# Patient Record
Sex: Female | Born: 1945 | Race: White | Hispanic: No | Smoking: Never smoker
Health system: Southern US, Community
[De-identification: ages and names within clinical notes are randomized; demographics above are authoritative.]

## PROBLEM LIST (undated history)

## (undated) DIAGNOSIS — K219 Gastro-esophageal reflux disease without esophagitis: Secondary | ICD-10-CM

## (undated) DIAGNOSIS — E7800 Pure hypercholesterolemia, unspecified: Secondary | ICD-10-CM

## (undated) DIAGNOSIS — E119 Type 2 diabetes mellitus without complications: Secondary | ICD-10-CM

## (undated) DIAGNOSIS — I10 Essential (primary) hypertension: Secondary | ICD-10-CM

## (undated) DIAGNOSIS — J309 Allergic rhinitis, unspecified: Secondary | ICD-10-CM

## (undated) HISTORY — DX: Gastro-esophageal reflux disease without esophagitis: K21.9

## (undated) HISTORY — DX: Pure hypercholesterolemia, unspecified: E78.00

## (undated) HISTORY — DX: Allergic rhinitis, unspecified: J30.9

## (undated) HISTORY — DX: Type 2 diabetes mellitus without complications: E11.9

## (undated) HISTORY — DX: Essential (primary) hypertension: I10

---

## 1973-02-13 HISTORY — PX: TUBAL LIGATION: SHX77

## 1990-02-13 HISTORY — PX: GALLBLADDER SURGERY: SHX652

## 1998-01-18 ENCOUNTER — Other Ambulatory Visit: Admission: RE | Admit: 1998-01-18 | Discharge: 1998-01-18 | Payer: Self-pay | Admitting: Gynecology

## 1999-01-13 ENCOUNTER — Other Ambulatory Visit: Admission: RE | Admit: 1999-01-13 | Discharge: 1999-01-13 | Payer: Self-pay | Admitting: Gynecology

## 2000-01-16 ENCOUNTER — Other Ambulatory Visit: Admission: RE | Admit: 2000-01-16 | Discharge: 2000-01-16 | Payer: Self-pay | Admitting: Gynecology

## 2001-02-19 ENCOUNTER — Other Ambulatory Visit: Admission: RE | Admit: 2001-02-19 | Discharge: 2001-02-19 | Payer: Self-pay | Admitting: Gynecology

## 2002-03-04 ENCOUNTER — Other Ambulatory Visit: Admission: RE | Admit: 2002-03-04 | Discharge: 2002-03-04 | Payer: Self-pay | Admitting: Gynecology

## 2003-03-03 ENCOUNTER — Other Ambulatory Visit: Admission: RE | Admit: 2003-03-03 | Discharge: 2003-03-03 | Payer: Self-pay | Admitting: Gynecology

## 2004-03-03 ENCOUNTER — Other Ambulatory Visit: Admission: RE | Admit: 2004-03-03 | Discharge: 2004-03-03 | Payer: Self-pay | Admitting: Gynecology

## 2005-03-13 ENCOUNTER — Other Ambulatory Visit: Admission: RE | Admit: 2005-03-13 | Discharge: 2005-03-13 | Payer: Self-pay | Admitting: Gynecology

## 2009-02-13 HISTORY — PX: KIDNEY STONE SURGERY: SHX686

## 2015-02-24 DIAGNOSIS — I10 Essential (primary) hypertension: Secondary | ICD-10-CM | POA: Diagnosis not present

## 2015-02-24 DIAGNOSIS — E1165 Type 2 diabetes mellitus with hyperglycemia: Secondary | ICD-10-CM | POA: Diagnosis not present

## 2015-02-24 DIAGNOSIS — J3089 Other allergic rhinitis: Secondary | ICD-10-CM | POA: Diagnosis not present

## 2015-02-24 DIAGNOSIS — E785 Hyperlipidemia, unspecified: Secondary | ICD-10-CM | POA: Diagnosis not present

## 2015-03-23 DIAGNOSIS — R6889 Other general symptoms and signs: Secondary | ICD-10-CM | POA: Diagnosis not present

## 2015-04-02 DIAGNOSIS — G4733 Obstructive sleep apnea (adult) (pediatric): Secondary | ICD-10-CM | POA: Diagnosis not present

## 2015-04-07 DIAGNOSIS — R1110 Vomiting, unspecified: Secondary | ICD-10-CM | POA: Diagnosis not present

## 2015-04-07 DIAGNOSIS — E860 Dehydration: Secondary | ICD-10-CM | POA: Diagnosis not present

## 2015-05-21 DIAGNOSIS — R109 Unspecified abdominal pain: Secondary | ICD-10-CM | POA: Diagnosis not present

## 2015-05-21 DIAGNOSIS — R3989 Other symptoms and signs involving the genitourinary system: Secondary | ICD-10-CM | POA: Diagnosis not present

## 2015-05-21 DIAGNOSIS — N390 Urinary tract infection, site not specified: Secondary | ICD-10-CM | POA: Diagnosis not present

## 2015-05-25 DIAGNOSIS — K5730 Diverticulosis of large intestine without perforation or abscess without bleeding: Secondary | ICD-10-CM | POA: Diagnosis not present

## 2015-05-25 DIAGNOSIS — R1030 Lower abdominal pain, unspecified: Secondary | ICD-10-CM | POA: Diagnosis not present

## 2015-05-25 DIAGNOSIS — I10 Essential (primary) hypertension: Secondary | ICD-10-CM | POA: Diagnosis not present

## 2015-05-25 DIAGNOSIS — E1165 Type 2 diabetes mellitus with hyperglycemia: Secondary | ICD-10-CM | POA: Diagnosis not present

## 2015-05-25 DIAGNOSIS — K439 Ventral hernia without obstruction or gangrene: Secondary | ICD-10-CM | POA: Diagnosis not present

## 2015-05-25 DIAGNOSIS — E785 Hyperlipidemia, unspecified: Secondary | ICD-10-CM | POA: Diagnosis not present

## 2015-05-25 DIAGNOSIS — E790 Hyperuricemia without signs of inflammatory arthritis and tophaceous disease: Secondary | ICD-10-CM | POA: Diagnosis not present

## 2015-05-25 DIAGNOSIS — K5732 Diverticulitis of large intestine without perforation or abscess without bleeding: Secondary | ICD-10-CM | POA: Diagnosis not present

## 2015-05-25 DIAGNOSIS — N200 Calculus of kidney: Secondary | ICD-10-CM | POA: Diagnosis not present

## 2015-05-25 DIAGNOSIS — K760 Fatty (change of) liver, not elsewhere classified: Secondary | ICD-10-CM | POA: Diagnosis not present

## 2015-06-08 DIAGNOSIS — K5730 Diverticulosis of large intestine without perforation or abscess without bleeding: Secondary | ICD-10-CM | POA: Diagnosis not present

## 2015-06-23 DIAGNOSIS — G4733 Obstructive sleep apnea (adult) (pediatric): Secondary | ICD-10-CM | POA: Diagnosis not present

## 2015-06-29 DIAGNOSIS — Z8719 Personal history of other diseases of the digestive system: Secondary | ICD-10-CM | POA: Diagnosis not present

## 2015-07-15 ENCOUNTER — Encounter: Payer: Self-pay | Admitting: Gastroenterology

## 2015-07-15 DIAGNOSIS — I129 Hypertensive chronic kidney disease with stage 1 through stage 4 chronic kidney disease, or unspecified chronic kidney disease: Secondary | ICD-10-CM | POA: Diagnosis not present

## 2015-07-15 DIAGNOSIS — G4700 Insomnia, unspecified: Secondary | ICD-10-CM | POA: Diagnosis not present

## 2015-07-15 DIAGNOSIS — D124 Benign neoplasm of descending colon: Secondary | ICD-10-CM | POA: Diagnosis not present

## 2015-07-15 DIAGNOSIS — Z8719 Personal history of other diseases of the digestive system: Secondary | ICD-10-CM | POA: Diagnosis not present

## 2015-07-15 DIAGNOSIS — R933 Abnormal findings on diagnostic imaging of other parts of digestive tract: Secondary | ICD-10-CM | POA: Diagnosis not present

## 2015-07-15 DIAGNOSIS — Z9049 Acquired absence of other specified parts of digestive tract: Secondary | ICD-10-CM | POA: Diagnosis not present

## 2015-07-15 DIAGNOSIS — G4733 Obstructive sleep apnea (adult) (pediatric): Secondary | ICD-10-CM | POA: Diagnosis not present

## 2015-07-15 DIAGNOSIS — E785 Hyperlipidemia, unspecified: Secondary | ICD-10-CM | POA: Diagnosis not present

## 2015-07-15 DIAGNOSIS — Z79899 Other long term (current) drug therapy: Secondary | ICD-10-CM | POA: Diagnosis not present

## 2015-07-15 DIAGNOSIS — N189 Chronic kidney disease, unspecified: Secondary | ICD-10-CM | POA: Diagnosis not present

## 2015-07-15 DIAGNOSIS — K219 Gastro-esophageal reflux disease without esophagitis: Secondary | ICD-10-CM | POA: Diagnosis not present

## 2015-07-15 DIAGNOSIS — K5730 Diverticulosis of large intestine without perforation or abscess without bleeding: Secondary | ICD-10-CM | POA: Diagnosis not present

## 2015-07-15 DIAGNOSIS — Z87891 Personal history of nicotine dependence: Secondary | ICD-10-CM | POA: Diagnosis not present

## 2015-07-15 DIAGNOSIS — Z1211 Encounter for screening for malignant neoplasm of colon: Secondary | ICD-10-CM | POA: Diagnosis not present

## 2015-07-15 DIAGNOSIS — K635 Polyp of colon: Secondary | ICD-10-CM | POA: Diagnosis not present

## 2015-07-15 DIAGNOSIS — K648 Other hemorrhoids: Secondary | ICD-10-CM | POA: Diagnosis not present

## 2015-07-15 DIAGNOSIS — D127 Benign neoplasm of rectosigmoid junction: Secondary | ICD-10-CM | POA: Diagnosis not present

## 2015-07-15 DIAGNOSIS — J45909 Unspecified asthma, uncomplicated: Secondary | ICD-10-CM | POA: Diagnosis not present

## 2015-07-15 DIAGNOSIS — E119 Type 2 diabetes mellitus without complications: Secondary | ICD-10-CM | POA: Diagnosis not present

## 2015-07-15 DIAGNOSIS — E538 Deficiency of other specified B group vitamins: Secondary | ICD-10-CM | POA: Diagnosis not present

## 2015-07-15 DIAGNOSIS — Z7984 Long term (current) use of oral hypoglycemic drugs: Secondary | ICD-10-CM | POA: Diagnosis not present

## 2015-07-15 DIAGNOSIS — D122 Benign neoplasm of ascending colon: Secondary | ICD-10-CM | POA: Diagnosis not present

## 2015-08-04 DIAGNOSIS — E119 Type 2 diabetes mellitus without complications: Secondary | ICD-10-CM | POA: Diagnosis not present

## 2015-08-04 DIAGNOSIS — Z7984 Long term (current) use of oral hypoglycemic drugs: Secondary | ICD-10-CM | POA: Diagnosis not present

## 2015-08-04 DIAGNOSIS — H2513 Age-related nuclear cataract, bilateral: Secondary | ICD-10-CM | POA: Diagnosis not present

## 2015-09-28 DIAGNOSIS — Z79899 Other long term (current) drug therapy: Secondary | ICD-10-CM | POA: Diagnosis not present

## 2015-09-28 DIAGNOSIS — E785 Hyperlipidemia, unspecified: Secondary | ICD-10-CM | POA: Diagnosis not present

## 2015-09-28 DIAGNOSIS — N200 Calculus of kidney: Secondary | ICD-10-CM | POA: Diagnosis not present

## 2015-09-28 DIAGNOSIS — K219 Gastro-esophageal reflux disease without esophagitis: Secondary | ICD-10-CM | POA: Diagnosis not present

## 2015-09-28 DIAGNOSIS — M549 Dorsalgia, unspecified: Secondary | ICD-10-CM | POA: Diagnosis not present

## 2015-09-28 DIAGNOSIS — I10 Essential (primary) hypertension: Secondary | ICD-10-CM | POA: Diagnosis not present

## 2015-09-28 DIAGNOSIS — E1165 Type 2 diabetes mellitus with hyperglycemia: Secondary | ICD-10-CM | POA: Diagnosis not present

## 2015-11-04 DIAGNOSIS — F5101 Primary insomnia: Secondary | ICD-10-CM | POA: Diagnosis not present

## 2015-11-04 DIAGNOSIS — M549 Dorsalgia, unspecified: Secondary | ICD-10-CM | POA: Diagnosis not present

## 2015-11-04 DIAGNOSIS — Z79899 Other long term (current) drug therapy: Secondary | ICD-10-CM | POA: Diagnosis not present

## 2015-11-25 DIAGNOSIS — Z23 Encounter for immunization: Secondary | ICD-10-CM | POA: Diagnosis not present

## 2015-11-25 DIAGNOSIS — Z79899 Other long term (current) drug therapy: Secondary | ICD-10-CM | POA: Diagnosis not present

## 2015-12-16 DIAGNOSIS — M549 Dorsalgia, unspecified: Secondary | ICD-10-CM | POA: Diagnosis not present

## 2015-12-16 DIAGNOSIS — N189 Chronic kidney disease, unspecified: Secondary | ICD-10-CM | POA: Diagnosis not present

## 2015-12-31 DIAGNOSIS — G4733 Obstructive sleep apnea (adult) (pediatric): Secondary | ICD-10-CM | POA: Diagnosis not present

## 2016-02-24 DIAGNOSIS — J019 Acute sinusitis, unspecified: Secondary | ICD-10-CM | POA: Diagnosis not present

## 2016-03-15 DIAGNOSIS — R69 Illness, unspecified: Secondary | ICD-10-CM | POA: Diagnosis not present

## 2016-03-22 DIAGNOSIS — G4733 Obstructive sleep apnea (adult) (pediatric): Secondary | ICD-10-CM | POA: Diagnosis not present

## 2016-03-28 DIAGNOSIS — J309 Allergic rhinitis, unspecified: Secondary | ICD-10-CM | POA: Diagnosis not present

## 2016-03-28 DIAGNOSIS — R05 Cough: Secondary | ICD-10-CM | POA: Diagnosis not present

## 2016-03-28 DIAGNOSIS — K219 Gastro-esophageal reflux disease without esophagitis: Secondary | ICD-10-CM | POA: Diagnosis not present

## 2016-03-28 DIAGNOSIS — I10 Essential (primary) hypertension: Secondary | ICD-10-CM | POA: Diagnosis not present

## 2016-03-28 DIAGNOSIS — Z79899 Other long term (current) drug therapy: Secondary | ICD-10-CM | POA: Diagnosis not present

## 2016-03-28 DIAGNOSIS — E785 Hyperlipidemia, unspecified: Secondary | ICD-10-CM | POA: Diagnosis not present

## 2016-03-28 DIAGNOSIS — E790 Hyperuricemia without signs of inflammatory arthritis and tophaceous disease: Secondary | ICD-10-CM | POA: Diagnosis not present

## 2016-03-28 DIAGNOSIS — E538 Deficiency of other specified B group vitamins: Secondary | ICD-10-CM | POA: Diagnosis not present

## 2016-03-28 DIAGNOSIS — E1165 Type 2 diabetes mellitus with hyperglycemia: Secondary | ICD-10-CM | POA: Diagnosis not present

## 2016-04-04 DIAGNOSIS — R0981 Nasal congestion: Secondary | ICD-10-CM | POA: Diagnosis not present

## 2016-04-04 DIAGNOSIS — M549 Dorsalgia, unspecified: Secondary | ICD-10-CM | POA: Diagnosis not present

## 2016-04-04 DIAGNOSIS — R490 Dysphonia: Secondary | ICD-10-CM | POA: Diagnosis not present

## 2016-04-27 DIAGNOSIS — R69 Illness, unspecified: Secondary | ICD-10-CM | POA: Diagnosis not present

## 2016-05-02 DIAGNOSIS — N289 Disorder of kidney and ureter, unspecified: Secondary | ICD-10-CM | POA: Diagnosis not present

## 2016-05-02 DIAGNOSIS — I10 Essential (primary) hypertension: Secondary | ICD-10-CM | POA: Diagnosis not present

## 2016-05-02 DIAGNOSIS — J309 Allergic rhinitis, unspecified: Secondary | ICD-10-CM | POA: Diagnosis not present

## 2016-05-02 DIAGNOSIS — E1165 Type 2 diabetes mellitus with hyperglycemia: Secondary | ICD-10-CM | POA: Diagnosis not present

## 2016-05-02 DIAGNOSIS — E785 Hyperlipidemia, unspecified: Secondary | ICD-10-CM | POA: Diagnosis not present

## 2016-05-02 DIAGNOSIS — R69 Illness, unspecified: Secondary | ICD-10-CM | POA: Diagnosis not present

## 2016-05-25 DIAGNOSIS — R69 Illness, unspecified: Secondary | ICD-10-CM | POA: Diagnosis not present

## 2016-05-25 DIAGNOSIS — K5792 Diverticulitis of intestine, part unspecified, without perforation or abscess without bleeding: Secondary | ICD-10-CM | POA: Diagnosis not present

## 2016-06-05 DIAGNOSIS — R69 Illness, unspecified: Secondary | ICD-10-CM | POA: Diagnosis not present

## 2016-06-06 DIAGNOSIS — M549 Dorsalgia, unspecified: Secondary | ICD-10-CM | POA: Insufficient documentation

## 2016-06-10 DIAGNOSIS — M4306 Spondylolysis, lumbar region: Secondary | ICD-10-CM | POA: Diagnosis not present

## 2016-06-10 DIAGNOSIS — M5442 Lumbago with sciatica, left side: Secondary | ICD-10-CM | POA: Diagnosis not present

## 2016-06-10 DIAGNOSIS — M25552 Pain in left hip: Secondary | ICD-10-CM | POA: Diagnosis not present

## 2016-06-10 DIAGNOSIS — M5430 Sciatica, unspecified side: Secondary | ICD-10-CM | POA: Diagnosis not present

## 2016-06-10 DIAGNOSIS — M545 Low back pain: Secondary | ICD-10-CM | POA: Diagnosis not present

## 2016-06-12 DIAGNOSIS — R69 Illness, unspecified: Secondary | ICD-10-CM | POA: Diagnosis not present

## 2016-06-20 DIAGNOSIS — M5136 Other intervertebral disc degeneration, lumbar region: Secondary | ICD-10-CM | POA: Insufficient documentation

## 2016-06-20 DIAGNOSIS — M4317 Spondylolisthesis, lumbosacral region: Secondary | ICD-10-CM | POA: Insufficient documentation

## 2016-06-20 DIAGNOSIS — M47816 Spondylosis without myelopathy or radiculopathy, lumbar region: Secondary | ICD-10-CM | POA: Diagnosis not present

## 2016-06-20 DIAGNOSIS — M5416 Radiculopathy, lumbar region: Secondary | ICD-10-CM | POA: Diagnosis not present

## 2016-06-20 DIAGNOSIS — Z6841 Body Mass Index (BMI) 40.0 and over, adult: Secondary | ICD-10-CM | POA: Diagnosis not present

## 2016-06-20 DIAGNOSIS — M549 Dorsalgia, unspecified: Secondary | ICD-10-CM | POA: Diagnosis not present

## 2016-06-26 ENCOUNTER — Other Ambulatory Visit: Payer: Self-pay | Admitting: Neurosurgery

## 2016-06-26 DIAGNOSIS — M5416 Radiculopathy, lumbar region: Secondary | ICD-10-CM

## 2016-07-01 ENCOUNTER — Ambulatory Visit
Admission: RE | Admit: 2016-07-01 | Discharge: 2016-07-01 | Disposition: A | Discharge status: Home / Self Care | Payer: Medicare HMO | Source: Ambulatory Visit | Attending: Neurosurgery | Admitting: Neurosurgery

## 2016-07-01 DIAGNOSIS — M5416 Radiculopathy, lumbar region: Secondary | ICD-10-CM

## 2016-07-01 DIAGNOSIS — M47817 Spondylosis without myelopathy or radiculopathy, lumbosacral region: Secondary | ICD-10-CM | POA: Diagnosis not present

## 2016-07-03 DIAGNOSIS — H9202 Otalgia, left ear: Secondary | ICD-10-CM | POA: Diagnosis not present

## 2016-07-03 DIAGNOSIS — J029 Acute pharyngitis, unspecified: Secondary | ICD-10-CM | POA: Diagnosis not present

## 2016-07-12 DIAGNOSIS — M5416 Radiculopathy, lumbar region: Secondary | ICD-10-CM | POA: Diagnosis not present

## 2016-07-12 DIAGNOSIS — M47816 Spondylosis without myelopathy or radiculopathy, lumbar region: Secondary | ICD-10-CM | POA: Diagnosis not present

## 2016-07-12 DIAGNOSIS — M5136 Other intervertebral disc degeneration, lumbar region: Secondary | ICD-10-CM | POA: Diagnosis not present

## 2016-07-12 DIAGNOSIS — Z6841 Body Mass Index (BMI) 40.0 and over, adult: Secondary | ICD-10-CM | POA: Diagnosis not present

## 2016-07-12 DIAGNOSIS — M4317 Spondylolisthesis, lumbosacral region: Secondary | ICD-10-CM | POA: Diagnosis not present

## 2016-07-21 DIAGNOSIS — R69 Illness, unspecified: Secondary | ICD-10-CM | POA: Diagnosis not present

## 2016-07-24 DIAGNOSIS — G4733 Obstructive sleep apnea (adult) (pediatric): Secondary | ICD-10-CM | POA: Diagnosis not present

## 2016-08-29 DIAGNOSIS — R69 Illness, unspecified: Secondary | ICD-10-CM | POA: Diagnosis not present

## 2016-09-05 DIAGNOSIS — E785 Hyperlipidemia, unspecified: Secondary | ICD-10-CM | POA: Diagnosis not present

## 2016-09-05 DIAGNOSIS — K5730 Diverticulosis of large intestine without perforation or abscess without bleeding: Secondary | ICD-10-CM | POA: Diagnosis not present

## 2016-09-05 DIAGNOSIS — E1165 Type 2 diabetes mellitus with hyperglycemia: Secondary | ICD-10-CM | POA: Diagnosis not present

## 2016-09-05 DIAGNOSIS — N200 Calculus of kidney: Secondary | ICD-10-CM | POA: Diagnosis not present

## 2016-09-05 DIAGNOSIS — I10 Essential (primary) hypertension: Secondary | ICD-10-CM | POA: Diagnosis not present

## 2016-09-05 DIAGNOSIS — K219 Gastro-esophageal reflux disease without esophagitis: Secondary | ICD-10-CM | POA: Diagnosis not present

## 2016-09-05 DIAGNOSIS — J3089 Other allergic rhinitis: Secondary | ICD-10-CM | POA: Diagnosis not present

## 2016-09-05 DIAGNOSIS — Z79899 Other long term (current) drug therapy: Secondary | ICD-10-CM | POA: Diagnosis not present

## 2016-09-05 DIAGNOSIS — R69 Illness, unspecified: Secondary | ICD-10-CM | POA: Diagnosis not present

## 2016-09-13 DIAGNOSIS — H52223 Regular astigmatism, bilateral: Secondary | ICD-10-CM | POA: Diagnosis not present

## 2016-09-19 DIAGNOSIS — E781 Pure hyperglyceridemia: Secondary | ICD-10-CM | POA: Diagnosis not present

## 2016-09-19 DIAGNOSIS — R600 Localized edema: Secondary | ICD-10-CM | POA: Diagnosis not present

## 2016-09-19 DIAGNOSIS — D649 Anemia, unspecified: Secondary | ICD-10-CM | POA: Diagnosis not present

## 2016-09-19 DIAGNOSIS — M79604 Pain in right leg: Secondary | ICD-10-CM | POA: Diagnosis not present

## 2016-09-19 DIAGNOSIS — M25561 Pain in right knee: Secondary | ICD-10-CM | POA: Diagnosis not present

## 2016-09-19 DIAGNOSIS — M179 Osteoarthritis of knee, unspecified: Secondary | ICD-10-CM | POA: Diagnosis not present

## 2016-09-19 DIAGNOSIS — N189 Chronic kidney disease, unspecified: Secondary | ICD-10-CM | POA: Diagnosis not present

## 2016-09-20 DIAGNOSIS — E1165 Type 2 diabetes mellitus with hyperglycemia: Secondary | ICD-10-CM | POA: Diagnosis not present

## 2016-09-20 DIAGNOSIS — D509 Iron deficiency anemia, unspecified: Secondary | ICD-10-CM | POA: Diagnosis not present

## 2016-09-26 DIAGNOSIS — R69 Illness, unspecified: Secondary | ICD-10-CM | POA: Diagnosis not present

## 2016-09-28 DIAGNOSIS — M1711 Unilateral primary osteoarthritis, right knee: Secondary | ICD-10-CM | POA: Diagnosis not present

## 2016-09-28 DIAGNOSIS — E538 Deficiency of other specified B group vitamins: Secondary | ICD-10-CM | POA: Diagnosis not present

## 2016-09-28 DIAGNOSIS — L97522 Non-pressure chronic ulcer of other part of left foot with fat layer exposed: Secondary | ICD-10-CM | POA: Diagnosis not present

## 2016-09-28 DIAGNOSIS — Z87442 Personal history of urinary calculi: Secondary | ICD-10-CM | POA: Diagnosis not present

## 2016-09-28 DIAGNOSIS — E11621 Type 2 diabetes mellitus with foot ulcer: Secondary | ICD-10-CM | POA: Diagnosis not present

## 2016-09-28 DIAGNOSIS — I10 Essential (primary) hypertension: Secondary | ICD-10-CM | POA: Diagnosis not present

## 2016-09-28 DIAGNOSIS — M1990 Unspecified osteoarthritis, unspecified site: Secondary | ICD-10-CM | POA: Diagnosis not present

## 2016-10-05 DIAGNOSIS — E11621 Type 2 diabetes mellitus with foot ulcer: Secondary | ICD-10-CM | POA: Diagnosis not present

## 2016-10-05 DIAGNOSIS — L97529 Non-pressure chronic ulcer of other part of left foot with unspecified severity: Secondary | ICD-10-CM | POA: Diagnosis not present

## 2016-10-17 DIAGNOSIS — J3089 Other allergic rhinitis: Secondary | ICD-10-CM | POA: Diagnosis not present

## 2016-10-17 DIAGNOSIS — J329 Chronic sinusitis, unspecified: Secondary | ICD-10-CM | POA: Diagnosis not present

## 2016-10-26 DIAGNOSIS — G4733 Obstructive sleep apnea (adult) (pediatric): Secondary | ICD-10-CM | POA: Diagnosis not present

## 2016-11-02 DIAGNOSIS — R69 Illness, unspecified: Secondary | ICD-10-CM | POA: Diagnosis not present

## 2016-11-08 DIAGNOSIS — R69 Illness, unspecified: Secondary | ICD-10-CM | POA: Diagnosis not present

## 2016-12-06 DIAGNOSIS — R69 Illness, unspecified: Secondary | ICD-10-CM | POA: Diagnosis not present

## 2016-12-06 DIAGNOSIS — I10 Essential (primary) hypertension: Secondary | ICD-10-CM | POA: Diagnosis not present

## 2016-12-06 DIAGNOSIS — E1165 Type 2 diabetes mellitus with hyperglycemia: Secondary | ICD-10-CM | POA: Diagnosis not present

## 2016-12-06 DIAGNOSIS — R05 Cough: Secondary | ICD-10-CM | POA: Diagnosis not present

## 2016-12-06 DIAGNOSIS — N183 Chronic kidney disease, stage 3 (moderate): Secondary | ICD-10-CM | POA: Diagnosis not present

## 2017-01-02 DIAGNOSIS — E1165 Type 2 diabetes mellitus with hyperglycemia: Secondary | ICD-10-CM | POA: Diagnosis not present

## 2017-01-02 DIAGNOSIS — I10 Essential (primary) hypertension: Secondary | ICD-10-CM | POA: Diagnosis not present

## 2017-01-02 DIAGNOSIS — M549 Dorsalgia, unspecified: Secondary | ICD-10-CM | POA: Diagnosis not present

## 2017-01-10 DIAGNOSIS — R69 Illness, unspecified: Secondary | ICD-10-CM | POA: Diagnosis not present

## 2017-01-16 DIAGNOSIS — I10 Essential (primary) hypertension: Secondary | ICD-10-CM | POA: Diagnosis not present

## 2017-01-16 DIAGNOSIS — E1165 Type 2 diabetes mellitus with hyperglycemia: Secondary | ICD-10-CM | POA: Diagnosis not present

## 2017-01-16 DIAGNOSIS — Z79899 Other long term (current) drug therapy: Secondary | ICD-10-CM | POA: Diagnosis not present

## 2017-01-16 DIAGNOSIS — N200 Calculus of kidney: Secondary | ICD-10-CM | POA: Diagnosis not present

## 2017-01-16 DIAGNOSIS — E785 Hyperlipidemia, unspecified: Secondary | ICD-10-CM | POA: Diagnosis not present

## 2017-01-16 DIAGNOSIS — J329 Chronic sinusitis, unspecified: Secondary | ICD-10-CM | POA: Diagnosis not present

## 2017-01-16 DIAGNOSIS — D509 Iron deficiency anemia, unspecified: Secondary | ICD-10-CM | POA: Diagnosis not present

## 2017-01-16 DIAGNOSIS — N183 Chronic kidney disease, stage 3 (moderate): Secondary | ICD-10-CM | POA: Diagnosis not present

## 2017-01-16 DIAGNOSIS — K219 Gastro-esophageal reflux disease without esophagitis: Secondary | ICD-10-CM | POA: Diagnosis not present

## 2017-02-19 ENCOUNTER — Ambulatory Visit (INDEPENDENT_AMBULATORY_CARE_PROVIDER_SITE_OTHER): Payer: Medicare HMO | Admitting: Allergy

## 2017-02-19 ENCOUNTER — Encounter: Payer: Self-pay | Admitting: Allergy

## 2017-02-19 VITALS — BP 140/62 | HR 68 | Temp 98.3°F | Resp 20 | Ht 64.0 in | Wt 276.0 lb

## 2017-02-19 DIAGNOSIS — K219 Gastro-esophageal reflux disease without esophagitis: Secondary | ICD-10-CM

## 2017-02-19 DIAGNOSIS — J3089 Other allergic rhinitis: Secondary | ICD-10-CM | POA: Diagnosis not present

## 2017-02-19 DIAGNOSIS — R062 Wheezing: Secondary | ICD-10-CM

## 2017-02-19 MED ORDER — OLOPATADINE HCL 0.7 % OP SOLN: 1.0000 [drp] | OPHTHALMIC | 5 refills | Status: DC

## 2017-02-19 MED ORDER — ALBUTEROL SULFATE HFA 108 (90 BASE) MCG/ACT IN AERS: 2.0000 | INHALATION_SPRAY | RESPIRATORY_TRACT | 3 refills | Status: DC | PRN

## 2017-02-19 NOTE — Patient Instructions (Addendum)
-  environmental skin testing today is negative.  Will obtain serum IgE levels to complete allergy testing.  We will call you once the lab returns.    - trial dymista 1 spray each nostril twice a day.  This is a combination nasal spray with Flonase + Astelin (nasal antihistamine).  This helps with both nasal congestion and drainage.   - trial Xyzal 5mg  daily.  This is an antihistamine.   - Pazeo 1 drop each eye as needed daily for itchy/watery/red eyes  - have access to albuterol inhaler 2 puffs every 4-6 hours as needed for cough/wheeze/shortness of breath/chest tightness.  May use 15-20 minutes prior to activity.   Monitor frequency of use.    - continue Montelukast 10mg  daily  - may use Mucinex 600-1200mg  daily as needed to help thin/loosen mucus  - continue prilosec daily  Follow-up 2-3 months or sooner if needed

## 2017-02-19 NOTE — Progress Notes (Signed)
New Patient Note  RE: Lauren Webb MRN: [id number] DOB: 10/23/1945 Date of Office Visit: 02/19/2017  Referring provider: No ref. provider found Primary care provider: Ernestene Kiel, MD  Chief Complaint: Cough, nasal congestion, itchy watery eyes and sneezing  History of present illness: Lauren Webb is a 72 y.o. female presenting today for evaluation of allergy symptoms.  She is a former patient of Dr. Carmelina Peal with her last visit being in 2012.  She returns today due to concern for her allergy symptoms.  She recalls being allergic to mold and some other allergens that she can not recall when she had previous testing with [place].  She reports she was recommended to undergo allregy shots at that time but she did not do this.  She states she was doing relatively well up until the past several months as her symptoms have been worsening.  She has been having coughing, nasal congestion and drainage, ithcy watery eyes and sneezing, PND.  She reports these symptoms are year-round.   She denies a history of asthma but does report wheezing when she lays down.  She does states she has been prescribed an albuterol inhaler many years ago during an illness.   She has been on claritin, allegra, zyrtec, singulair in the past.  Currently on singulair now for the past year; she does not feel it is working.  She does state when she first started taking it she noticed some improvements in symptom control. She uses nasal sprays as needed and has used flonase before.  She is unsure which nasal spray she has at home.  She has been taking Mucinex for the past week and does feel like that is helping to move the mucus.  She denies any fevers and no sick contacts.   She did see her PCP about 2 months ago and was prescribed a 21-day antibiotic course which she does not feel made for differences.  She takes prilosec daily.    She denies any concern for food allergy and has no history of eczema.  Review of  systems: Review of Systems  Constitutional: Negative for chills, fever and malaise/fatigue.  HENT: Positive for congestion and sore throat. Negative for ear discharge, ear pain, nosebleeds and sinus pain.   Eyes: Negative for pain, discharge and redness.  Respiratory: Positive for cough, shortness of breath and wheezing. Negative for hemoptysis and sputum production.   Cardiovascular: Negative for chest pain.  Gastrointestinal: Negative for abdominal pain, constipation, diarrhea, heartburn, nausea and vomiting.  Musculoskeletal: Negative for joint pain and myalgias.  Skin: Negative for itching and rash.  Neurological: Negative for headaches.    All other systems negative unless noted above in HPI  Past medical history: Past Medical History:  Diagnosis Date  . Allergic rhinitis   . Diabetes ([place])   . GERD (gastroesophageal reflux disease)   . High blood pressure   . High cholesterol     Past surgical history: Past Surgical History:  Procedure Laterality Date  . [place]  . KIDNEY STONE SURGERY  2011  . TUBAL LIGATION  1975    Family history:  Family History  Problem Relation Age of Onset  . Bone cancer Mother   . Cancer Brother   . Cancer Paternal Uncle   . Heart disease Maternal Grandmother   . Cancer Maternal Grandfather     Social history: She lives in a home without carpeting with gas heating.  There are no pets in the home.  There is no concern for water damage, mildew or roaches in the home.  She is retired.  She has no smoking history.   Medication List: Allergies as of 02/19/2017      Reactions   Avelox [moxifloxacin Hcl In Nacl] Other (See Comments)   weakness      Medication List        Accurate as of 02/19/17  1:28 PM. Always use your most recent med list.          albuterol 108 (90 Base) MCG/ACT inhaler Commonly known as:  PROAIR HFA Inhale 2 puffs into the lungs every 4 (four) hours as needed for wheezing or shortness of  breath.   allopurinol 100 MG tablet Commonly known as:  ZYLOPRIM Take 200 mg by mouth daily.   aspirin EC 81 MG tablet Take 81 mg by mouth daily.   furosemide 40 MG tablet Commonly known as:  LASIX Take 40 mg by mouth daily.   gemfibrozil 600 MG tablet Commonly known as:  LOPID Take 1,200 mg by mouth 2 (two) times daily before a meal.   glipiZIDE 10 MG tablet Commonly known as:  GLUCOTROL Take 20 mg by mouth 2 (two) times daily before a meal.   liraglutide 18 MG/3ML Sopn Commonly known as:  VICTOZA Inject into the skin as needed.   lisinopril 20 MG tablet Commonly known as:  PRINIVIL,ZESTRIL Take 20 mg by mouth daily.   montelukast 10 MG tablet Commonly known as:  SINGULAIR Take 10 mg by mouth at bedtime.   Olopatadine HCl 0.7 % Soln Commonly known as:  PAZEO Place 1 drop into both eyes 1 day or 1 dose.   OMEGA 3 PO Take 2 capsules by mouth 2 (two) times daily.   omeprazole 40 MG capsule Commonly known as:  PRILOSEC Take 40 mg by mouth daily.   ONE TOUCH ULTRA TEST test strip Generic drug:  glucose blood USE AS DIRECTED 4 TIMES A DAY   pravastatin 40 MG tablet Commonly known as:  PRAVACHOL Take 40 mg by mouth daily.   traZODone 50 MG tablet Commonly known as:  DESYREL Take 50 mg by mouth at bedtime.   VITAMIN B-12 PO Take 1 tablet by mouth daily.       Known medication allergies: Allergies  Allergen Reactions  . Avelox [Moxifloxacin Hcl In Nacl] Other (See Comments)    weakness     Physical examination: Blood pressure 140/62, pulse 68, temperature 98.3 F (36.8 C), temperature source Oral, resp. rate 20, height 5\' 4"  (1.626 m), weight 276 lb (125.2 kg).  General: Alert, interactive, in no acute distress. HEENT: PERRLA, TMs pearly gray, turbinates moderately edematous with clear discharge, post-pharynx non erythematous. Neck: Supple without lymphadenopathy. Lungs: Clear to auscultation without wheezing, rhonchi or rales. {no increased work  of breathing.  Coughing and throat clearing throughout the visit. CV: Normal S1, S2 without murmurs. Abdomen: Nondistended, nontender. Skin: Warm and dry, without lesions or rashes. Extremities:  No clubbing, cyanosis or edema. Neuro:   Grossly intact.  Diagnositics/Labs:  Spirometry: FEV1: 1.61L  76%, FVC: 1.73L  63% post DuoNeb she had a 50% increase in FEV1 to 1.85 L or 88% thus normalizing  Allergy testing: Environmental skin prick testing today is negative.  Histamine control was positive. Allergy testing results were read and interpreted by provider, documented by clinical staff.   Assessment and plan:   Allergic rhinoconjunctivitis -environmental skin testing today is negative.  Will obtain serum IgE levels to complete allergy  testing.  We will call you once the lab returns.   - trial dymista 1 spray each nostril twice a day.  This is a combination nasal spray with Flonase + Astelin (nasal antihistamine).  This helps with both nasal congestion and drainage.  - trial Xyzal 5mg  daily.  This is an antihistamine.  - Pazeo 1 drop each eye as needed daily for itchy/watery/red eyes - continue Montelukast 10mg  daily - may use Mucinex 600-1200mg  daily as needed to help thin/loosen mucus  Wheezing -Symptoms of cough, wheezing that is worse at night may represent asthma that may be triggered by allergies.   - have access to albuterol inhaler 2 puffs every 4-6 hours as needed for cough/wheeze/shortness of breath/chest tightness.  May use 15-20 minutes prior to activity.   Monitor frequency of use.   -Singulair as above  GERD - continue prilosec daily -Lifestyle change including reducing/eliminating foods that trigger symptoms as well as increase head of bed  Follow-up 2-3 months or sooner if needed  I appreciate the opportunity to take part in [facility] care. Please do not hesitate to contact me with questions.  Sincerely,   Prudy Feeler, MD Allergy/Immunology [facility] [place] [facility] [place]

## 2017-02-22 DIAGNOSIS — R69 Illness, unspecified: Secondary | ICD-10-CM | POA: Diagnosis not present

## 2017-02-24 LAB — ALLERGENS W/TOTAL IGE AREA 2
Aspergillus Fumigatus IgE: <0.1 kU/L
Cladosporium Herbarum IgE: <0.1 kU/L
Cockroach, German IgE: <0.1 kU/L
Common Silver Birch IgE: <0.1 kU/L
D Farinae IgE: <0.1 kU/L
IGE (IMMUNOGLOBULIN E), SERUM: 32 [IU]/mL (ref 0–100)
Johnson Grass IgE: <0.1 kU/L
Maple/Box Elder IgE: <0.1 kU/L
Mouse Urine IgE: <0.1 kU/L
Oak, White IgE: <0.1 kU/L
Pecan, Hickory IgE: <0.1 kU/L
Pigweed, Rough IgE: <0.1 kU/L
Sheep Sorrel IgE Qn: <0.1 kU/L
Timothy Grass IgE: <0.1 kU/L

## 2017-02-26 DIAGNOSIS — K219 Gastro-esophageal reflux disease without esophagitis: Secondary | ICD-10-CM | POA: Diagnosis not present

## 2017-02-26 DIAGNOSIS — Z6841 Body Mass Index (BMI) 40.0 and over, adult: Secondary | ICD-10-CM | POA: Diagnosis not present

## 2017-02-26 DIAGNOSIS — E1165 Type 2 diabetes mellitus with hyperglycemia: Secondary | ICD-10-CM | POA: Diagnosis not present

## 2017-02-26 DIAGNOSIS — E785 Hyperlipidemia, unspecified: Secondary | ICD-10-CM | POA: Diagnosis not present

## 2017-02-26 DIAGNOSIS — I10 Essential (primary) hypertension: Secondary | ICD-10-CM | POA: Diagnosis not present

## 2017-02-26 DIAGNOSIS — R69 Illness, unspecified: Secondary | ICD-10-CM | POA: Diagnosis not present

## 2017-02-26 DIAGNOSIS — J4520 Mild intermittent asthma, uncomplicated: Secondary | ICD-10-CM | POA: Diagnosis not present

## 2017-03-02 DIAGNOSIS — J329 Chronic sinusitis, unspecified: Secondary | ICD-10-CM | POA: Diagnosis not present

## 2017-03-06 ENCOUNTER — Ambulatory Visit: Payer: Self-pay | Admitting: Allergy

## 2017-03-15 ENCOUNTER — Other Ambulatory Visit: Payer: Self-pay

## 2017-03-15 DIAGNOSIS — J3089 Other allergic rhinitis: Secondary | ICD-10-CM

## 2017-03-24 DIAGNOSIS — R69 Illness, unspecified: Secondary | ICD-10-CM | POA: Diagnosis not present

## 2017-03-26 DIAGNOSIS — J343 Hypertrophy of nasal turbinates: Secondary | ICD-10-CM | POA: Diagnosis not present

## 2017-03-26 DIAGNOSIS — R0981 Nasal congestion: Secondary | ICD-10-CM | POA: Diagnosis not present

## 2017-03-26 DIAGNOSIS — J342 Deviated nasal septum: Secondary | ICD-10-CM | POA: Diagnosis not present

## 2017-03-26 DIAGNOSIS — J3489 Other specified disorders of nose and nasal sinuses: Secondary | ICD-10-CM | POA: Diagnosis not present

## 2017-03-26 DIAGNOSIS — D492 Neoplasm of unspecified behavior of bone, soft tissue, and skin: Secondary | ICD-10-CM | POA: Diagnosis not present

## 2017-03-26 DIAGNOSIS — J320 Chronic maxillary sinusitis: Secondary | ICD-10-CM | POA: Diagnosis not present

## 2017-03-26 DIAGNOSIS — I10 Essential (primary) hypertension: Secondary | ICD-10-CM | POA: Diagnosis not present

## 2017-03-26 DIAGNOSIS — J322 Chronic ethmoidal sinusitis: Secondary | ICD-10-CM | POA: Diagnosis not present

## 2017-03-26 DIAGNOSIS — J329 Chronic sinusitis, unspecified: Secondary | ICD-10-CM | POA: Diagnosis not present

## 2017-03-29 DIAGNOSIS — G4733 Obstructive sleep apnea (adult) (pediatric): Secondary | ICD-10-CM | POA: Diagnosis not present

## 2017-03-29 DIAGNOSIS — R9431 Abnormal electrocardiogram [ECG] [EKG]: Secondary | ICD-10-CM | POA: Diagnosis not present

## 2017-03-29 DIAGNOSIS — Z01818 Encounter for other preprocedural examination: Secondary | ICD-10-CM | POA: Diagnosis not present

## 2017-03-29 DIAGNOSIS — M545 Low back pain: Secondary | ICD-10-CM | POA: Diagnosis not present

## 2017-03-29 DIAGNOSIS — I10 Essential (primary) hypertension: Secondary | ICD-10-CM | POA: Diagnosis not present

## 2017-04-05 ENCOUNTER — Telehealth: Payer: Self-pay | Admitting: Allergy

## 2017-04-05 NOTE — Telephone Encounter (Signed)
Trinidy called in and stated the PAZEO eye drops were $149 and she couldn't afford it and would like to know if there is an alternate eye drop she can take?

## 2017-04-05 NOTE — Telephone Encounter (Signed)
Let's see if Patanol (1 drop twice day) or Pataday (1 drop once a day) is better covered.  If not can always try OTC Alaway or Zaditor.

## 2017-04-06 ENCOUNTER — Other Ambulatory Visit: Payer: Self-pay | Admitting: *Deleted

## 2017-04-06 MED ORDER — OLOPATADINE HCL 0.1 % OP SOLN: OPHTHALMIC | 5 refills | Status: DC

## 2017-04-06 NOTE — Telephone Encounter (Signed)
Called but unable to leave a message. I will send a script for generic patanol to see if it is covered/cheaper.

## 2017-04-16 DIAGNOSIS — I10 Essential (primary) hypertension: Secondary | ICD-10-CM | POA: Diagnosis not present

## 2017-04-16 DIAGNOSIS — Z01818 Encounter for other preprocedural examination: Secondary | ICD-10-CM | POA: Diagnosis not present

## 2017-04-16 DIAGNOSIS — R9431 Abnormal electrocardiogram [ECG] [EKG]: Secondary | ICD-10-CM | POA: Diagnosis not present

## 2017-05-08 ENCOUNTER — Ambulatory Visit: Payer: Medicare HMO | Admitting: Allergy

## 2017-05-28 DIAGNOSIS — N183 Chronic kidney disease, stage 3 (moderate): Secondary | ICD-10-CM | POA: Diagnosis not present

## 2017-05-28 DIAGNOSIS — E785 Hyperlipidemia, unspecified: Secondary | ICD-10-CM | POA: Diagnosis not present

## 2017-05-28 DIAGNOSIS — I10 Essential (primary) hypertension: Secondary | ICD-10-CM | POA: Diagnosis not present

## 2017-05-28 DIAGNOSIS — J4520 Mild intermittent asthma, uncomplicated: Secondary | ICD-10-CM | POA: Diagnosis not present

## 2017-05-28 DIAGNOSIS — E1165 Type 2 diabetes mellitus with hyperglycemia: Secondary | ICD-10-CM | POA: Diagnosis not present

## 2017-05-28 DIAGNOSIS — G4733 Obstructive sleep apnea (adult) (pediatric): Secondary | ICD-10-CM | POA: Diagnosis not present

## 2017-05-28 DIAGNOSIS — K219 Gastro-esophageal reflux disease without esophagitis: Secondary | ICD-10-CM | POA: Diagnosis not present

## 2017-05-30 DIAGNOSIS — R69 Illness, unspecified: Secondary | ICD-10-CM | POA: Diagnosis not present

## 2017-06-27 DIAGNOSIS — E785 Hyperlipidemia, unspecified: Secondary | ICD-10-CM | POA: Diagnosis not present

## 2017-06-27 DIAGNOSIS — N183 Chronic kidney disease, stage 3 (moderate): Secondary | ICD-10-CM | POA: Diagnosis not present

## 2017-06-27 DIAGNOSIS — Z79899 Other long term (current) drug therapy: Secondary | ICD-10-CM | POA: Diagnosis not present

## 2017-06-27 DIAGNOSIS — I10 Essential (primary) hypertension: Secondary | ICD-10-CM | POA: Diagnosis not present

## 2017-06-27 DIAGNOSIS — L989 Disorder of the skin and subcutaneous tissue, unspecified: Secondary | ICD-10-CM | POA: Diagnosis not present

## 2017-06-27 DIAGNOSIS — R69 Illness, unspecified: Secondary | ICD-10-CM | POA: Diagnosis not present

## 2017-06-27 DIAGNOSIS — E1165 Type 2 diabetes mellitus with hyperglycemia: Secondary | ICD-10-CM | POA: Diagnosis not present

## 2017-07-05 DIAGNOSIS — Z794 Long term (current) use of insulin: Secondary | ICD-10-CM | POA: Diagnosis not present

## 2017-07-05 DIAGNOSIS — L84 Corns and callosities: Secondary | ICD-10-CM | POA: Diagnosis not present

## 2017-07-05 DIAGNOSIS — L570 Actinic keratosis: Secondary | ICD-10-CM | POA: Diagnosis not present

## 2017-07-05 DIAGNOSIS — M1711 Unilateral primary osteoarthritis, right knee: Secondary | ICD-10-CM | POA: Diagnosis not present

## 2017-07-05 DIAGNOSIS — Z9989 Dependence on other enabling machines and devices: Secondary | ICD-10-CM | POA: Diagnosis not present

## 2017-07-05 DIAGNOSIS — G4730 Sleep apnea, unspecified: Secondary | ICD-10-CM | POA: Diagnosis not present

## 2017-07-05 DIAGNOSIS — I10 Essential (primary) hypertension: Secondary | ICD-10-CM | POA: Diagnosis not present

## 2017-07-11 DIAGNOSIS — G4733 Obstructive sleep apnea (adult) (pediatric): Secondary | ICD-10-CM | POA: Diagnosis not present

## 2017-07-13 ENCOUNTER — Telehealth: Payer: Self-pay | Admitting: Allergy

## 2017-07-13 MED ORDER — MONTELUKAST SODIUM 10 MG PO TABS: 10.0000 mg | ORAL_TABLET | Freq: Every day | ORAL | 5 refills | Status: DC

## 2017-07-13 MED ORDER — PREDNISONE 10 MG PO TABS: ORAL_TABLET | ORAL | 0 refills | Status: DC

## 2017-07-13 NOTE — Telephone Encounter (Signed)
Lauren Webb called in and stated she would like something called in for her breathing.  She states she is wheezing still even after taking all of her medications and following the action plan and didn't know if there was something that could be called in so she didn't have to come in.  Please advise.

## 2017-07-13 NOTE — Telephone Encounter (Signed)
Great sounds good.  Thanks Energy manager.

## 2017-07-13 NOTE — Telephone Encounter (Signed)
No answer at the number provided. I will try again later today.

## 2017-07-13 NOTE — Telephone Encounter (Signed)
Called patient at home. She reports dry hacking cough with some wheezing that improved with albuterol. She denies fever. She is using Dymista and Xyzal but has not tried montelukast yet. I will call in a low dose prednisone and montelukast. She agrees to continue using her Dymista, xyzal, and albuterol. She verbalized understanding of this plan and will call for fever or worsening of symptoms. She will call on Monday with a progress report and if we need to see her in the office.

## 2017-07-16 ENCOUNTER — Telehealth: Payer: Self-pay | Admitting: Allergy

## 2017-07-16 ENCOUNTER — Encounter: Payer: Self-pay | Admitting: Family Medicine

## 2017-07-16 MED ORDER — BENZONATATE 100 MG PO CAPS: 100.0000 mg | ORAL_CAPSULE | Freq: Three times a day (TID) | ORAL | 0 refills | Status: DC

## 2017-07-16 NOTE — Telephone Encounter (Signed)
Called in [place] and appointment made for tomorrow

## 2017-07-16 NOTE — Telephone Encounter (Signed)
Sherrin called in and stated she had to stop taking Prednisone because it was causing her blood sugar to become high.  Payten states when she called the pharmacy to ask about it, they told her to stop taking it.  Haillee would like to know if there is anything else that can be called in for her.  Temprance also states she is still taking all her other medication as advised.

## 2017-07-17 ENCOUNTER — Encounter: Payer: Self-pay | Admitting: Allergy

## 2017-07-17 ENCOUNTER — Ambulatory Visit (INDEPENDENT_AMBULATORY_CARE_PROVIDER_SITE_OTHER): Payer: Medicare HMO | Admitting: Allergy

## 2017-07-17 VITALS — BP 142/72 | HR 72 | Temp 98.2°F | Resp 20

## 2017-07-17 DIAGNOSIS — J3089 Other allergic rhinitis: Secondary | ICD-10-CM | POA: Diagnosis not present

## 2017-07-17 DIAGNOSIS — J4520 Mild intermittent asthma, uncomplicated: Secondary | ICD-10-CM | POA: Diagnosis not present

## 2017-07-17 DIAGNOSIS — K219 Gastro-esophageal reflux disease without esophagitis: Secondary | ICD-10-CM

## 2017-07-17 MED ORDER — FLUTICASONE PROPIONATE 50 MCG/ACT NA SUSP: NASAL | 5 refills | Status: DC

## 2017-07-17 MED ORDER — ALBUTEROL SULFATE HFA 108 (90 BASE) MCG/ACT IN AERS: INHALATION_SPRAY | RESPIRATORY_TRACT | 1 refills | Status: DC

## 2017-07-17 MED ORDER — BECLOMETHASONE DIPROP HFA 80 MCG/ACT IN AERB: INHALATION_SPRAY | RESPIRATORY_TRACT | 0 refills | Status: DC

## 2017-07-17 MED ORDER — AZELASTINE HCL 0.1 % NA SOLN: NASAL | 5 refills | Status: DC

## 2017-07-17 NOTE — Patient Instructions (Addendum)
-  environmental allergy testing has been negative.    - you have had improvement in nasal symptoms (post-nasal drainage) with use of dymista.  Use dymista 1 spray each nostril twice a day.  This is a combination nasal spray with Flonase + Astelin (nasal antihistamine).  This helps with both nasal congestion and drainage.   We have provided samples today.    We will prescribe Astelin separately as a prescription to use once you run out of Dymista.    - continue Xyzal 5mg  daily.  This is a long-acting antihistamine.   - Pazeo 1 drop each eye as needed daily for itchy/watery/red eyes  - have access to albuterol inhaler 2 puffs every 4-6 hours as needed for cough/wheeze/shortness of breath/chest tightness.  May use 15-20 minutes prior to activity.   Monitor frequency of use.    - start Qvar 82mcg 2 puffs twice a day at this time for cough/wheeze.  This is a inhaled steroid and should not raise your blood sugar levels.  - continue Montelukast 10mg  daily  - may use Mucinex 600-1200mg  daily as needed to help thin/loosen mucus  - continue prilosec daily  Follow-up 3 months or sooner if needed

## 2017-07-17 NOTE — Progress Notes (Signed)
Follow-up Note  RE: Lauren Webb MRN: [id number] DOB: 1945-06-02 Date of Office Visit: 07/17/2017   History of present illness: Lauren Webb is a 72 y.o. female presenting today for sick visit.  She was last seen in the office on 02/19/17 by myself.  She has history of allergic rhinoconjunctivitis, wheezing and GERD.  She states after last visit she was doing well up until end of last week.  She states she mowed the grass and then inside the home she saw ants and used HOTSHOT insect spray and believes the combination of grass exposure and the insect spray set off her current symptoms.  She reports having cough that tends to get worse with talking, increase nasal drainage and throat clearing, some wheezing.  No fevers and denies chest tightness but does feel SOB.  She states the albuterol provides some relief however she is almost out of her inhaler and states it cost her about $60 which is costly for her.  She does states that the Dymista was very helpful but it was very expensive and was not able to afford any refills.  She is taking xyzal daily as well as singulair and prilosec.   She did call our office at the end of last week with this symptoms was prednisone was called in however due to her history of diabetes states the prednisone raised her BG too much (~340s) and thus she did not take any further doses.  She was also prescribed tessalon perles which help temporarily.    Review of systems: Review of Systems  Constitutional: Negative for chills, fever and malaise/fatigue.  HENT: Positive for congestion and sore throat. Negative for ear discharge, ear pain, nosebleeds and sinus pain.   Eyes: Negative for pain, discharge and redness.  Respiratory: Positive for cough, shortness of breath and wheezing. Negative for sputum production.   Cardiovascular: Negative for chest pain.  Gastrointestinal: Negative for abdominal pain, constipation, diarrhea, nausea and vomiting.  Musculoskeletal:  Negative for joint pain.  Skin: Negative for itching and rash.  Neurological: Negative for headaches.    All other systems negative unless noted above in HPI  Past medical/social/surgical/family history have been reviewed and are unchanged unless specifically indicated below.  No changes  Medication List: Allergies as of 07/17/2017      Reactions   Avelox [moxifloxacin Hcl In Nacl] Other (See Comments)   weakness      Medication List        Accurate as of 07/17/17  4:53 PM. Always use your most recent med list.          albuterol 108 (90 Base) MCG/ACT inhaler Commonly known as:  PROAIR HFA Inhale 2 puffs into the lungs every 4 (four) hours as needed for wheezing or shortness of breath.   allopurinol 100 MG tablet Commonly known as:  ZYLOPRIM Take 200 mg by mouth daily.   aspirin EC 81 MG tablet Take 81 mg by mouth daily.   beclomethasone 80 MCG/ACT inhaler Commonly known as:  QVAR REDIHALER Inhale two puffs twice daily to prevent cough or wheeze.  Rinse, gargle, and spit after use. SAMPLE   benzonatate 100 MG capsule Commonly known as:  TESSALON PERLES Take 1 capsule (100 mg total) by mouth 3 (three) times daily. Take up to three times a day as needed for cough   furosemide 40 MG tablet Commonly known as:  LASIX Take 40 mg by mouth daily.   gemfibrozil 600 MG tablet Commonly known as:  LOPID Take 1,200 mg by mouth 2 (two) times daily before a meal.   glipiZIDE 10 MG tablet Commonly known as:  GLUCOTROL Take 20 mg by mouth 2 (two) times daily before a meal.   liraglutide 18 MG/3ML Sopn Commonly known as:  VICTOZA Inject into the skin as needed.   lisinopril 20 MG tablet Commonly known as:  PRINIVIL,ZESTRIL Take 20 mg by mouth daily.   montelukast 10 MG tablet Commonly known as:  SINGULAIR Take 1 tablet (10 mg total) by mouth at bedtime.   OMEGA 3 PO Take 2 capsules by mouth 2 (two) times daily.   omeprazole 40 MG capsule Commonly known as:   PRILOSEC Take 40 mg by mouth daily.   ONE TOUCH ULTRA TEST test strip Generic drug:  glucose blood USE AS DIRECTED 4 TIMES A DAY   pravastatin 40 MG tablet Commonly known as:  PRAVACHOL Take 40 mg by mouth daily.   PRESCRIPTION MEDICATION Insulin pen- 15 units daily   VITAMIN B-12 PO Take 1 tablet by mouth daily.   VITAMIN D PO Take by mouth daily.       Known medication allergies: Allergies  Allergen Reactions  . Avelox [Moxifloxacin Hcl In Nacl] Other (See Comments)    weakness     Physical examination: Blood pressure (!) 142/72, pulse 72, temperature 98.2 F (36.8 C), temperature source Oral, resp. rate 20.  General: Alert, interactive, in no acute distress. HEENT: PERRLA, TMs pearly gray, turbinates moderately edematous with clear discharge, post-pharynx non erythematous, +cobblestoning of posterior oropharynx. Neck: Supple without lymphadenopathy. Lungs: Clear to auscultation without wheezing, rhonchi or rales. {no increased work of breathing. CV: Normal S1, S2 without murmurs. Abdomen: Nondistended, nontender. Skin: Warm and dry, without lesions or rashes. Extremities:  No clubbing, cyanosis or edema. Neuro:   Grossly intact.  Diagnositics/Labs: Labs:  Component     Latest Ref Rng & Units 02/19/2017  IgE (Immunoglobulin E), Serum     0 - 100 IU/mL 32  D Pteronyssinus IgE     Class 0 kU/L <0.10  D Farinae IgE     Class 0 kU/L <0.10  Cat Dander IgE     Class 0 kU/L <0.10  Dog Dander IgE     Class 0 kU/L <0.10  [place] Grass IgE     Class 0 kU/L <0.10  Timothy Grass IgE     Class 0 kU/L <0.10  [place] Grass IgE     Class 0 kU/L <0.10  Cockroach, German IgE     Class 0 kU/L <0.10  Penicillium Chrysogen IgE     Class 0 kU/L <0.10  Cladosporium Herbarum IgE     Class 0 kU/L <0.10  Aspergillus Fumigatus IgE     Class 0 kU/L <0.10  Alternaria Alternata IgE     Class 0 kU/L <0.10  Maple/Box Elder IgE     Class 0 kU/L <0.10  Common Silver Wendee Copp  IgE     Class 0 kU/L <0.10  Cedar, [place] IgE     Class 0 kU/L <0.10  Oak, White IgE     Class 0 kU/L <0.10  Elm, American IgE     Class 0 kU/L <0.10  Cottonwood IgE     Class 0 kU/L <0.10  Pecan, Hickory IgE     Class 0 kU/L <0.10  White Mulberry IgE     Class 0 kU/L <0.10  Ragweed, Short IgE     Class 0 kU/L <0.10  Pigweed, Rough IgE  Class 0 kU/L <0.10  Sheep Sorrel IgE Qn     Class 0 kU/L <0.10  Mouse Urine IgE     Class 0 kU/L <0.10    Spirometry: FEV1: 1.87L  87%, FVC: 2.1L  73%.  FVC is slightly reduced for age  Assessment and plan:   Non-allergic rhinoconjunctivitis - it appears current symptoms with increased PND with cough is likely triggered by recurrent allergen/irritant exposure with grass particles and strong fume from insect spray -environmental allergy testing has been negative  - you have had improvement in nasal symptoms (post-nasal drainage) with use of dymista.  Use dymista 1 spray each nostril twice a day.  This is a combination nasal spray with Flonase + Astelin (nasal antihistamine).  This helps with both nasal congestion and drainage.   We have provided samples today.    We will prescribe Astelin separately as a prescription to use once you run out of Dymista.    - continue Xyzal 5mg  daily.  This is a long-acting antihistamine.   - Pazeo 1 drop each eye as needed daily for itchy/watery/red eyes - continue Montelukast 10mg  daily - may use Mucinex 600-1200mg  daily as needed to help thin/loosen mucus  Wheezing/Mild intermittent asthma - current symptoms likely triggered by above irritants - have access to albuterol inhaler 2 puffs every 4-6 hours as needed for cough/wheeze/shortness of breath/chest tightness.  May use 15-20 minutes prior to activity.   Monitor frequency of use.   -Singulair as above - start Qvar 36mcg 2 puffs twice a day at this time for cough/wheeze.  This is a inhaled steroid and should not raise your blood sugar  levels.  GERD - continue prilosec daily -Lifestyle change including reducing/eliminating foods that trigger symptoms as well as increase head of bed  Follow-up 3 months or sooner if needed  I appreciate the opportunity to take part in [facility] care. Please do not hesitate to contact me with questions.  Sincerely,   Prudy Feeler, MD Allergy/Immunology [facility] [place] [facility] [place]

## 2017-07-19 DIAGNOSIS — D2239 Melanocytic nevi of other parts of face: Secondary | ICD-10-CM | POA: Diagnosis not present

## 2017-07-19 DIAGNOSIS — C44311 Basal cell carcinoma of skin of nose: Secondary | ICD-10-CM | POA: Diagnosis not present

## 2017-07-24 DIAGNOSIS — R69 Illness, unspecified: Secondary | ICD-10-CM | POA: Diagnosis not present

## 2017-07-31 DIAGNOSIS — N184 Chronic kidney disease, stage 4 (severe): Secondary | ICD-10-CM | POA: Diagnosis not present

## 2017-08-06 DIAGNOSIS — C44321 Squamous cell carcinoma of skin of nose: Secondary | ICD-10-CM | POA: Diagnosis not present

## 2017-08-23 DIAGNOSIS — R7989 Other specified abnormal findings of blood chemistry: Secondary | ICD-10-CM | POA: Diagnosis not present

## 2017-08-26 DIAGNOSIS — R69 Illness, unspecified: Secondary | ICD-10-CM | POA: Diagnosis not present

## 2017-09-03 DIAGNOSIS — M5416 Radiculopathy, lumbar region: Secondary | ICD-10-CM | POA: Diagnosis not present

## 2017-09-03 DIAGNOSIS — M47816 Spondylosis without myelopathy or radiculopathy, lumbar region: Secondary | ICD-10-CM | POA: Diagnosis not present

## 2017-09-03 DIAGNOSIS — M4317 Spondylolisthesis, lumbosacral region: Secondary | ICD-10-CM | POA: Diagnosis not present

## 2017-09-03 DIAGNOSIS — I10 Essential (primary) hypertension: Secondary | ICD-10-CM | POA: Diagnosis not present

## 2017-09-03 DIAGNOSIS — Z6841 Body Mass Index (BMI) 40.0 and over, adult: Secondary | ICD-10-CM | POA: Diagnosis not present

## 2017-09-03 DIAGNOSIS — M5136 Other intervertebral disc degeneration, lumbar region: Secondary | ICD-10-CM | POA: Diagnosis not present

## 2017-09-10 DIAGNOSIS — M1711 Unilateral primary osteoarthritis, right knee: Secondary | ICD-10-CM | POA: Diagnosis not present

## 2017-09-25 DIAGNOSIS — R69 Illness, unspecified: Secondary | ICD-10-CM | POA: Diagnosis not present

## 2017-09-27 DIAGNOSIS — H52223 Regular astigmatism, bilateral: Secondary | ICD-10-CM | POA: Diagnosis not present

## 2017-10-05 DIAGNOSIS — E1165 Type 2 diabetes mellitus with hyperglycemia: Secondary | ICD-10-CM | POA: Diagnosis not present

## 2017-10-05 DIAGNOSIS — N390 Urinary tract infection, site not specified: Secondary | ICD-10-CM | POA: Diagnosis not present

## 2017-10-05 DIAGNOSIS — Z79899 Other long term (current) drug therapy: Secondary | ICD-10-CM | POA: Diagnosis not present

## 2017-10-05 DIAGNOSIS — R5383 Other fatigue: Secondary | ICD-10-CM | POA: Diagnosis not present

## 2017-10-05 DIAGNOSIS — Z6841 Body Mass Index (BMI) 40.0 and over, adult: Secondary | ICD-10-CM | POA: Diagnosis not present

## 2017-10-05 DIAGNOSIS — R1084 Generalized abdominal pain: Secondary | ICD-10-CM | POA: Diagnosis not present

## 2017-10-05 DIAGNOSIS — I10 Essential (primary) hypertension: Secondary | ICD-10-CM | POA: Diagnosis not present

## 2017-10-05 DIAGNOSIS — M549 Dorsalgia, unspecified: Secondary | ICD-10-CM | POA: Diagnosis not present

## 2017-10-05 DIAGNOSIS — E785 Hyperlipidemia, unspecified: Secondary | ICD-10-CM | POA: Diagnosis not present

## 2017-10-09 DIAGNOSIS — Z9049 Acquired absence of other specified parts of digestive tract: Secondary | ICD-10-CM | POA: Diagnosis not present

## 2017-10-09 DIAGNOSIS — R109 Unspecified abdominal pain: Secondary | ICD-10-CM | POA: Diagnosis not present

## 2017-10-09 DIAGNOSIS — K5730 Diverticulosis of large intestine without perforation or abscess without bleeding: Secondary | ICD-10-CM | POA: Diagnosis not present

## 2017-10-09 DIAGNOSIS — N200 Calculus of kidney: Secondary | ICD-10-CM | POA: Diagnosis not present

## 2017-10-09 DIAGNOSIS — K429 Umbilical hernia without obstruction or gangrene: Secondary | ICD-10-CM | POA: Diagnosis not present

## 2017-10-11 DIAGNOSIS — R1084 Generalized abdominal pain: Secondary | ICD-10-CM | POA: Diagnosis not present

## 2017-10-11 DIAGNOSIS — T2122XA Burn of second degree of abdominal wall, initial encounter: Secondary | ICD-10-CM | POA: Diagnosis not present

## 2017-10-13 DIAGNOSIS — E1165 Type 2 diabetes mellitus with hyperglycemia: Secondary | ICD-10-CM | POA: Diagnosis not present

## 2017-10-13 DIAGNOSIS — T310 Burns involving less than 10% of body surface: Secondary | ICD-10-CM | POA: Diagnosis not present

## 2017-10-13 DIAGNOSIS — E119 Type 2 diabetes mellitus without complications: Secondary | ICD-10-CM | POA: Diagnosis not present

## 2017-10-13 DIAGNOSIS — T2122XA Burn of second degree of abdominal wall, initial encounter: Secondary | ICD-10-CM | POA: Diagnosis not present

## 2017-10-13 DIAGNOSIS — T798XXA Other early complications of trauma, initial encounter: Secondary | ICD-10-CM | POA: Diagnosis not present

## 2017-10-13 DIAGNOSIS — I10 Essential (primary) hypertension: Secondary | ICD-10-CM | POA: Insufficient documentation

## 2017-10-13 DIAGNOSIS — I517 Cardiomegaly: Secondary | ICD-10-CM | POA: Diagnosis not present

## 2017-10-13 DIAGNOSIS — E785 Hyperlipidemia, unspecified: Secondary | ICD-10-CM | POA: Insufficient documentation

## 2017-10-13 DIAGNOSIS — T2132XA Burn of third degree of abdominal wall, initial encounter: Secondary | ICD-10-CM | POA: Diagnosis not present

## 2017-10-13 DIAGNOSIS — L03311 Cellulitis of abdominal wall: Secondary | ICD-10-CM | POA: Diagnosis not present

## 2017-10-13 DIAGNOSIS — E1122 Type 2 diabetes mellitus with diabetic chronic kidney disease: Secondary | ICD-10-CM | POA: Diagnosis not present

## 2017-10-13 DIAGNOSIS — N179 Acute kidney failure, unspecified: Secondary | ICD-10-CM | POA: Diagnosis not present

## 2017-10-13 DIAGNOSIS — Z6841 Body Mass Index (BMI) 40.0 and over, adult: Secondary | ICD-10-CM | POA: Diagnosis not present

## 2017-10-13 DIAGNOSIS — Z794 Long term (current) use of insulin: Secondary | ICD-10-CM | POA: Diagnosis not present

## 2017-10-13 DIAGNOSIS — E118 Type 2 diabetes mellitus with unspecified complications: Secondary | ICD-10-CM | POA: Diagnosis not present

## 2017-10-13 DIAGNOSIS — R69 Illness, unspecified: Secondary | ICD-10-CM | POA: Diagnosis not present

## 2017-10-13 DIAGNOSIS — N184 Chronic kidney disease, stage 4 (severe): Secondary | ICD-10-CM | POA: Diagnosis not present

## 2017-10-13 DIAGNOSIS — X12XXXA Contact with other hot fluids, initial encounter: Secondary | ICD-10-CM | POA: Diagnosis not present

## 2017-10-13 DIAGNOSIS — I129 Hypertensive chronic kidney disease with stage 1 through stage 4 chronic kidney disease, or unspecified chronic kidney disease: Secondary | ICD-10-CM | POA: Diagnosis not present

## 2017-10-13 DIAGNOSIS — N189 Chronic kidney disease, unspecified: Secondary | ICD-10-CM | POA: Diagnosis not present

## 2017-10-13 DIAGNOSIS — E1129 Type 2 diabetes mellitus with other diabetic kidney complication: Secondary | ICD-10-CM | POA: Diagnosis not present

## 2017-10-14 DIAGNOSIS — N189 Chronic kidney disease, unspecified: Secondary | ICD-10-CM | POA: Diagnosis not present

## 2017-10-14 DIAGNOSIS — T310 Burns involving less than 10% of body surface: Secondary | ICD-10-CM | POA: Diagnosis not present

## 2017-10-14 DIAGNOSIS — E1129 Type 2 diabetes mellitus with other diabetic kidney complication: Secondary | ICD-10-CM | POA: Diagnosis not present

## 2017-10-14 DIAGNOSIS — Z794 Long term (current) use of insulin: Secondary | ICD-10-CM | POA: Diagnosis not present

## 2017-10-14 DIAGNOSIS — I129 Hypertensive chronic kidney disease with stage 1 through stage 4 chronic kidney disease, or unspecified chronic kidney disease: Secondary | ICD-10-CM | POA: Diagnosis not present

## 2017-10-14 DIAGNOSIS — E118 Type 2 diabetes mellitus with unspecified complications: Secondary | ICD-10-CM | POA: Diagnosis not present

## 2017-10-14 DIAGNOSIS — E1122 Type 2 diabetes mellitus with diabetic chronic kidney disease: Secondary | ICD-10-CM | POA: Diagnosis not present

## 2017-10-14 DIAGNOSIS — T2132XA Burn of third degree of abdominal wall, initial encounter: Secondary | ICD-10-CM | POA: Diagnosis not present

## 2017-10-15 DIAGNOSIS — T2132XA Burn of third degree of abdominal wall, initial encounter: Secondary | ICD-10-CM | POA: Diagnosis not present

## 2017-10-15 DIAGNOSIS — E1122 Type 2 diabetes mellitus with diabetic chronic kidney disease: Secondary | ICD-10-CM | POA: Diagnosis not present

## 2017-10-15 DIAGNOSIS — I129 Hypertensive chronic kidney disease with stage 1 through stage 4 chronic kidney disease, or unspecified chronic kidney disease: Secondary | ICD-10-CM | POA: Diagnosis not present

## 2017-10-15 DIAGNOSIS — T310 Burns involving less than 10% of body surface: Secondary | ICD-10-CM | POA: Diagnosis not present

## 2017-10-15 DIAGNOSIS — N189 Chronic kidney disease, unspecified: Secondary | ICD-10-CM | POA: Diagnosis not present

## 2017-10-15 DIAGNOSIS — Z794 Long term (current) use of insulin: Secondary | ICD-10-CM | POA: Diagnosis not present

## 2017-10-15 DIAGNOSIS — T798XXA Other early complications of trauma, initial encounter: Secondary | ICD-10-CM | POA: Diagnosis not present

## 2017-10-15 DIAGNOSIS — E119 Type 2 diabetes mellitus without complications: Secondary | ICD-10-CM | POA: Diagnosis not present

## 2017-10-15 DIAGNOSIS — E1129 Type 2 diabetes mellitus with other diabetic kidney complication: Secondary | ICD-10-CM | POA: Diagnosis not present

## 2017-10-16 DIAGNOSIS — E1122 Type 2 diabetes mellitus with diabetic chronic kidney disease: Secondary | ICD-10-CM | POA: Diagnosis not present

## 2017-10-16 DIAGNOSIS — E1129 Type 2 diabetes mellitus with other diabetic kidney complication: Secondary | ICD-10-CM | POA: Diagnosis not present

## 2017-10-16 DIAGNOSIS — T310 Burns involving less than 10% of body surface: Secondary | ICD-10-CM | POA: Diagnosis not present

## 2017-10-16 DIAGNOSIS — Z794 Long term (current) use of insulin: Secondary | ICD-10-CM | POA: Diagnosis not present

## 2017-10-16 DIAGNOSIS — I129 Hypertensive chronic kidney disease with stage 1 through stage 4 chronic kidney disease, or unspecified chronic kidney disease: Secondary | ICD-10-CM | POA: Diagnosis not present

## 2017-10-16 DIAGNOSIS — N189 Chronic kidney disease, unspecified: Secondary | ICD-10-CM | POA: Diagnosis not present

## 2017-10-16 DIAGNOSIS — T2132XA Burn of third degree of abdominal wall, initial encounter: Secondary | ICD-10-CM | POA: Diagnosis not present

## 2017-10-16 DIAGNOSIS — E118 Type 2 diabetes mellitus with unspecified complications: Secondary | ICD-10-CM | POA: Diagnosis not present

## 2017-10-16 DIAGNOSIS — T798XXA Other early complications of trauma, initial encounter: Secondary | ICD-10-CM | POA: Diagnosis not present

## 2017-10-17 DIAGNOSIS — E1122 Type 2 diabetes mellitus with diabetic chronic kidney disease: Secondary | ICD-10-CM | POA: Diagnosis not present

## 2017-10-17 DIAGNOSIS — E118 Type 2 diabetes mellitus with unspecified complications: Secondary | ICD-10-CM | POA: Diagnosis not present

## 2017-10-17 DIAGNOSIS — Z794 Long term (current) use of insulin: Secondary | ICD-10-CM | POA: Diagnosis not present

## 2017-10-17 DIAGNOSIS — T2132XA Burn of third degree of abdominal wall, initial encounter: Secondary | ICD-10-CM | POA: Diagnosis not present

## 2017-10-17 DIAGNOSIS — T310 Burns involving less than 10% of body surface: Secondary | ICD-10-CM | POA: Diagnosis not present

## 2017-10-17 DIAGNOSIS — N189 Chronic kidney disease, unspecified: Secondary | ICD-10-CM | POA: Diagnosis not present

## 2017-10-18 DIAGNOSIS — T310 Burns involving less than 10% of body surface: Secondary | ICD-10-CM | POA: Diagnosis not present

## 2017-10-18 DIAGNOSIS — E1122 Type 2 diabetes mellitus with diabetic chronic kidney disease: Secondary | ICD-10-CM | POA: Diagnosis not present

## 2017-10-18 DIAGNOSIS — E118 Type 2 diabetes mellitus with unspecified complications: Secondary | ICD-10-CM | POA: Diagnosis not present

## 2017-10-18 DIAGNOSIS — T798XXA Other early complications of trauma, initial encounter: Secondary | ICD-10-CM | POA: Diagnosis not present

## 2017-10-18 DIAGNOSIS — N189 Chronic kidney disease, unspecified: Secondary | ICD-10-CM | POA: Diagnosis not present

## 2017-10-18 DIAGNOSIS — T2132XA Burn of third degree of abdominal wall, initial encounter: Secondary | ICD-10-CM | POA: Diagnosis not present

## 2017-10-19 DIAGNOSIS — T310 Burns involving less than 10% of body surface: Secondary | ICD-10-CM | POA: Diagnosis not present

## 2017-10-19 DIAGNOSIS — E118 Type 2 diabetes mellitus with unspecified complications: Secondary | ICD-10-CM | POA: Diagnosis not present

## 2017-10-19 DIAGNOSIS — T2132XA Burn of third degree of abdominal wall, initial encounter: Secondary | ICD-10-CM | POA: Diagnosis not present

## 2017-10-19 DIAGNOSIS — N189 Chronic kidney disease, unspecified: Secondary | ICD-10-CM | POA: Diagnosis not present

## 2017-10-19 DIAGNOSIS — E1122 Type 2 diabetes mellitus with diabetic chronic kidney disease: Secondary | ICD-10-CM | POA: Diagnosis not present

## 2017-10-19 DIAGNOSIS — T798XXA Other early complications of trauma, initial encounter: Secondary | ICD-10-CM | POA: Diagnosis not present

## 2017-10-20 DIAGNOSIS — T2132XA Burn of third degree of abdominal wall, initial encounter: Secondary | ICD-10-CM | POA: Diagnosis not present

## 2017-10-20 DIAGNOSIS — T310 Burns involving less than 10% of body surface: Secondary | ICD-10-CM | POA: Diagnosis not present

## 2017-10-20 DIAGNOSIS — T798XXA Other early complications of trauma, initial encounter: Secondary | ICD-10-CM | POA: Diagnosis not present

## 2017-10-21 DIAGNOSIS — E118 Type 2 diabetes mellitus with unspecified complications: Secondary | ICD-10-CM | POA: Diagnosis not present

## 2017-10-21 DIAGNOSIS — I129 Hypertensive chronic kidney disease with stage 1 through stage 4 chronic kidney disease, or unspecified chronic kidney disease: Secondary | ICD-10-CM | POA: Diagnosis not present

## 2017-10-21 DIAGNOSIS — T798XXA Other early complications of trauma, initial encounter: Secondary | ICD-10-CM | POA: Diagnosis not present

## 2017-10-21 DIAGNOSIS — Z794 Long term (current) use of insulin: Secondary | ICD-10-CM | POA: Diagnosis not present

## 2017-10-21 DIAGNOSIS — N189 Chronic kidney disease, unspecified: Secondary | ICD-10-CM | POA: Diagnosis not present

## 2017-10-21 DIAGNOSIS — T310 Burns involving less than 10% of body surface: Secondary | ICD-10-CM | POA: Diagnosis not present

## 2017-10-21 DIAGNOSIS — T2132XA Burn of third degree of abdominal wall, initial encounter: Secondary | ICD-10-CM | POA: Diagnosis not present

## 2017-10-22 DIAGNOSIS — R69 Illness, unspecified: Secondary | ICD-10-CM | POA: Diagnosis not present

## 2017-10-22 DIAGNOSIS — I129 Hypertensive chronic kidney disease with stage 1 through stage 4 chronic kidney disease, or unspecified chronic kidney disease: Secondary | ICD-10-CM | POA: Diagnosis not present

## 2017-10-22 DIAGNOSIS — Z794 Long term (current) use of insulin: Secondary | ICD-10-CM | POA: Diagnosis not present

## 2017-10-22 DIAGNOSIS — E1129 Type 2 diabetes mellitus with other diabetic kidney complication: Secondary | ICD-10-CM | POA: Diagnosis not present

## 2017-10-22 DIAGNOSIS — T2132XA Burn of third degree of abdominal wall, initial encounter: Secondary | ICD-10-CM | POA: Diagnosis not present

## 2017-10-22 DIAGNOSIS — E118 Type 2 diabetes mellitus with unspecified complications: Secondary | ICD-10-CM | POA: Diagnosis not present

## 2017-10-22 DIAGNOSIS — N189 Chronic kidney disease, unspecified: Secondary | ICD-10-CM | POA: Diagnosis not present

## 2017-10-23 DIAGNOSIS — Z794 Long term (current) use of insulin: Secondary | ICD-10-CM | POA: Diagnosis not present

## 2017-10-23 DIAGNOSIS — I129 Hypertensive chronic kidney disease with stage 1 through stage 4 chronic kidney disease, or unspecified chronic kidney disease: Secondary | ICD-10-CM | POA: Diagnosis not present

## 2017-10-23 DIAGNOSIS — T310 Burns involving less than 10% of body surface: Secondary | ICD-10-CM | POA: Diagnosis not present

## 2017-10-23 DIAGNOSIS — E118 Type 2 diabetes mellitus with unspecified complications: Secondary | ICD-10-CM | POA: Diagnosis not present

## 2017-10-23 DIAGNOSIS — T2132XA Burn of third degree of abdominal wall, initial encounter: Secondary | ICD-10-CM | POA: Diagnosis not present

## 2017-10-23 DIAGNOSIS — N189 Chronic kidney disease, unspecified: Secondary | ICD-10-CM | POA: Diagnosis not present

## 2017-10-24 DIAGNOSIS — R69 Illness, unspecified: Secondary | ICD-10-CM | POA: Diagnosis not present

## 2017-11-05 DIAGNOSIS — E119 Type 2 diabetes mellitus without complications: Secondary | ICD-10-CM | POA: Diagnosis not present

## 2017-11-05 DIAGNOSIS — Z79899 Other long term (current) drug therapy: Secondary | ICD-10-CM | POA: Diagnosis not present

## 2017-11-05 DIAGNOSIS — Z87442 Personal history of urinary calculi: Secondary | ICD-10-CM | POA: Diagnosis not present

## 2017-11-05 DIAGNOSIS — T310 Burns involving less than 10% of body surface: Secondary | ICD-10-CM | POA: Diagnosis not present

## 2017-11-05 DIAGNOSIS — Z7984 Long term (current) use of oral hypoglycemic drugs: Secondary | ICD-10-CM | POA: Diagnosis not present

## 2017-11-05 DIAGNOSIS — T2132XA Burn of third degree of abdominal wall, initial encounter: Secondary | ICD-10-CM | POA: Diagnosis not present

## 2017-11-05 DIAGNOSIS — T2132XD Burn of third degree of abdominal wall, subsequent encounter: Secondary | ICD-10-CM | POA: Diagnosis not present

## 2017-11-05 DIAGNOSIS — I10 Essential (primary) hypertension: Secondary | ICD-10-CM | POA: Diagnosis not present

## 2017-11-05 DIAGNOSIS — X12XXXD Contact with other hot fluids, subsequent encounter: Secondary | ICD-10-CM | POA: Diagnosis not present

## 2017-11-05 DIAGNOSIS — R69 Illness, unspecified: Secondary | ICD-10-CM | POA: Diagnosis not present

## 2017-11-05 DIAGNOSIS — E785 Hyperlipidemia, unspecified: Secondary | ICD-10-CM | POA: Diagnosis not present

## 2017-11-09 DIAGNOSIS — G4733 Obstructive sleep apnea (adult) (pediatric): Secondary | ICD-10-CM | POA: Diagnosis not present

## 2017-11-12 DIAGNOSIS — T310 Burns involving less than 10% of body surface: Secondary | ICD-10-CM | POA: Diagnosis not present

## 2017-11-12 DIAGNOSIS — X118XXA Contact with other hot tap-water, initial encounter: Secondary | ICD-10-CM | POA: Diagnosis not present

## 2017-11-12 DIAGNOSIS — E119 Type 2 diabetes mellitus without complications: Secondary | ICD-10-CM | POA: Diagnosis not present

## 2017-11-12 DIAGNOSIS — I10 Essential (primary) hypertension: Secondary | ICD-10-CM | POA: Diagnosis not present

## 2017-11-12 DIAGNOSIS — R69 Illness, unspecified: Secondary | ICD-10-CM | POA: Diagnosis not present

## 2017-11-12 DIAGNOSIS — E538 Deficiency of other specified B group vitamins: Secondary | ICD-10-CM | POA: Diagnosis not present

## 2017-11-12 DIAGNOSIS — Z794 Long term (current) use of insulin: Secondary | ICD-10-CM | POA: Diagnosis not present

## 2017-11-12 DIAGNOSIS — T2122XA Burn of second degree of abdominal wall, initial encounter: Secondary | ICD-10-CM | POA: Diagnosis not present

## 2017-11-12 DIAGNOSIS — G4730 Sleep apnea, unspecified: Secondary | ICD-10-CM | POA: Diagnosis not present

## 2017-11-12 DIAGNOSIS — M1711 Unilateral primary osteoarthritis, right knee: Secondary | ICD-10-CM | POA: Diagnosis not present

## 2017-11-16 DIAGNOSIS — R69 Illness, unspecified: Secondary | ICD-10-CM | POA: Diagnosis not present

## 2017-11-19 DIAGNOSIS — E119 Type 2 diabetes mellitus without complications: Secondary | ICD-10-CM | POA: Diagnosis not present

## 2017-11-19 DIAGNOSIS — T2122XA Burn of second degree of abdominal wall, initial encounter: Secondary | ICD-10-CM | POA: Diagnosis not present

## 2017-11-19 DIAGNOSIS — I10 Essential (primary) hypertension: Secondary | ICD-10-CM | POA: Diagnosis not present

## 2017-11-19 DIAGNOSIS — X118XXA Contact with other hot tap-water, initial encounter: Secondary | ICD-10-CM | POA: Diagnosis not present

## 2017-11-19 DIAGNOSIS — T310 Burns involving less than 10% of body surface: Secondary | ICD-10-CM | POA: Diagnosis not present

## 2017-11-21 DIAGNOSIS — E785 Hyperlipidemia, unspecified: Secondary | ICD-10-CM | POA: Diagnosis not present

## 2017-11-21 DIAGNOSIS — E1165 Type 2 diabetes mellitus with hyperglycemia: Secondary | ICD-10-CM | POA: Diagnosis not present

## 2017-11-21 DIAGNOSIS — K219 Gastro-esophageal reflux disease without esophagitis: Secondary | ICD-10-CM | POA: Diagnosis not present

## 2017-11-21 DIAGNOSIS — I10 Essential (primary) hypertension: Secondary | ICD-10-CM | POA: Diagnosis not present

## 2017-11-21 DIAGNOSIS — N183 Chronic kidney disease, stage 3 (moderate): Secondary | ICD-10-CM | POA: Diagnosis not present

## 2017-11-21 DIAGNOSIS — Z6841 Body Mass Index (BMI) 40.0 and over, adult: Secondary | ICD-10-CM | POA: Diagnosis not present

## 2017-11-21 DIAGNOSIS — N390 Urinary tract infection, site not specified: Secondary | ICD-10-CM | POA: Diagnosis not present

## 2017-11-26 DIAGNOSIS — T2122XA Burn of second degree of abdominal wall, initial encounter: Secondary | ICD-10-CM | POA: Diagnosis not present

## 2017-11-26 DIAGNOSIS — I10 Essential (primary) hypertension: Secondary | ICD-10-CM | POA: Diagnosis not present

## 2017-11-26 DIAGNOSIS — X118XXA Contact with other hot tap-water, initial encounter: Secondary | ICD-10-CM | POA: Diagnosis not present

## 2017-11-26 DIAGNOSIS — E119 Type 2 diabetes mellitus without complications: Secondary | ICD-10-CM | POA: Diagnosis not present

## 2017-11-26 DIAGNOSIS — T310 Burns involving less than 10% of body surface: Secondary | ICD-10-CM | POA: Diagnosis not present

## 2017-12-05 DIAGNOSIS — T2122XA Burn of second degree of abdominal wall, initial encounter: Secondary | ICD-10-CM | POA: Diagnosis not present

## 2017-12-05 DIAGNOSIS — E119 Type 2 diabetes mellitus without complications: Secondary | ICD-10-CM | POA: Diagnosis not present

## 2017-12-05 DIAGNOSIS — X118XXA Contact with other hot tap-water, initial encounter: Secondary | ICD-10-CM | POA: Diagnosis not present

## 2017-12-05 DIAGNOSIS — I10 Essential (primary) hypertension: Secondary | ICD-10-CM | POA: Diagnosis not present

## 2017-12-05 DIAGNOSIS — T310 Burns involving less than 10% of body surface: Secondary | ICD-10-CM | POA: Diagnosis not present

## 2017-12-11 DIAGNOSIS — T2122XA Burn of second degree of abdominal wall, initial encounter: Secondary | ICD-10-CM | POA: Diagnosis not present

## 2017-12-11 DIAGNOSIS — Z87828 Personal history of other (healed) physical injury and trauma: Secondary | ICD-10-CM | POA: Diagnosis not present

## 2017-12-11 DIAGNOSIS — Z09 Encounter for follow-up examination after completed treatment for conditions other than malignant neoplasm: Secondary | ICD-10-CM | POA: Diagnosis not present

## 2017-12-12 DIAGNOSIS — R69 Illness, unspecified: Secondary | ICD-10-CM | POA: Diagnosis not present

## 2017-12-19 DIAGNOSIS — M1711 Unilateral primary osteoarthritis, right knee: Secondary | ICD-10-CM | POA: Diagnosis not present

## 2017-12-26 DIAGNOSIS — M1712 Unilateral primary osteoarthritis, left knee: Secondary | ICD-10-CM | POA: Diagnosis not present

## 2018-01-12 ENCOUNTER — Other Ambulatory Visit: Payer: Self-pay | Admitting: Family Medicine

## 2018-01-25 DIAGNOSIS — R69 Illness, unspecified: Secondary | ICD-10-CM | POA: Diagnosis not present

## 2018-02-19 DIAGNOSIS — M7062 Trochanteric bursitis, left hip: Secondary | ICD-10-CM | POA: Diagnosis not present

## 2018-02-21 DIAGNOSIS — J019 Acute sinusitis, unspecified: Secondary | ICD-10-CM | POA: Diagnosis not present

## 2018-02-21 DIAGNOSIS — E785 Hyperlipidemia, unspecified: Secondary | ICD-10-CM | POA: Diagnosis not present

## 2018-02-21 DIAGNOSIS — E1165 Type 2 diabetes mellitus with hyperglycemia: Secondary | ICD-10-CM | POA: Diagnosis not present

## 2018-02-21 DIAGNOSIS — Z79899 Other long term (current) drug therapy: Secondary | ICD-10-CM | POA: Diagnosis not present

## 2018-02-21 DIAGNOSIS — D509 Iron deficiency anemia, unspecified: Secondary | ICD-10-CM | POA: Diagnosis not present

## 2018-02-21 DIAGNOSIS — I10 Essential (primary) hypertension: Secondary | ICD-10-CM | POA: Diagnosis not present

## 2018-03-05 DIAGNOSIS — N183 Chronic kidney disease, stage 3 (moderate): Secondary | ICD-10-CM | POA: Diagnosis not present

## 2018-03-11 DIAGNOSIS — K429 Umbilical hernia without obstruction or gangrene: Secondary | ICD-10-CM | POA: Diagnosis not present

## 2018-03-11 DIAGNOSIS — R1031 Right lower quadrant pain: Secondary | ICD-10-CM | POA: Diagnosis not present

## 2018-03-15 ENCOUNTER — Encounter: Payer: Self-pay | Admitting: Physician Assistant

## 2018-03-19 DIAGNOSIS — Z79899 Other long term (current) drug therapy: Secondary | ICD-10-CM | POA: Diagnosis not present

## 2018-03-21 ENCOUNTER — Ambulatory Visit: Payer: Self-pay | Admitting: Physician Assistant

## 2018-03-28 DIAGNOSIS — E1165 Type 2 diabetes mellitus with hyperglycemia: Secondary | ICD-10-CM | POA: Diagnosis not present

## 2018-03-28 DIAGNOSIS — I10 Essential (primary) hypertension: Secondary | ICD-10-CM | POA: Diagnosis not present

## 2018-03-28 DIAGNOSIS — Z79899 Other long term (current) drug therapy: Secondary | ICD-10-CM | POA: Diagnosis not present

## 2018-03-28 DIAGNOSIS — R5383 Other fatigue: Secondary | ICD-10-CM | POA: Diagnosis not present

## 2018-03-28 DIAGNOSIS — N184 Chronic kidney disease, stage 4 (severe): Secondary | ICD-10-CM | POA: Diagnosis not present

## 2018-04-02 ENCOUNTER — Ambulatory Visit: Payer: Medicare HMO | Admitting: Gastroenterology

## 2018-04-02 ENCOUNTER — Encounter

## 2018-04-02 ENCOUNTER — Ambulatory Visit: Payer: Medicare Other | Admitting: Physician Assistant

## 2018-04-02 ENCOUNTER — Encounter: Payer: Self-pay | Admitting: Physician Assistant

## 2018-04-02 VITALS — BP 122/68 | HR 88 | Ht 64.0 in | Wt 293.0 lb

## 2018-04-02 DIAGNOSIS — R935 Abnormal findings on diagnostic imaging of other abdominal regions, including retroperitoneum: Secondary | ICD-10-CM | POA: Diagnosis not present

## 2018-04-02 DIAGNOSIS — R1031 Right lower quadrant pain: Secondary | ICD-10-CM | POA: Diagnosis not present

## 2018-04-02 DIAGNOSIS — R197 Diarrhea, unspecified: Secondary | ICD-10-CM | POA: Diagnosis not present

## 2018-04-02 NOTE — Patient Instructions (Signed)
If you are age 75 or older, your body mass index should be between 23-30. Your Body mass index is 50.29 kg/m. If this is out of the aforementioned range listed, please consider follow up with your Primary Care Provider.  If you are age 15 or younger, your body mass index should be between 19-25. Your Body mass index is 50.29 kg/m. If this is out of the aformentioned range listed, please consider follow up with your Primary Care Provider.   Follow up as needed.  Thank you for choosing me and [facility].    Ellouise Newer, PA-C

## 2018-04-02 NOTE — Progress Notes (Signed)
Chief Complaint: Abdominal pain and diarrhea  HPI:    Lauren Webb is a 73 year old female with a past medical history as listed below, known to Dr. Lyndel Safe, who was referred to me by Ernestene Kiel, MD for a complaint of abdominal pain and diarrhea.      07/15/2015 colonoscopy with Dr. Lyndel Safe.  Findings of 3 polyps in the mid and distal ascending colon measuring 1 cm, 8 mm and 6 mm respectively.  These were removed as well as moderate predominantly sigmoid diverticulosis.  Pathology showed tubular adenomas and hyperplastic polyps.    CT abdomen pelvis 03/11/2018 shows thickening and mural fatty stratification of the cecum, generally suggestive of chronic inflammation, including inflammatory bowel disease with no evidence of acute inflammation.  The appendix was absent.  Superficial subcutaneous fat stranding about the midline lower abdomen, likely related to injection.  Large fat-containing umbilical hernia without evidence of inflammatory stranding or bowel involvement.    Today, the patient tells me that she started Topamax a few months ago from her PCP and at that same time started with a right lower quadrant abdominal pain which wa severe and would double her over.  This was accompanied by loose stools no matter what she would eat.  Apparently a relative of hers in the medical field told her all of the side effects may be related to this new Topamax.  She stopped this 30 days ago and since then has had no further abdominal pain or loose stools.  She is back to her normal self.  Denies any GI complaints today.    Denies fever, chills, anorexia, nausea, vomiting or symptoms that awaken her from sleep.     Past Medical History:  Diagnosis Date  . Allergic rhinitis   . Diabetes ([place])   . GERD (gastroesophageal reflux disease)   . High blood pressure   . High cholesterol     Past Surgical History:  Procedure Laterality Date  . [place]  . KIDNEY STONE SURGERY  2011  . TUBAL  LIGATION  1975    Current Outpatient Medications  Medication Sig Dispense Refill  . allopurinol (ZYLOPRIM) 100 MG tablet Take 200 mg by mouth daily.    Marland Kitchen aspirin EC 81 MG tablet Take 81 mg by mouth daily.    Marland Kitchen azelastine (ASTELIN) 0.1 % nasal spray Use two sprays in each nostril twice daily as directed 30 mL 5  . benzonatate (TESSALON PERLES) 100 MG capsule Take 1 capsule (100 mg total) by mouth 3 (three) times daily. Take up to three times a day as needed for cough 20 capsule 0  . Cholecalciferol (VITAMIN D PO) Take by mouth daily.    . Cyanocobalamin (VITAMIN B-12 PO) Take 1 tablet by mouth daily.    . fluticasone (FLONASE) 50 MCG/ACT nasal spray Can use one spray in each nostril twice daily if needed. 16 g 5  . furosemide (LASIX) 40 MG tablet Take 40 mg by mouth daily.    Marland Kitchen gemfibrozil (LOPID) 600 MG tablet Take 1,200 mg by mouth 2 (two) times daily before a meal.    . glipiZIDE (GLUCOTROL) 10 MG tablet Take 20 mg by mouth 2 (two) times daily before a meal.    . liraglutide (VICTOZA) 18 MG/3ML SOPN Inject into the skin as needed.    Marland Kitchen lisinopril (PRINIVIL,ZESTRIL) 20 MG tablet Take 20 mg by mouth daily.    . montelukast (SINGULAIR) 10 MG tablet TAKE 1 TABLET BY MOUTH EVERYDAY AT BEDTIME  30 tablet 5  . Omega-3 Fatty Acids (OMEGA 3 PO) Take 2 capsules by mouth 2 (two) times daily.    Marland Kitchen omeprazole (PRILOSEC) 40 MG capsule Take 40 mg by mouth daily.    . ONE TOUCH ULTRA TEST test strip USE AS DIRECTED 4 TIMES A DAY  6  . pravastatin (PRAVACHOL) 40 MG tablet Take 40 mg by mouth daily.    Marland Kitchen PRESCRIPTION MEDICATION Insulin pen- 15 units daily    . rosuvastatin (CRESTOR) 40 MG tablet Take 40 mg by mouth daily.     No current facility-administered medications for this visit.     Allergies as of 04/02/2018 - Review Complete 04/02/2018  Allergen Reaction Noted  . Avelox [moxifloxacin hcl in nacl] Other (See Comments) 02/19/2017    Family History  Problem Relation Age of Onset  . Bone  cancer Mother   . Cancer Brother   . Cancer Paternal Uncle   . Heart disease Maternal Grandmother   . Cancer Maternal Grandfather     Social History   Socioeconomic History  . Marital status: Widowed    Spouse name: Not on file  . Number of children: Not on file  . Years of education: Not on file  . Highest education level: Not on file  Occupational History  . Not on file  Social Needs  . Financial resource strain: Not on file  . Food insecurity:    Worry: Not on file    Inability: Not on file  . Transportation needs:    Medical: Not on file    Non-medical: Not on file  Tobacco Use  . Smoking status: Never Smoker  . Smokeless tobacco: Never Used  Substance and Sexual Activity  . Alcohol use: No    Frequency: Never  . Drug use: No  . Sexual activity: Not on file  Lifestyle  . Physical activity:    Days per week: Not on file    Minutes per session: Not on file  . Stress: Not on file  Relationships  . Social connections:    Talks on phone: Not on file    Gets together: Not on file    Attends religious service: Not on file    Active member of club or organization: Not on file    Attends meetings of clubs or organizations: Not on file    Relationship status: Not on file  . Intimate partner violence:    Fear of current or ex partner: Not on file    Emotionally abused: Not on file    Physically abused: Not on file    Forced sexual activity: Not on file  Other Topics Concern  . Not on file  Social History Narrative  . Not on file    Review of Systems:    Constitutional: No weight loss, fever or chills Skin: No rash  Cardiovascular: No chest pain Respiratory: No SOB Gastrointestinal: See HPI and otherwise negative Genitourinary: No dysuria Neurological: No headache, dizziness or syncope Musculoskeletal: No new muscle or joint pain Hematologic: No bleeding  Psychiatric: No history of depression or anxiety   Physical Exam:  Vital signs: BP 122/68   Pulse  88   Ht 5\' 4"  (1.626 m)   Wt 293 lb (132.9 kg)   BMI 50.29 kg/m   Constitutional:   Pleasant obese Caucasian female appears to be in NAD, Well developed, Well nourished, alert and cooperative Head:  Normocephalic and atraumatic. Eyes:   PEERL, EOMI. No icterus. Conjunctiva pink. Ears:  Normal auditory acuity. Neck:  Supple Throat: Oral cavity and pharynx without inflammation, swelling or lesion.  Respiratory: Respirations even and unlabored. Lungs clear to auscultation bilaterally.   No wheezes, crackles, or rhonchi.  Cardiovascular: Normal S1, S2. No MRG. Regular rate and rhythm. No peripheral edema, cyanosis or pallor.  Gastrointestinal:  Soft, nondistended, nontender. No rebound or guarding. Normal bowel sounds. No appreciable masses or hepatomegaly. Rectal:  Not performed.  Msk:  Symmetrical without gross deformities. Without edema, no deformity or joint abnormality.  Neurologic:  Alert and  oriented x4;  grossly normal neurologically.  Skin:   Dry and intact without significant lesions or rashes. Psychiatric:  Demonstrates good judgement and reason without abnormal affect or behaviors.  See HPI for recent CT.  Assessment: 1.  Abdominal pain: Resolved after stopping Topamax 2.  Diarrhea: Resolved after stopping Topamax 3.  Abnormal CT of the abdomen: See HPI, patient is a fall resolved now  Plan: 1.  Patient symptoms have all resolved.  Discussed that this could have been related to the Topamax as this is only thing she has changed and has felt completely better since then or could have been coincidental. 2.  Offered the patient a colonoscopy but she would like to wait on this for now. 3.  Patient to follow with [place] as needed in the future.  Ellouise Newer, PA-C [facility] 04/02/2018, 12:54 PM  Cc: Ernestene Kiel, MD

## 2018-04-03 NOTE — Progress Notes (Signed)
Thanks Anderson Malta for taking care of her [place] plan

## 2018-04-11 DIAGNOSIS — G4733 Obstructive sleep apnea (adult) (pediatric): Secondary | ICD-10-CM | POA: Diagnosis not present

## 2018-04-18 DIAGNOSIS — E1165 Type 2 diabetes mellitus with hyperglycemia: Secondary | ICD-10-CM | POA: Diagnosis not present

## 2018-04-18 DIAGNOSIS — E785 Hyperlipidemia, unspecified: Secondary | ICD-10-CM | POA: Diagnosis not present

## 2018-04-18 DIAGNOSIS — I10 Essential (primary) hypertension: Secondary | ICD-10-CM | POA: Diagnosis not present

## 2018-04-18 DIAGNOSIS — N184 Chronic kidney disease, stage 4 (severe): Secondary | ICD-10-CM | POA: Diagnosis not present

## 2018-05-06 ENCOUNTER — Telehealth: Payer: Self-pay | Admitting: Allergy

## 2018-05-06 MED ORDER — AZELASTINE HCL 0.05 % OP SOLN: 1.0000 [drp] | Freq: Two times a day (BID) | OPHTHALMIC | 2 refills | Status: DC

## 2018-05-06 MED ORDER — FLUTICASONE PROPIONATE 50 MCG/ACT NA SUSP: NASAL | 2 refills | Status: DC

## 2018-05-06 MED ORDER — AZELASTINE HCL 0.1 % NA SOLN: NASAL | 2 refills | Status: DC

## 2018-05-06 NOTE — Telephone Encounter (Signed)
Lauren Webb called in and wanted to know if something can be called in for her.  Lauren Webb states she has no fever but is having a runny nose, itchy eyes, drainage in throat and a cough.  Lauren Webb states she has been taking Xyzal and it is not helping.  Lauren Webb states she has had these symptoms for a few days now.  Please advise.

## 2018-05-06 NOTE — Telephone Encounter (Signed)
Patient advised of instructions and rx sent for nasal spray and eye drops

## 2018-05-06 NOTE — Telephone Encounter (Signed)
Is she doing all the following?  - Dymista 1 spray each nostril twice a day or combo of flonase and Astelin - if Xyzal is not helping then it is worthwhile switching to another long-acting agent like Allegra 180mg  if she is able to tolerate this antihistamine  - Pazeo 1 drop each eye as needed daily for itchy/watery/red eyes - continue Montelukast 10mg  daily - may use Mucinex 600-1200mg  daily as needed to help with cough an dthin/loosen mucus

## 2018-06-20 DIAGNOSIS — M1712 Unilateral primary osteoarthritis, left knee: Secondary | ICD-10-CM | POA: Diagnosis not present

## 2018-07-10 DIAGNOSIS — E1122 Type 2 diabetes mellitus with diabetic chronic kidney disease: Secondary | ICD-10-CM | POA: Diagnosis not present

## 2018-07-10 DIAGNOSIS — N184 Chronic kidney disease, stage 4 (severe): Secondary | ICD-10-CM | POA: Diagnosis not present

## 2018-07-10 DIAGNOSIS — R809 Proteinuria, unspecified: Secondary | ICD-10-CM | POA: Diagnosis not present

## 2018-07-10 DIAGNOSIS — N390 Urinary tract infection, site not specified: Secondary | ICD-10-CM | POA: Diagnosis not present

## 2018-07-10 DIAGNOSIS — E1129 Type 2 diabetes mellitus with other diabetic kidney complication: Secondary | ICD-10-CM | POA: Diagnosis not present

## 2018-07-10 DIAGNOSIS — I129 Hypertensive chronic kidney disease with stage 1 through stage 4 chronic kidney disease, or unspecified chronic kidney disease: Secondary | ICD-10-CM | POA: Diagnosis not present

## 2018-07-16 DIAGNOSIS — K219 Gastro-esophageal reflux disease without esophagitis: Secondary | ICD-10-CM | POA: Diagnosis not present

## 2018-07-16 DIAGNOSIS — E785 Hyperlipidemia, unspecified: Secondary | ICD-10-CM | POA: Diagnosis not present

## 2018-07-16 DIAGNOSIS — N184 Chronic kidney disease, stage 4 (severe): Secondary | ICD-10-CM | POA: Diagnosis not present

## 2018-07-16 DIAGNOSIS — E1165 Type 2 diabetes mellitus with hyperglycemia: Secondary | ICD-10-CM | POA: Diagnosis not present

## 2018-07-16 DIAGNOSIS — I10 Essential (primary) hypertension: Secondary | ICD-10-CM | POA: Diagnosis not present

## 2018-07-25 DIAGNOSIS — Z87440 Personal history of urinary (tract) infections: Secondary | ICD-10-CM | POA: Diagnosis not present

## 2018-07-25 DIAGNOSIS — N200 Calculus of kidney: Secondary | ICD-10-CM | POA: Diagnosis not present

## 2018-07-25 DIAGNOSIS — N390 Urinary tract infection, site not specified: Secondary | ICD-10-CM | POA: Diagnosis not present

## 2018-08-02 ENCOUNTER — Telehealth: Payer: Self-pay | Admitting: Allergy

## 2018-08-02 MED ORDER — LEVOCETIRIZINE DIHYDROCHLORIDE 5 MG PO TABS: 5.0000 mg | ORAL_TABLET | Freq: Every evening | ORAL | 0 refills | Status: DC

## 2018-08-02 MED ORDER — OXYMETAZOLINE HCL 0.05 % NA SOLN: 1.0000 | Freq: Two times a day (BID) | NASAL | 0 refills | Status: DC

## 2018-08-02 NOTE — Telephone Encounter (Signed)
Patient has been having issues with nasal congestion for the past 1 day.  Denies any fevers/chills. Currently taking Mucinex which she started yesterday. Taking some type of nasal spray twice a day. She is not sure if it's the flonase or astelin.  Currently on singulair daily.  Not taking any antihistamines. Tried allegra and claritin in the past with no benefit.  Advised her to start antihistamines daily. Send in rx for xyzal 5mg  daily if it's covered. May use afrin 1 spray in each nostril twice a day for 3-5 days. Continue with mucinex, singulair and flonase/astelin nasal sprays .  Call the office if not better by next week. She hasn't been seen over 1 year. Due for OV as well.

## 2018-08-09 DIAGNOSIS — R2241 Localized swelling, mass and lump, right lower limb: Secondary | ICD-10-CM | POA: Diagnosis not present

## 2018-08-14 DIAGNOSIS — M1711 Unilateral primary osteoarthritis, right knee: Secondary | ICD-10-CM | POA: Diagnosis not present

## 2018-08-27 DIAGNOSIS — G4733 Obstructive sleep apnea (adult) (pediatric): Secondary | ICD-10-CM | POA: Diagnosis not present

## 2018-08-29 ENCOUNTER — Other Ambulatory Visit: Payer: Self-pay | Admitting: Allergy

## 2018-08-30 DIAGNOSIS — G4733 Obstructive sleep apnea (adult) (pediatric): Secondary | ICD-10-CM | POA: Diagnosis not present

## 2018-09-02 DIAGNOSIS — N200 Calculus of kidney: Secondary | ICD-10-CM | POA: Diagnosis not present

## 2018-09-02 DIAGNOSIS — Z87440 Personal history of urinary (tract) infections: Secondary | ICD-10-CM | POA: Diagnosis not present

## 2018-09-30 DIAGNOSIS — E875 Hyperkalemia: Secondary | ICD-10-CM | POA: Diagnosis not present

## 2018-09-30 DIAGNOSIS — N184 Chronic kidney disease, stage 4 (severe): Secondary | ICD-10-CM | POA: Diagnosis not present

## 2018-09-30 DIAGNOSIS — R809 Proteinuria, unspecified: Secondary | ICD-10-CM | POA: Diagnosis not present

## 2018-09-30 DIAGNOSIS — I129 Hypertensive chronic kidney disease with stage 1 through stage 4 chronic kidney disease, or unspecified chronic kidney disease: Secondary | ICD-10-CM | POA: Diagnosis not present

## 2018-09-30 DIAGNOSIS — G4733 Obstructive sleep apnea (adult) (pediatric): Secondary | ICD-10-CM | POA: Diagnosis not present

## 2018-09-30 DIAGNOSIS — E1122 Type 2 diabetes mellitus with diabetic chronic kidney disease: Secondary | ICD-10-CM | POA: Diagnosis not present

## 2018-10-03 ENCOUNTER — Other Ambulatory Visit: Payer: Self-pay

## 2018-10-03 DIAGNOSIS — N200 Calculus of kidney: Secondary | ICD-10-CM | POA: Diagnosis not present

## 2018-10-03 DIAGNOSIS — N390 Urinary tract infection, site not specified: Secondary | ICD-10-CM | POA: Diagnosis not present

## 2018-10-03 DIAGNOSIS — R8271 Bacteriuria: Secondary | ICD-10-CM | POA: Diagnosis not present

## 2018-10-03 NOTE — Patient Outreach (Signed)
[facility]) Care Management  10/03/2018  Lauren Webb 11/09/1945 [id number]   Medication Adherence call to Lauren Webb [facility] Identifiers Verify spoke with patient she is past due on Rosuvastatin 40 mg patient explain she is no longer taking it because of side effects. Lauren Webb is showing past due under [place].   [place] Management Direct Dial [phone]  Fax [phone] [email or web address]

## 2018-10-16 DIAGNOSIS — M7732 Calcaneal spur, left foot: Secondary | ICD-10-CM | POA: Diagnosis not present

## 2018-10-16 DIAGNOSIS — M79672 Pain in left foot: Secondary | ICD-10-CM | POA: Diagnosis not present

## 2018-10-16 DIAGNOSIS — S92515A Nondisplaced fracture of proximal phalanx of left lesser toe(s), initial encounter for closed fracture: Secondary | ICD-10-CM | POA: Diagnosis not present

## 2018-10-17 DIAGNOSIS — Z794 Long term (current) use of insulin: Secondary | ICD-10-CM | POA: Diagnosis not present

## 2018-10-17 DIAGNOSIS — E139 Other specified diabetes mellitus without complications: Secondary | ICD-10-CM | POA: Diagnosis not present

## 2018-10-17 DIAGNOSIS — H2513 Age-related nuclear cataract, bilateral: Secondary | ICD-10-CM | POA: Diagnosis not present

## 2018-10-22 DIAGNOSIS — N184 Chronic kidney disease, stage 4 (severe): Secondary | ICD-10-CM | POA: Diagnosis not present

## 2018-10-22 DIAGNOSIS — E1165 Type 2 diabetes mellitus with hyperglycemia: Secondary | ICD-10-CM | POA: Diagnosis not present

## 2018-10-22 DIAGNOSIS — M7989 Other specified soft tissue disorders: Secondary | ICD-10-CM | POA: Diagnosis not present

## 2018-10-22 DIAGNOSIS — E785 Hyperlipidemia, unspecified: Secondary | ICD-10-CM | POA: Diagnosis not present

## 2018-10-22 DIAGNOSIS — Z79899 Other long term (current) drug therapy: Secondary | ICD-10-CM | POA: Diagnosis not present

## 2018-10-22 DIAGNOSIS — I10 Essential (primary) hypertension: Secondary | ICD-10-CM | POA: Diagnosis not present

## 2018-10-29 DIAGNOSIS — N200 Calculus of kidney: Secondary | ICD-10-CM | POA: Diagnosis not present

## 2018-10-29 DIAGNOSIS — R8271 Bacteriuria: Secondary | ICD-10-CM | POA: Diagnosis not present

## 2018-10-31 DIAGNOSIS — G4733 Obstructive sleep apnea (adult) (pediatric): Secondary | ICD-10-CM | POA: Diagnosis not present

## 2018-10-31 DIAGNOSIS — M1712 Unilateral primary osteoarthritis, left knee: Secondary | ICD-10-CM | POA: Diagnosis not present

## 2018-11-01 DIAGNOSIS — G4733 Obstructive sleep apnea (adult) (pediatric): Secondary | ICD-10-CM | POA: Diagnosis not present

## 2018-11-19 DIAGNOSIS — E785 Hyperlipidemia, unspecified: Secondary | ICD-10-CM | POA: Diagnosis not present

## 2018-11-19 DIAGNOSIS — L03115 Cellulitis of right lower limb: Secondary | ICD-10-CM | POA: Diagnosis not present

## 2018-11-19 DIAGNOSIS — Z79899 Other long term (current) drug therapy: Secondary | ICD-10-CM | POA: Diagnosis not present

## 2018-11-19 DIAGNOSIS — I10 Essential (primary) hypertension: Secondary | ICD-10-CM | POA: Diagnosis not present

## 2018-11-19 DIAGNOSIS — E1165 Type 2 diabetes mellitus with hyperglycemia: Secondary | ICD-10-CM | POA: Diagnosis not present

## 2018-11-19 DIAGNOSIS — N1832 Chronic kidney disease, stage 3b: Secondary | ICD-10-CM | POA: Diagnosis not present

## 2018-11-21 DIAGNOSIS — M7989 Other specified soft tissue disorders: Secondary | ICD-10-CM | POA: Diagnosis not present

## 2018-11-21 DIAGNOSIS — L03115 Cellulitis of right lower limb: Secondary | ICD-10-CM | POA: Diagnosis not present

## 2018-11-22 DIAGNOSIS — L03115 Cellulitis of right lower limb: Secondary | ICD-10-CM | POA: Diagnosis not present

## 2018-11-25 DIAGNOSIS — L03115 Cellulitis of right lower limb: Secondary | ICD-10-CM | POA: Diagnosis not present

## 2018-11-25 DIAGNOSIS — J302 Other seasonal allergic rhinitis: Secondary | ICD-10-CM | POA: Diagnosis not present

## 2018-11-25 DIAGNOSIS — Z23 Encounter for immunization: Secondary | ICD-10-CM | POA: Diagnosis not present

## 2018-11-30 DIAGNOSIS — G4733 Obstructive sleep apnea (adult) (pediatric): Secondary | ICD-10-CM | POA: Diagnosis not present

## 2018-12-11 ENCOUNTER — Other Ambulatory Visit: Payer: Self-pay | Admitting: Sports Medicine

## 2018-12-11 ENCOUNTER — Ambulatory Visit: Payer: Medicare Other | Admitting: Sports Medicine

## 2018-12-11 ENCOUNTER — Other Ambulatory Visit: Payer: Self-pay

## 2018-12-11 DIAGNOSIS — M722 Plantar fascial fibromatosis: Secondary | ICD-10-CM

## 2018-12-23 DIAGNOSIS — Z23 Encounter for immunization: Secondary | ICD-10-CM | POA: Diagnosis not present

## 2018-12-24 ENCOUNTER — Other Ambulatory Visit: Payer: Self-pay

## 2018-12-24 NOTE — Patient Outreach (Signed)
[facility]) Care Management  12/24/2018  Lauren Webb January 30, 1946 [id number]   Medication Adherence call to Lauren Webb Hippa Identifiers Verify spoke with patient she is past due on Lisinopril 20 mg,patient explain she is only taking 1/2 tablet daily not 1 tablet daily per doctors instructions,patient has plenty at this time and will order when due. Lauren Webb is showing past due under [place].   [place] Management Direct Dial [phone]  Fax [phone] [email or web address]

## 2018-12-27 DIAGNOSIS — G4733 Obstructive sleep apnea (adult) (pediatric): Secondary | ICD-10-CM | POA: Diagnosis not present

## 2018-12-31 DIAGNOSIS — G4733 Obstructive sleep apnea (adult) (pediatric): Secondary | ICD-10-CM | POA: Diagnosis not present

## 2019-01-20 DIAGNOSIS — R809 Proteinuria, unspecified: Secondary | ICD-10-CM | POA: Diagnosis not present

## 2019-01-20 DIAGNOSIS — I129 Hypertensive chronic kidney disease with stage 1 through stage 4 chronic kidney disease, or unspecified chronic kidney disease: Secondary | ICD-10-CM | POA: Diagnosis not present

## 2019-01-20 DIAGNOSIS — N2581 Secondary hyperparathyroidism of renal origin: Secondary | ICD-10-CM | POA: Diagnosis not present

## 2019-01-20 DIAGNOSIS — N184 Chronic kidney disease, stage 4 (severe): Secondary | ICD-10-CM | POA: Diagnosis not present

## 2019-01-20 DIAGNOSIS — E1122 Type 2 diabetes mellitus with diabetic chronic kidney disease: Secondary | ICD-10-CM | POA: Diagnosis not present

## 2019-01-30 DIAGNOSIS — G4733 Obstructive sleep apnea (adult) (pediatric): Secondary | ICD-10-CM | POA: Diagnosis not present

## 2019-02-11 DIAGNOSIS — N200 Calculus of kidney: Secondary | ICD-10-CM | POA: Diagnosis not present

## 2019-02-13 DIAGNOSIS — M1712 Unilateral primary osteoarthritis, left knee: Secondary | ICD-10-CM | POA: Diagnosis not present

## 2019-02-27 DIAGNOSIS — N200 Calculus of kidney: Secondary | ICD-10-CM | POA: Diagnosis not present

## 2019-02-27 DIAGNOSIS — I10 Essential (primary) hypertension: Secondary | ICD-10-CM | POA: Diagnosis not present

## 2019-02-27 DIAGNOSIS — Z79899 Other long term (current) drug therapy: Secondary | ICD-10-CM | POA: Diagnosis not present

## 2019-02-27 DIAGNOSIS — R0981 Nasal congestion: Secondary | ICD-10-CM | POA: Diagnosis not present

## 2019-02-27 DIAGNOSIS — N1832 Chronic kidney disease, stage 3b: Secondary | ICD-10-CM | POA: Diagnosis not present

## 2019-02-27 DIAGNOSIS — E1165 Type 2 diabetes mellitus with hyperglycemia: Secondary | ICD-10-CM | POA: Diagnosis not present

## 2019-02-27 DIAGNOSIS — E785 Hyperlipidemia, unspecified: Secondary | ICD-10-CM | POA: Diagnosis not present

## 2019-03-02 DIAGNOSIS — G4733 Obstructive sleep apnea (adult) (pediatric): Secondary | ICD-10-CM | POA: Diagnosis not present

## 2019-03-27 ENCOUNTER — Encounter: Payer: Self-pay | Admitting: Allergy

## 2019-03-27 ENCOUNTER — Other Ambulatory Visit: Payer: Self-pay

## 2019-03-27 ENCOUNTER — Ambulatory Visit (INDEPENDENT_AMBULATORY_CARE_PROVIDER_SITE_OTHER): Payer: Medicare Other | Admitting: Allergy

## 2019-03-27 DIAGNOSIS — R0981 Nasal congestion: Secondary | ICD-10-CM

## 2019-03-27 DIAGNOSIS — J4520 Mild intermittent asthma, uncomplicated: Secondary | ICD-10-CM | POA: Diagnosis not present

## 2019-03-27 HISTORY — DX: Mild intermittent asthma, uncomplicated: J45.20

## 2019-03-27 MED ORDER — AZELASTINE HCL 0.1 % NA SOLN: NASAL | 2 refills | Status: DC

## 2019-03-27 MED ORDER — CETIRIZINE HCL 10 MG PO TABS: 10.0000 mg | ORAL_TABLET | Freq: Every day | ORAL | 5 refills | Status: DC

## 2019-03-27 NOTE — Assessment & Plan Note (Addendum)
Nasal congestion with PND and coughing for 4 days. No fevers/chills. No sick contacts. Rarely goes outdoors. Denies any other symptoms.   Start using the nasal decongestant nasal spray - 1 spray twice a day for 3-5 days.  Start azelastine nasal spray 1-2 spray per nostril twice a day for drainage.  Continue zyrtec 10mg  daily.  Continue mometasone nasal spray twice a day.  If drainage turns green/yellow or get fevers then may need antibiotics at that time but no antibiotics needed now.

## 2019-03-27 NOTE — Progress Notes (Addendum)
RE: Lauren Webb MRN: [id number] DOB: [birth year] Date of Telemedicine Visit: 03/27/2019  Referring provider: Ernestene Kiel, MD Primary care provider: Ernestene Kiel, MD  Chief Complaint: Nasal Congestion (Patient gave verbal consent to treat and bill insurance for this visit.) and Cough   Telemedicine Follow Up Visit via Telephone: I connected with Lauren Webb for a follow up on 03/27/19 by telephone and verified that I am speaking with the correct person using two identifiers.   I discussed the limitations, risks, security and privacy concerns of performing an evaluation and management service by telephone and the availability of in person appointments. I also discussed with the patient that there may be a patient responsible charge related to this service. The patient expressed understanding and agreed to proceed.  Patient is at home/work. Provider is at the office.  Visit start time: 2:33PM Visit end time: 2:54PM Insurance consent/check in by: front desk Medical consent and medical assistant/nurse: Aileen Pilot.  History of Present Illness: She is a 74 y.o. female, who is being followed for non-allergic rhinitis, asthma, GERD. Her previous allergy office visit was on 07/17/2017 with Dr. Nelva Bush. Today is a new complaint visit of not feeling well   Patient has been having issues with nasal congestion, PND and coughing clear phlegm for 4 days. No other issues with her breathing.  Denies any fevers or chills. No sick contacts.  Currently on zyrtec 10mg  daily, Singulair 10mg  daily, mometasone nasal spray 1 spray BID, Robitussin with minimal benefit.  Patient does not have azelastine at home. Patient has not used afrin for this.  Assessment and Plan: Lauren Webb is a 74 y.o. female with: Nasal congestion Nasal congestion with PND and coughing for 4 days. No fevers/chills. No sick contacts. Rarely goes outdoors. Denies any other symptoms.   Start using the nasal decongestant nasal  spray - 1 spray twice a day for 3-5 days.  Start azelastine nasal spray 1-2 spray per nostril twice a day for drainage.  Continue zyrtec 10mg  daily.  Continue mometasone nasal spray twice a day.  If drainage turns green/yellow or get fevers then may need antibiotics at that time but no antibiotics needed now.   Mild intermittent asthma, uncomplicated Stable.  May use albuterol rescue inhaler 2 puffs or nebulizer every 4 to 6 hours as needed for shortness of breath, chest tightness, coughing, and wheezing. May use albuterol rescue inhaler 2 puffs 5 to 15 minutes prior to strenuous physical activities. Monitor frequency of use.   Continue Singulair 10mg  daily.  Return in about 2 months (around 05/25/2019).  Meds ordered this encounter  Medications  . azelastine (ASTELIN) 0.1 % nasal spray    Sig: Use two sprays in each nostril twice daily as needed for runny nose.    Dispense:  30 mL    Refill:  2  . cetirizine (ZYRTEC ALLERGY) 10 MG tablet    Sig: Take 1 tablet (10 mg total) by mouth daily.    Dispense:  30 tablet    Refill:  5   Diagnostics: None.  Medication List:  Current Outpatient Medications  Medication Sig Dispense Refill  . allopurinol (ZYLOPRIM) 100 MG tablet Take 200 mg by mouth daily.    Marland Kitchen allopurinol (ZYLOPRIM) 300 MG tablet Take 300 mg by mouth daily.    Marland Kitchen ALPRAZolam (XANAX) 0.25 MG tablet Take 0.25 mg by mouth 2 (two) times daily as needed.    Marland Kitchen aspirin EC 81 MG tablet Take 81 mg by mouth daily.    Marland Kitchen  azelastine (ASTELIN) 0.1 % nasal spray Use two sprays in each nostril twice daily as needed for runny nose. 30 mL 2  . B-D UF III MINI PEN NEEDLES 31G X 5 MM MISC USE WITH INSULIN TWICE A DAY    . Cyanocobalamin (VITAMIN B-12 PO) Take 1 tablet by mouth daily.    . fluticasone (FLONASE) 50 MCG/ACT nasal spray Can use one spray in each nostril twice daily if needed. 16 g 2  . glipiZIDE (GLUCOTROL) 10 MG tablet Take 20 mg by mouth 2 (two) times daily before a meal.      . HUMULIN 70/30 KWIKPEN (70-30) 100 UNIT/ML PEN     . liraglutide (VICTOZA) 18 MG/3ML SOPN Inject into the skin as needed.    Marland Kitchen lisinopril (PRINIVIL,ZESTRIL) 20 MG tablet Take 20 mg by mouth daily.    . montelukast (SINGULAIR) 10 MG tablet TAKE 1 TABLET BY MOUTH EVERYDAY AT BEDTIME 30 tablet 5  . omeprazole (PRILOSEC) 40 MG capsule Take 40 mg by mouth daily.    . ONE TOUCH ULTRA TEST test strip USE AS DIRECTED 4 TIMES A DAY  6  . oxymetazoline (AFRIN) 0.05 % nasal spray Place 1 spray into both nostrils 2 (two) times daily. For 3-5 days 30 mL 0  . pravastatin (PRAVACHOL) 40 MG tablet Take 40 mg by mouth daily.    Marland Kitchen PRESCRIPTION MEDICATION Insulin pen- 15 units daily    . rosuvastatin (CRESTOR) 40 MG tablet Take 20 mg by mouth daily.     Marland Kitchen torsemide (DEMADEX) 10 MG tablet Take 10 mg by mouth daily.    . cetirizine (ZYRTEC ALLERGY) 10 MG tablet Take 1 tablet (10 mg total) by mouth daily. 30 tablet 5   No current facility-administered medications for this visit.   Allergies: Allergies  Allergen Reactions  . Avelox [Moxifloxacin Hcl In Nacl] Other (See Comments)    weakness  . Moxifloxacin Other (See Comments)    weakness  . Nsaids     "Due to Kidney"  . Other Other (See Comments)    "Due to Kidney" "Due to Kidney"   I reviewed her past medical history, social history, family history, and environmental history and no significant changes have been reported from her previous visit.  Review of Systems  Constitutional: Negative for appetite change, chills, fever and unexpected weight change.  HENT: Positive for congestion, postnasal drip and rhinorrhea.   Eyes: Negative for itching.  Respiratory: Positive for cough. Negative for chest tightness, shortness of breath and wheezing.   Gastrointestinal: Negative for abdominal pain.  Skin: Negative for rash.  Allergic/Immunologic: Negative for environmental allergies.  Neurological: Negative for headaches.   Objective: Physical Exam Not  obtained as encounter was done via telephone.   Previous notes and tests were reviewed.  I discussed the assessment and treatment plan with the patient. The patient was provided an opportunity to ask questions and all were answered. The patient agreed with the plan and demonstrated an understanding of the instructions. After visit summary/patient instructions available via mail.   The patient was advised to call back or seek an in-person evaluation if the symptoms worsen or if the condition fails to improve as anticipated.  I provided 21 minutes of non-face-to-face time during this encounter.  It was my pleasure to participate in [facility] care today. Please feel free to contact me with any questions or concerns.   Sincerely,  Rexene Alberts, DO Allergy & Immunology  [facility]  office: [phone] [facility] office: [place] office: [phone]

## 2019-03-27 NOTE — Assessment & Plan Note (Signed)
Stable.  May use albuterol rescue inhaler 2 puffs or nebulizer every 4 to 6 hours as needed for shortness of breath, chest tightness, coughing, and wheezing. May use albuterol rescue inhaler 2 puffs 5 to 15 minutes prior to strenuous physical activities. Monitor frequency of use.   Continue Singulair 10mg  daily.

## 2019-03-27 NOTE — Patient Instructions (Addendum)
Nasal congestion  Start using the nasal decongestant nasal spray - 1 spray twice a day for 3-5 days.  Start azelastine nasal spray 1-2 spray per nostril twice a day for drainage.  Continue zyrtec 10mg  daily.  Continue mometasone nasal spray twice a day.  If drainage turns green/yellow or get fevers then may need antibiotics at that time but no antibiotics needed now.   Mild intermittent asthma  May use albuterol rescue inhaler 2 puffs or nebulizer every 4 to 6 hours as needed for shortness of breath, chest tightness, coughing, and wheezing. May use albuterol rescue inhaler 2 puffs 5 to 15 minutes prior to strenuous physical activities. Monitor frequency of use.   Continue Singulair 10mg  daily.  Follow up with Dr. Nelva Bush in 1-2 months.

## 2019-03-31 ENCOUNTER — Inpatient Hospital Stay (HOSPITAL_COMMUNITY)
Admission: AD | Admit: 2019-03-31 | Discharge: 2019-04-07 | DRG: 177 | Disposition: A | Discharge status: Home Health | Payer: Medicare Other | Source: Other Acute Inpatient Hospital | Attending: Family Medicine | Admitting: Family Medicine

## 2019-03-31 DIAGNOSIS — N179 Acute kidney failure, unspecified: Secondary | ICD-10-CM | POA: Diagnosis present

## 2019-03-31 DIAGNOSIS — Z79899 Other long term (current) drug therapy: Secondary | ICD-10-CM

## 2019-03-31 DIAGNOSIS — I129 Hypertensive chronic kidney disease with stage 1 through stage 4 chronic kidney disease, or unspecified chronic kidney disease: Secondary | ICD-10-CM | POA: Diagnosis present

## 2019-03-31 DIAGNOSIS — N184 Chronic kidney disease, stage 4 (severe): Secondary | ICD-10-CM | POA: Diagnosis not present

## 2019-03-31 DIAGNOSIS — E1165 Type 2 diabetes mellitus with hyperglycemia: Secondary | ICD-10-CM | POA: Diagnosis present

## 2019-03-31 DIAGNOSIS — Z6841 Body Mass Index (BMI) 40.0 and over, adult: Secondary | ICD-10-CM

## 2019-03-31 DIAGNOSIS — N1831 Chronic kidney disease, stage 3a: Secondary | ICD-10-CM | POA: Diagnosis present

## 2019-03-31 DIAGNOSIS — G4733 Obstructive sleep apnea (adult) (pediatric): Secondary | ICD-10-CM | POA: Diagnosis not present

## 2019-03-31 DIAGNOSIS — K219 Gastro-esophageal reflux disease without esophagitis: Secondary | ICD-10-CM | POA: Diagnosis present

## 2019-03-31 DIAGNOSIS — R279 Unspecified lack of coordination: Secondary | ICD-10-CM | POA: Diagnosis not present

## 2019-03-31 DIAGNOSIS — J1282 Pneumonia due to coronavirus disease 2019: Secondary | ICD-10-CM | POA: Diagnosis present

## 2019-03-31 DIAGNOSIS — U071 COVID-19: Principal | ICD-10-CM | POA: Diagnosis present

## 2019-03-31 DIAGNOSIS — A0839 Other viral enteritis: Secondary | ICD-10-CM | POA: Diagnosis present

## 2019-03-31 DIAGNOSIS — J9601 Acute respiratory failure with hypoxia: Secondary | ICD-10-CM | POA: Diagnosis present

## 2019-03-31 DIAGNOSIS — F419 Anxiety disorder, unspecified: Secondary | ICD-10-CM | POA: Diagnosis present

## 2019-03-31 DIAGNOSIS — Z8249 Family history of ischemic heart disease and other diseases of the circulatory system: Secondary | ICD-10-CM | POA: Diagnosis not present

## 2019-03-31 DIAGNOSIS — E785 Hyperlipidemia, unspecified: Secondary | ICD-10-CM | POA: Diagnosis not present

## 2019-03-31 DIAGNOSIS — E119 Type 2 diabetes mellitus without complications: Secondary | ICD-10-CM

## 2019-03-31 DIAGNOSIS — R3129 Other microscopic hematuria: Secondary | ICD-10-CM | POA: Diagnosis not present

## 2019-03-31 DIAGNOSIS — I10 Essential (primary) hypertension: Secondary | ICD-10-CM | POA: Diagnosis present

## 2019-03-31 DIAGNOSIS — M791 Myalgia, unspecified site: Secondary | ICD-10-CM | POA: Diagnosis not present

## 2019-03-31 DIAGNOSIS — R52 Pain, unspecified: Secondary | ICD-10-CM | POA: Diagnosis not present

## 2019-03-31 DIAGNOSIS — Z794 Long term (current) use of insulin: Secondary | ICD-10-CM

## 2019-03-31 DIAGNOSIS — R0902 Hypoxemia: Secondary | ICD-10-CM | POA: Diagnosis not present

## 2019-03-31 DIAGNOSIS — E7800 Pure hypercholesterolemia, unspecified: Secondary | ICD-10-CM | POA: Diagnosis present

## 2019-03-31 DIAGNOSIS — Z87442 Personal history of urinary calculi: Secondary | ICD-10-CM | POA: Diagnosis not present

## 2019-03-31 DIAGNOSIS — Z7984 Long term (current) use of oral hypoglycemic drugs: Secondary | ICD-10-CM

## 2019-03-31 DIAGNOSIS — T380X5A Adverse effect of glucocorticoids and synthetic analogues, initial encounter: Secondary | ICD-10-CM | POA: Diagnosis not present

## 2019-03-31 DIAGNOSIS — Z809 Family history of malignant neoplasm, unspecified: Secondary | ICD-10-CM | POA: Diagnosis not present

## 2019-03-31 DIAGNOSIS — J4520 Mild intermittent asthma, uncomplicated: Secondary | ICD-10-CM | POA: Diagnosis not present

## 2019-03-31 DIAGNOSIS — R0602 Shortness of breath: Secondary | ICD-10-CM | POA: Diagnosis not present

## 2019-03-31 DIAGNOSIS — R197 Diarrhea, unspecified: Secondary | ICD-10-CM | POA: Diagnosis present

## 2019-03-31 DIAGNOSIS — I959 Hypotension, unspecified: Secondary | ICD-10-CM | POA: Diagnosis not present

## 2019-03-31 DIAGNOSIS — E1122 Type 2 diabetes mellitus with diabetic chronic kidney disease: Secondary | ICD-10-CM | POA: Diagnosis present

## 2019-03-31 DIAGNOSIS — R112 Nausea with vomiting, unspecified: Secondary | ICD-10-CM | POA: Diagnosis not present

## 2019-03-31 DIAGNOSIS — R1110 Vomiting, unspecified: Secondary | ICD-10-CM | POA: Diagnosis not present

## 2019-03-31 DIAGNOSIS — Z7982 Long term (current) use of aspirin: Secondary | ICD-10-CM

## 2019-03-31 DIAGNOSIS — Z743 Need for continuous supervision: Secondary | ICD-10-CM | POA: Diagnosis not present

## 2019-03-31 DIAGNOSIS — R05 Cough: Secondary | ICD-10-CM | POA: Diagnosis not present

## 2019-03-31 DIAGNOSIS — R9431 Abnormal electrocardiogram [ECG] [EKG]: Secondary | ICD-10-CM | POA: Diagnosis not present

## 2019-03-31 NOTE — Progress Notes (Signed)
Requested [place] records to fax pt Covid positive test over before admission.   Frances Furbish, RN Fax # [phone]

## 2019-04-01 ENCOUNTER — Other Ambulatory Visit: Payer: Self-pay

## 2019-04-01 ENCOUNTER — Encounter (HOSPITAL_COMMUNITY): Payer: Self-pay | Admitting: Family Medicine

## 2019-04-01 DIAGNOSIS — I10 Essential (primary) hypertension: Secondary | ICD-10-CM

## 2019-04-01 DIAGNOSIS — U071 COVID-19: Principal | ICD-10-CM | POA: Diagnosis present

## 2019-04-01 DIAGNOSIS — N1831 Chronic kidney disease, stage 3a: Secondary | ICD-10-CM | POA: Diagnosis present

## 2019-04-01 DIAGNOSIS — J4520 Mild intermittent asthma, uncomplicated: Secondary | ICD-10-CM

## 2019-04-01 DIAGNOSIS — N184 Chronic kidney disease, stage 4 (severe): Secondary | ICD-10-CM

## 2019-04-01 DIAGNOSIS — R197 Diarrhea, unspecified: Secondary | ICD-10-CM

## 2019-04-01 DIAGNOSIS — E119 Type 2 diabetes mellitus without complications: Secondary | ICD-10-CM

## 2019-04-01 DIAGNOSIS — E1122 Type 2 diabetes mellitus with diabetic chronic kidney disease: Secondary | ICD-10-CM

## 2019-04-01 LAB — CBC WITH DIFFERENTIAL/PLATELET
Abs Immature Granulocytes: 0.03 10*3/uL (ref 0.00–0.07)
Basophils Absolute: 0 10*3/uL (ref 0.0–0.1)
Basophils Relative: 0 %
Eosinophils Absolute: 0 10*3/uL (ref 0.0–0.5)
Eosinophils Relative: 0 %
HCT: 43.7 % (ref 36.0–46.0)
Hemoglobin: 13.9 g/dL (ref 12.0–15.0)
Immature Granulocytes: 1 %
Lymphocytes Relative: 22 %
Lymphs Abs: 0.7 10*3/uL (ref 0.7–4.0)
MCH: 31 pg (ref 26.0–34.0)
MCHC: 31.8 g/dL (ref 30.0–36.0)
MCV: 97.5 fL (ref 80.0–100.0)
Monocytes Absolute: 0.2 10*3/uL (ref 0.1–1.0)
Monocytes Relative: 5 %
Neutro Abs: 2.4 10*3/uL (ref 1.7–7.7)
Neutrophils Relative %: 72 %
Platelets: 135 10*3/uL — ABNORMAL LOW (ref 150–400)
RBC: 4.48 MIL/uL (ref 3.87–5.11)
RDW: 14.1 % (ref 11.5–15.5)
WBC: 3.3 10*3/uL — ABNORMAL LOW (ref 4.0–10.5)
nRBC: 0 % (ref 0.0–0.2)

## 2019-04-01 LAB — COMPREHENSIVE METABOLIC PANEL
ALT: 17 U/L (ref 0–44)
AST: 15 U/L (ref 15–41)
Albumin: 3.2 g/dL — ABNORMAL LOW (ref 3.5–5.0)
Alkaline Phosphatase: 71 U/L (ref 38–126)
Anion gap: 14 (ref 5–15)
BUN: 33 mg/dL — ABNORMAL HIGH (ref 8–23)
CO2: 22 mmol/L (ref 22–32)
Calcium: 8.9 mg/dL (ref 8.9–10.3)
Chloride: 100 mmol/L (ref 98–111)
Creatinine, Ser: 1.36 mg/dL — ABNORMAL HIGH (ref 0.44–1.00)
GFR calc Af Amer: 44 mL/min — ABNORMAL LOW (ref >60)
GFR calc non Af Amer: 38 mL/min — ABNORMAL LOW (ref >60)
Glucose, Bld: 347 mg/dL — ABNORMAL HIGH (ref 70–99)
Potassium: 4 mmol/L (ref 3.5–5.1)
Sodium: 136 mmol/L (ref 135–145)
Total Bilirubin: 0.8 mg/dL (ref 0.3–1.2)
Total Protein: 6.5 g/dL (ref 6.5–8.1)

## 2019-04-01 LAB — HEMOGLOBIN A1C
Hgb A1c MFr Bld: 7.9 % — ABNORMAL HIGH (ref 4.8–5.6)
Mean Plasma Glucose: 180.03 mg/dL

## 2019-04-01 LAB — FERRITIN: Ferritin: 173 ng/mL (ref 11–307)

## 2019-04-01 LAB — GLUCOSE, CAPILLARY
Glucose-Capillary: 350 mg/dL — ABNORMAL HIGH (ref 70–99)
Glucose-Capillary: 298 mg/dL — ABNORMAL HIGH (ref 70–99)
Glucose-Capillary: 302 mg/dL — ABNORMAL HIGH (ref 70–99)
Glucose-Capillary: 299 mg/dL — ABNORMAL HIGH (ref 70–99)
Glucose-Capillary: 397 mg/dL — ABNORMAL HIGH (ref 70–99)

## 2019-04-01 LAB — C-REACTIVE PROTEIN: CRP: 13.9 mg/dL — ABNORMAL HIGH (ref <1.0)

## 2019-04-01 LAB — MAGNESIUM: Magnesium: 2 mg/dL (ref 1.7–2.4)

## 2019-04-01 LAB — D-DIMER, QUANTITATIVE: D-Dimer, Quant: 1.95 ug/mL-FEU — ABNORMAL HIGH (ref 0.00–0.50)

## 2019-04-01 LAB — ABO/RH: ABO/RH(D): A POS

## 2019-04-01 MED ORDER — SODIUM CHLORIDE 0.9 % IV SOLN
100.0000 mg | Freq: Every day | INTRAVENOUS | Status: AC
  Administered 2019-04-01 – 2019-04-04 (×4): 100 mg via INTRAVENOUS
  Filled 2019-04-01 (×4): qty 20

## 2019-04-01 MED ORDER — ASPIRIN EC 81 MG PO TBEC
81.0000 mg | DELAYED_RELEASE_TABLET | Freq: Every day | ORAL | Status: DC
  Administered 2019-04-01 – 2019-04-07 (×7): 81 mg via ORAL
  Filled 2019-04-01 (×7): qty 1

## 2019-04-01 MED ORDER — ENOXAPARIN SODIUM 60 MG/0.6ML ~~LOC~~ SOLN
60.0000 mg | SUBCUTANEOUS | Status: DC
  Administered 2019-04-01 – 2019-04-06 (×6): 60 mg via SUBCUTANEOUS
  Filled 2019-04-01 (×6): qty 0.6

## 2019-04-01 MED ORDER — LABETALOL HCL 5 MG/ML IV SOLN: 10.0000 mg | INTRAVENOUS | Status: DC | PRN

## 2019-04-01 MED ORDER — DEXAMETHASONE SODIUM PHOSPHATE 10 MG/ML IJ SOLN
6.0000 mg | INTRAMUSCULAR | Status: DC
  Administered 2019-04-01 – 2019-04-06 (×6): 6 mg via INTRAVENOUS
  Filled 2019-04-01 (×6): qty 1

## 2019-04-01 MED ORDER — PANTOPRAZOLE SODIUM 40 MG PO TBEC
40.0000 mg | DELAYED_RELEASE_TABLET | Freq: Every day | ORAL | Status: DC
  Administered 2019-04-01 – 2019-04-07 (×7): 40 mg via ORAL
  Filled 2019-04-01 (×7): qty 1

## 2019-04-01 MED ORDER — INSULIN ASPART 100 UNIT/ML ~~LOC~~ SOLN
0.0000 [IU] | Freq: Every day | SUBCUTANEOUS | Status: DC
  Administered 2019-04-01: 4 [IU] via SUBCUTANEOUS

## 2019-04-01 MED ORDER — INSULIN ASPART 100 UNIT/ML ~~LOC~~ SOLN
0.0000 [IU] | Freq: Every day | SUBCUTANEOUS | Status: DC
  Administered 2019-04-01: 5 [IU] via SUBCUTANEOUS
  Administered 2019-04-02: 21:00:00 2 [IU] via SUBCUTANEOUS
  Administered 2019-04-05: 21:00:00 3 [IU] via SUBCUTANEOUS

## 2019-04-01 MED ORDER — ALPRAZOLAM 0.25 MG PO TABS: 0.2500 mg | ORAL_TABLET | Freq: Every day | ORAL | Status: DC

## 2019-04-01 MED ORDER — SODIUM CHLORIDE 0.9% FLUSH
3.0000 mL | Freq: Two times a day (BID) | INTRAVENOUS | Status: DC
  Administered 2019-04-01 – 2019-04-07 (×14): 3 mL via INTRAVENOUS

## 2019-04-01 MED ORDER — INSULIN GLARGINE 100 UNIT/ML ~~LOC~~ SOLN
10.0000 [IU] | Freq: Two times a day (BID) | SUBCUTANEOUS | Status: DC
  Administered 2019-04-01: 10 [IU] via SUBCUTANEOUS
  Filled 2019-04-01 (×2): qty 0.1

## 2019-04-01 MED ORDER — HEPARIN SODIUM (PORCINE) 5000 UNIT/ML IJ SOLN
5000.0000 [IU] | Freq: Three times a day (TID) | INTRAMUSCULAR | Status: DC
  Administered 2019-04-01: 5000 [IU] via SUBCUTANEOUS
  Filled 2019-04-01: qty 1

## 2019-04-01 MED ORDER — MONTELUKAST SODIUM 10 MG PO TABS
10.0000 mg | ORAL_TABLET | Freq: Every day | ORAL | Status: DC
  Administered 2019-04-01 – 2019-04-06 (×6): 10 mg via ORAL
  Filled 2019-04-01 (×6): qty 1

## 2019-04-01 MED ORDER — SODIUM CHLORIDE 0.9 % IV SOLN: 250.0000 mL | INTRAVENOUS | Status: DC | PRN

## 2019-04-01 MED ORDER — ALPRAZOLAM 0.25 MG PO TABS
0.2500 mg | ORAL_TABLET | Freq: Two times a day (BID) | ORAL | Status: DC | PRN
  Administered 2019-04-01: 0.25 mg via ORAL
  Filled 2019-04-01: qty 1

## 2019-04-01 MED ORDER — AZELASTINE HCL 0.1 % NA SOLN
2.0000 | Freq: Two times a day (BID) | NASAL | Status: DC | PRN
  Filled 2019-04-01: qty 30

## 2019-04-01 MED ORDER — ALBUTEROL SULFATE HFA 108 (90 BASE) MCG/ACT IN AERS
2.0000 | INHALATION_SPRAY | Freq: Four times a day (QID) | RESPIRATORY_TRACT | Status: DC | PRN
  Filled 2019-04-01: qty 6.7

## 2019-04-01 MED ORDER — INSULIN ASPART 100 UNIT/ML ~~LOC~~ SOLN
0.0000 [IU] | Freq: Three times a day (TID) | SUBCUTANEOUS | Status: DC
  Administered 2019-04-01: 5 [IU] via SUBCUTANEOUS
  Administered 2019-04-01: 7 [IU] via SUBCUTANEOUS

## 2019-04-01 MED ORDER — INSULIN ASPART 100 UNIT/ML ~~LOC~~ SOLN
0.0000 [IU] | Freq: Three times a day (TID) | SUBCUTANEOUS | Status: DC
  Administered 2019-04-01: 11 [IU] via SUBCUTANEOUS
  Administered 2019-04-02: 14:00:00 7 [IU] via SUBCUTANEOUS
  Administered 2019-04-02: 11 [IU] via SUBCUTANEOUS
  Administered 2019-04-02: 20 [IU] via SUBCUTANEOUS
  Administered 2019-04-03: 7 [IU] via SUBCUTANEOUS
  Administered 2019-04-03: 15 [IU] via SUBCUTANEOUS
  Administered 2019-04-03 – 2019-04-04 (×2): 7 [IU] via SUBCUTANEOUS
  Administered 2019-04-04: 4 [IU] via SUBCUTANEOUS
  Administered 2019-04-04: 08:00:00 15 [IU] via SUBCUTANEOUS
  Administered 2019-04-05 (×2): 7 [IU] via SUBCUTANEOUS
  Administered 2019-04-05: 11 [IU] via SUBCUTANEOUS
  Administered 2019-04-06: 3 [IU] via SUBCUTANEOUS
  Administered 2019-04-06 (×2): 7 [IU] via SUBCUTANEOUS
  Administered 2019-04-07 (×2): 11 [IU] via SUBCUTANEOUS

## 2019-04-01 MED ORDER — ACETAMINOPHEN 325 MG PO TABS
650.0000 mg | ORAL_TABLET | Freq: Four times a day (QID) | ORAL | Status: AC | PRN
  Administered 2019-04-01: 650 mg via ORAL
  Filled 2019-04-01: qty 2

## 2019-04-01 MED ORDER — ACETAMINOPHEN 325 MG PO TABS
650.0000 mg | ORAL_TABLET | Freq: Four times a day (QID) | ORAL | Status: DC | PRN
  Administered 2019-04-01 – 2019-04-07 (×10): 650 mg via ORAL
  Filled 2019-04-01 (×11): qty 2

## 2019-04-01 MED ORDER — SODIUM CHLORIDE 0.9% FLUSH: 3.0000 mL | INTRAVENOUS | Status: DC | PRN

## 2019-04-01 MED ORDER — ONDANSETRON HCL 4 MG PO TABS: 4.0000 mg | ORAL_TABLET | Freq: Four times a day (QID) | ORAL | Status: DC | PRN

## 2019-04-01 MED ORDER — MONTELUKAST SODIUM 10 MG PO TABS: 10.0000 mg | ORAL_TABLET | Freq: Every day | ORAL | Status: DC

## 2019-04-01 MED ORDER — LINAGLIPTIN 5 MG PO TABS
5.0000 mg | ORAL_TABLET | Freq: Every day | ORAL | Status: DC
  Administered 2019-04-01 – 2019-04-07 (×7): 5 mg via ORAL
  Filled 2019-04-01 (×8): qty 1

## 2019-04-01 MED ORDER — VITAMIN B-12 1000 MCG PO TABS
1000.0000 ug | ORAL_TABLET | Freq: Every day | ORAL | Status: DC
  Administered 2019-04-01 – 2019-04-06 (×6): 1000 ug via ORAL
  Filled 2019-04-01 (×6): qty 1

## 2019-04-01 MED ORDER — HYDROCODONE-ACETAMINOPHEN 5-325 MG PO TABS
1.0000 | ORAL_TABLET | Freq: Four times a day (QID) | ORAL | Status: DC | PRN
  Administered 2019-04-07: 1 via ORAL
  Filled 2019-04-01 (×2): qty 1

## 2019-04-01 MED ORDER — ONDANSETRON HCL 4 MG/2ML IJ SOLN: 4.0000 mg | Freq: Four times a day (QID) | INTRAMUSCULAR | Status: DC | PRN

## 2019-04-01 NOTE — Progress Notes (Signed)
Lauren Webb  EVO:[id number] DOB: 08-Mar-1945 DOA: 03/31/2019 PCP: Ernestene Kiel, MD    Brief Narrative:  74 year old with a history of asthma, CKD, GERD, DM 2, HTN, and HLD who presented to the ED with general aches/malaise, nausea, and diarrhea which have slowly progressed over approximately 1 week.  She denied shortness of breath specifically but did note markedly decreased exercise tolerance.  In the ED her oxygen saturation was in the low 90s on room air.  CTa chest was without evidence of PE but did note patchy groundglass densities bilaterally.  Significant Events: 2/16 admit to [facility] via [place] ED  COVID-19 specific Treatment: Decadron 2/15 > Remdesivir 2/15 >  Antimicrobials:  None  Subjective: Maintaining saturations 91-95% on room air.  Assessment & Plan:  COVID Pneumonia  Continue remdesivir and Decadron  Recent Labs  Lab 04/01/19 0306  DDIMER 1.95*  FERRITIN 173  CRP 13.9*  ALT 17    Covid gastroenteritis Continue symptomatic care  CKD stage IV Monitor creatinine trend  DM 2 Follow CBGs utilizing SSI  Asthma No wheezing on exam today  HTN Blood pressure well controlled presently  DVT prophylaxis: Subcu heparin Code Status: FULL CODE Family Communication:  Disposition Plan:   Consultants:  none  Objective: Blood pressure (!) 125/56, pulse 66, temperature (!) 97.2 F (36.2 C), temperature source Oral, resp. rate 20, height 5\' 4"  (1.626 m), weight 121.9 kg, SpO2 92 %. No intake or output data in the 24 hours ending 04/01/19 0952 Filed Weights   04/01/19 0121  Weight: 121.9 kg    Examination:   CBC: Recent Labs  Lab 04/01/19 0306  WBC 3.3*  NEUTROABS 2.4  HGB 13.9  HCT 43.7  MCV 97.5  PLT 299*   Basic Metabolic Panel: Recent Labs  Lab 04/01/19 0306  NA 136  K 4.0  CL 100  CO2 22  GLUCOSE 347*  BUN 33*  CREATININE 1.36*  CALCIUM 8.9  MG 2.0   GFR: Estimated Creatinine Clearance: 46 mL/min (A) (by C-G  formula based on SCr of 1.36 mg/dL (H)).  Liver Function Tests: Recent Labs  Lab 04/01/19 0306  AST 15  ALT 17  ALKPHOS 71  BILITOT 0.8  PROT 6.5  ALBUMIN 3.2*    HbA1C: Hgb A1c MFr Bld  Date/Time Value Ref Range Status  04/01/2019 03:06 AM 7.9 (H) 4.8 - 5.6 % Final    Comment:    (NOTE) Pre diabetes:          5.7%-6.4% Diabetes:              >6.4% Glycemic control for   <7.0% adults with diabetes     CBG: Recent Labs  Lab 04/01/19 0202 04/01/19 0741  GLUCAP 350* 298*    Scheduled Meds: . aspirin EC  81 mg Oral Daily  . dexamethasone (DECADRON) injection  6 mg Intravenous Q24H  . heparin  5,000 Units Subcutaneous Q8H  . insulin aspart  0-5 Units Subcutaneous QHS  . insulin aspart  0-9 Units Subcutaneous TID WC  . pantoprazole  40 mg Oral Daily  . sodium chloride flush  3 mL Intravenous Q12H   Continuous Infusions: . sodium chloride    . remdesivir 100 mg in NS 100 mL 100 mg (04/01/19 0855)     LOS: 1 day   Cherene Altes, MD [facility] Office  [phone] Pager - Text Page per Amion  If 7PM-7AM, please contact night-coverage per Amion 04/01/2019, 9:52 AM

## 2019-04-01 NOTE — Progress Notes (Signed)
New Admission Note:   Arrival Method:[facility] from [place] Orientation: A & O x4 Telemetry: On, CCMD notified Assessment: Completed Skin: Intact, refer to flowsheet IV: LFA & RFA, SL Pain: 4/10, Tylenol given Tubes: None Safety Measures: Safety Fall Prevention Plan has been discussed  Admission: completed CGV Orientation: Patient has been orientated to the room, unit and staff.   Family: none  Orders to be reviewed and implemented. Will continue to monitor the patient. Call light has been placed within reach and bed alarm has been activated.

## 2019-04-01 NOTE — H&P (Signed)
History and Physical    Lauren Webb STM:[id number] DOB: 06-19-1945 DOA: 03/31/2019  PCP: Ernestene Kiel, MD   Patient coming from: Home   Chief Complaint: Headache, malaise, nausea, diarrhea, cough   HPI: Lauren Webb is a 74 y.o. female with medical history significant for asthma, chronic kidney disease, GERD, hypertension, and hyperlipidemia, now presenting to the emergency department for evaluation of general aches, malaise, nausea, and diarrhea.  Patient reports that she developed some sinus congestion about a week ago, but while that has improved, she has gone on to develop a nonproductive cough, generalized aches and malaise, nausea, and recurrent nonbloody diarrhea.  Patient does not feel particularly short of breath but has decreased exercise tolerance.  She denies abdominal pain and has not been vomiting, but has several episodes of diarrhea daily.  She reports losing roughly 17 pounds over the past week.  She has not noted any fevers.  She has had headaches without change in vision or hearing, and without any focal numbness or weakness.  She denies any chest pain.  [facility] ED Course: Upon arrival to the ED, patient is found to be afebrile, hypertensive with systolic pressure of 892, slightly tachypneic at rest, and with oxygen saturation in the low 90s on room air.  CBC was unremarkable, chemistry panel notable for creatinine of 1.30 and glucose of 219.  Troponin was undetectable, lactic acid normal, D-dimer 1573, and CRP 843.  Procalcitonin was 0.09. She had ABG with PaO2 of 57.  Urinalysis with 2+ protein and 1+ occult blood.  She underwent CTA chest that was negative for PE but concerning for patchy groundglass densities in the bilateral periphery.  CT of the abdomen and pelvis was negative for acute findings.  Patient was treated with Decadron, remdesivir, 500 cc normal saline, and 40 mg subcutaneous Lovenox in the ED.  She was transferred to [facility] for ongoing  evaluation and management.   Review of Systems:  All other systems reviewed and apart from HPI, are negative.  Past Medical History:  Diagnosis Date  . Allergic rhinitis   . Diabetes ([place])   . GERD (gastroesophageal reflux disease)   . High blood pressure   . High cholesterol   . Mild intermittent asthma, uncomplicated 03/03/4172    Past Surgical History:  Procedure Laterality Date  . [place]  . KIDNEY STONE SURGERY  2011  . TUBAL LIGATION  1975     reports that she has never smoked. She has never used smokeless tobacco. She reports that she does not drink alcohol or use drugs.  Allergies  Allergen Reactions  . Avelox [Moxifloxacin Hcl In Nacl] Other (See Comments)    weakness  . Moxifloxacin Other (See Comments)    weakness  . Nsaids     "Due to Kidney"  . Other Other (See Comments)    "Due to Kidney" "Due to Kidney"    Family History  Problem Relation Age of Onset  . Bone cancer Mother   . Cancer Brother   . Cancer Paternal Uncle   . Heart disease Maternal Grandmother   . Cancer Maternal Grandfather      Prior to Admission medications   Medication Sig Start Date End Date Taking? Authorizing Provider  allopurinol (ZYLOPRIM) 100 MG tablet Take 200 mg by mouth daily.    [provider]  allopurinol (ZYLOPRIM) 300 MG tablet Take 300 mg by mouth daily. 11/30/18   [provider]  ALPRAZolam Duanne Moron) 0.25 MG tablet  Take 0.25 mg by mouth 2 (two) times daily as needed. 11/25/18   [provider]  aspirin EC 81 MG tablet Take 81 mg by mouth daily.    [provider]  azelastine (ASTELIN) 0.1 % nasal spray Use two sprays in each nostril twice daily as needed for runny nose. 03/27/19   Garnet Sierras, DO  B-D UF III MINI PEN NEEDLES 31G X 5 MM MISC USE WITH INSULIN TWICE A DAY 10/28/18   [provider]  cetirizine (ZYRTEC ALLERGY) 10 MG tablet Take 1 tablet (10 mg total) by mouth daily. 03/27/19   Garnet Sierras, DO    Cyanocobalamin (VITAMIN B-12 PO) Take 1 tablet by mouth daily.    [provider]  fluticasone (FLONASE) 50 MCG/ACT nasal spray Can use one spray in each nostril twice daily if needed. 05/06/18   Kennith Gain, MD  glipiZIDE (GLUCOTROL) 10 MG tablet Take 20 mg by mouth 2 (two) times daily before a meal.    [provider]  HUMULIN 70/30 KWIKPEN (70-30) 100 UNIT/ML PEN  02/17/19   [provider]  liraglutide (VICTOZA) 18 MG/3ML SOPN Inject into the skin as needed.    [provider]  lisinopril (PRINIVIL,ZESTRIL) 20 MG tablet Take 20 mg by mouth daily.    [provider]  montelukast (SINGULAIR) 10 MG tablet TAKE 1 TABLET BY MOUTH EVERYDAY AT BEDTIME 01/14/18   Ambs, Kathrine Cords, FNP  omeprazole (PRILOSEC) 40 MG capsule Take 40 mg by mouth daily.    [provider]  ONE TOUCH ULTRA TEST test strip USE AS DIRECTED 4 TIMES A DAY 01/10/17   [provider]  oxymetazoline (AFRIN) 0.05 % nasal spray Place 1 spray into both nostrils 2 (two) times daily. For 3-5 days 08/02/18   Garnet Sierras, DO  pravastatin (PRAVACHOL) 40 MG tablet Take 40 mg by mouth daily.    [provider]  PRESCRIPTION MEDICATION Insulin pen- 15 units daily    [provider]  rosuvastatin (CRESTOR) 40 MG tablet Take 20 mg by mouth daily.     [provider]  torsemide (DEMADEX) 10 MG tablet Take 10 mg by mouth daily. 12/10/18   [provider]    Physical Exam: Vitals:   03/31/19 2344 04/01/19 0121 04/01/19 0122  BP: 103/76 103/76   Pulse: 68 68   Resp: 20 20   Temp: 98.2 F (36.8 C) 98.2 F (36.8 C)   TempSrc: Oral Oral   SpO2: 90%    Weight:  121.9 kg   Height:   5\' 4"  (1.626 m)    Constitutional: NAD, calm  Eyes: PERTLA, lids and conjunctivae normal ENMT: Mucous membranes are moist. Posterior pharynx clear of any exudate or lesions.   Neck: normal, supple, no masses, no thyromegaly Respiratory: no wheezing, no  crackles. No accessory muscle use.  Cardiovascular: S1 & S2 heard, regular rate and rhythm. No extremity edema.  Abdomen: No distension, no tenderness, soft. Bowel sounds active.  Musculoskeletal: no clubbing / cyanosis. No joint deformity upper and lower extremities.   Skin: no significant rashes, lesions, ulcers. Warm, dry, well-perfused. Neurologic: No gross facial asymmetry. Sensation intact. Moving all extremities.  Psychiatric: Alert and oriented x 3. Pleasant and cooperative.    Labs and Imaging on Admission: I have personally reviewed following labs and imaging studies  CBC: No results for input(s): WBC, NEUTROABS, HGB, HCT, MCV, PLT in the last 168 hours. Basic Metabolic Panel: No results for input(s):  NA, K, CL, CO2, GLUCOSE, BUN, CREATININE, CALCIUM, MG, PHOS in the last 168 hours. GFR: CrCl cannot be calculated (No successful lab value found.). Liver Function Tests: No results for input(s): AST, ALT, ALKPHOS, BILITOT, PROT, ALBUMIN in the last 168 hours. No results for input(s): LIPASE, AMYLASE in the last 168 hours. No results for input(s): AMMONIA in the last 168 hours. Coagulation Profile: No results for input(s): INR, PROTIME in the last 168 hours. Cardiac Enzymes: No results for input(s): CKTOTAL, CKMB, CKMBINDEX, TROPONINI in the last 168 hours. BNP (last 3 results) No results for input(s): PROBNP in the last 8760 hours. HbA1C: No results for input(s): HGBA1C in the last 72 hours. CBG: Recent Labs  Lab 04/01/19 0202  GLUCAP 350*   Lipid Profile: No results for input(s): CHOL, HDL, LDLCALC, TRIG, CHOLHDL, LDLDIRECT in the last 72 hours. Thyroid Function Tests: No results for input(s): TSH, T4TOTAL, FREET4, T3FREE, THYROIDAB in the last 72 hours. Anemia Panel: No results for input(s): VITAMINB12, FOLATE, FERRITIN, TIBC, IRON, RETICCTPCT in the last 72 hours. Urine analysis: No results found for: COLORURINE, APPEARANCEUR, LABSPEC, PHURINE, GLUCOSEU, HGBUR,  BILIRUBINUR, KETONESUR, PROTEINUR, UROBILINOGEN, NITRITE, LEUKOCYTESUR Sepsis Labs: @LABRCNTIP (procalcitonin:4,lacticidven:4) )No results found for this or any previous visit (from the past 240 hour(s)).   Radiological Exams on Admission: No results found.  EKG: Independently reviewed. Sinus rhythm, 1st degree AV block.   Assessment/Plan   1. COVID-19 pneumonia  - Presents with several days of cough, aches, malaise, and diarrhea, is found to have COVID with evidence for pulmonary involvement on CXR and hypoxia on ABG  - She was started on remdesivir and Decadron in ED  - Continue remdesivir, Decadron, and as-needed supplemental O2, trend markers   2. Diarrhea  - Presents with several days of watery diarrhea, abdominal exam benign and no acute findings on CT abd/pelvis  - Check C diff and GI pathogen panel  - Monitor electrolytes    3. CKD IV  - SCr was 1.30 in ED, no recent labs available, has "CKD IV" documented   - Renally-dose medications, monitor    4. Type II DM  - Check CBG's and use SSI with Novolog    5. Asthma  - No wheezing on admission  - Hold Sngulair initially in light of diarrhea, continue as-needed albuterol    6. Hypertension  - SBP was 200 in ED, only 100 on arrival to [facility]  - Hold lisinopril and torsemide initially     DVT prophylaxis: sq heparin  Code Status: Full  Family Communication: Discussed with patient  Disposition Plan: She comes from home where she lives alone and may, is generally weak and debilitated from the current illness, and may require home health services or discharge to a nursing facility once respiratory status and diarrhea improved.  Consults called: none  Admission status: Inpatient     Vianne Bulls, MD [facility] Pager: See [email or web address]  If 7AM-7PM, please contact the daytime attending [email or web address]  04/01/2019, 2:19 AM

## 2019-04-01 NOTE — Plan of Care (Signed)
  Problem: Education: Goal: Knowledge of General Education information will improve Description: Including pain rating scale, medication(s)/side effects and non-pharmacologic comfort measures Outcome: Progressing   Problem: Activity: Goal: Risk for activity intolerance will decrease Outcome: Progressing   Problem: Coping: Goal: Level of anxiety will decrease Outcome: Progressing   

## 2019-04-02 LAB — CBC WITH DIFFERENTIAL/PLATELET
Abs Immature Granulocytes: 0.04 10*3/uL (ref 0.00–0.07)
Basophils Absolute: 0 10*3/uL (ref 0.0–0.1)
Basophils Relative: 0 %
Eosinophils Absolute: 0 10*3/uL (ref 0.0–0.5)
Eosinophils Relative: 0 %
HCT: 42.9 % (ref 36.0–46.0)
Hemoglobin: 14 g/dL (ref 12.0–15.0)
Immature Granulocytes: 1 %
Lymphocytes Relative: 16 %
Lymphs Abs: 1.1 10*3/uL (ref 0.7–4.0)
MCH: 31.5 pg (ref 26.0–34.0)
MCHC: 32.6 g/dL (ref 30.0–36.0)
MCV: 96.4 fL (ref 80.0–100.0)
Monocytes Absolute: 0.5 10*3/uL (ref 0.1–1.0)
Monocytes Relative: 8 %
Neutro Abs: 5 10*3/uL (ref 1.7–7.7)
Neutrophils Relative %: 75 %
Platelets: 160 10*3/uL (ref 150–400)
RBC: 4.45 MIL/uL (ref 3.87–5.11)
RDW: 13.8 % (ref 11.5–15.5)
WBC: 6.7 10*3/uL (ref 4.0–10.5)
nRBC: 0 % (ref 0.0–0.2)

## 2019-04-02 LAB — C-REACTIVE PROTEIN: CRP: 10.4 mg/dL — ABNORMAL HIGH (ref <1.0)

## 2019-04-02 LAB — GLUCOSE, CAPILLARY
Glucose-Capillary: 424 mg/dL — ABNORMAL HIGH (ref 70–99)
Glucose-Capillary: 392 mg/dL — ABNORMAL HIGH (ref 70–99)

## 2019-04-02 LAB — COMPREHENSIVE METABOLIC PANEL
ALT: 15 U/L (ref 0–44)
AST: 17 U/L (ref 15–41)
Albumin: 2.9 g/dL — ABNORMAL LOW (ref 3.5–5.0)
Alkaline Phosphatase: 74 U/L (ref 38–126)
Anion gap: 12 (ref 5–15)
BUN: 48 mg/dL — ABNORMAL HIGH (ref 8–23)
CO2: 22 mmol/L (ref 22–32)
Calcium: 9.3 mg/dL (ref 8.9–10.3)
Chloride: 103 mmol/L (ref 98–111)
Creatinine, Ser: 1.66 mg/dL — ABNORMAL HIGH (ref 0.44–1.00)
GFR calc Af Amer: 35 mL/min — ABNORMAL LOW (ref >60)
GFR calc non Af Amer: 30 mL/min — ABNORMAL LOW (ref >60)
Glucose, Bld: 408 mg/dL — ABNORMAL HIGH (ref 70–99)
Potassium: 4 mmol/L (ref 3.5–5.1)
Sodium: 137 mmol/L (ref 135–145)
Total Bilirubin: 0.5 mg/dL (ref 0.3–1.2)
Total Protein: 6.2 g/dL — ABNORMAL LOW (ref 6.5–8.1)

## 2019-04-02 LAB — D-DIMER, QUANTITATIVE: D-Dimer, Quant: 1.75 ug/mL-FEU — ABNORMAL HIGH (ref 0.00–0.50)

## 2019-04-02 LAB — FERRITIN: Ferritin: 227 ng/mL (ref 11–307)

## 2019-04-02 MED ORDER — LACTATED RINGERS IV SOLN: INTRAVENOUS | Status: DC

## 2019-04-02 MED ORDER — ROSUVASTATIN CALCIUM 20 MG PO TABS
20.0000 mg | ORAL_TABLET | Freq: Every day | ORAL | Status: DC
  Administered 2019-04-02 – 2019-04-06 (×4): 20 mg via ORAL
  Filled 2019-04-02 (×5): qty 1

## 2019-04-02 MED ORDER — GUAIFENESIN-DM 100-10 MG/5ML PO SYRP
5.0000 mL | ORAL_SOLUTION | ORAL | Status: DC | PRN
  Administered 2019-04-02 – 2019-04-06 (×7): 5 mL via ORAL
  Filled 2019-04-02 (×8): qty 10

## 2019-04-02 MED ORDER — INSULIN GLARGINE 100 UNIT/ML ~~LOC~~ SOLN
20.0000 [IU] | Freq: Two times a day (BID) | SUBCUTANEOUS | Status: DC
  Administered 2019-04-02 (×2): 20 [IU] via SUBCUTANEOUS
  Filled 2019-04-02 (×3): qty 0.2

## 2019-04-02 MED ORDER — LORATADINE 10 MG PO TABS
10.0000 mg | ORAL_TABLET | Freq: Every day | ORAL | Status: DC
  Administered 2019-04-02 – 2019-04-07 (×6): 10 mg via ORAL
  Filled 2019-04-02 (×6): qty 1

## 2019-04-02 MED ORDER — BENZONATATE 100 MG PO CAPS
100.0000 mg | ORAL_CAPSULE | Freq: Three times a day (TID) | ORAL | Status: DC | PRN
  Administered 2019-04-06: 19:00:00 100 mg via ORAL
  Filled 2019-04-02: qty 1

## 2019-04-02 NOTE — Evaluation (Addendum)
Physical Therapy Evaluation Patient Details Name: Lauren Webb MRN: [id number] DOB: 1945-05-30 Today's Date: 04/02/2019   History of Present Illness  74 year old female admitted 03/31/19 with progressively worse aches, malaise, nausea, diarrhea over 1 week. She noted decreased exercise tolerance.  In the ED, oxygen saturation in the low 90s on room air.  CTA chest: no evidence of PE but did note patchy groundglass densities bilaterally. PMH: asthma, CKD, GERD, DM 2, HTN, and HLD   Clinical Impression  Patient presents with impaired overall balance and strength with functional mobility. She presents with decreased activity tolerance. Patient is below her PLOF of independent with mobility. Oxygen saturation stable on room air during session. Anticipate patient will be able to progress her mobility with continued skilled PT services in the hospital setting in order to safely discharge home with home PT and intermittent supervision/assist for IADLs initially.     04/02/19 0850  Oxygen Therapy  SpO2 96 %  O2 Device Room Air  Patient Activity (if Appropriate) In chair  Pulse Oximetry Type Continuous     04/02/19 0910  Vitals  Pulse Rate (!) 117  Pulse Rate Source Monitor  Oxygen Therapy  SpO2 94 %  O2 Device Room Air  Patient Activity (if Appropriate) Ambulating  Pulse Oximetry Type Continuous      Follow Up Recommendations Home health PT;Supervision - Intermittent((assist for IADLs))    Equipment Recommendations  (recommend use of her 4WW,recommend phone on her at all times)       Precautions / Restrictions Precautions Precautions: Fall Restrictions Weight Bearing Restrictions: No      Mobility  Bed Mobility Overal bed mobility: Needs Assistance Bed Mobility: Sit to Supine       Sit to supine: Supervision   General bed mobility comments: supervision for cues on improved positioning in the bed  Transfers Overall transfer level: Needs assistance Equipment used:  None;4-wheeled walker Transfers: Sit to/from Stand Sit to Stand: Min assist;Supervision;Modified independent (Device/Increase time)         General transfer comment: sit<>stand from recliner chair first trial without AD with minA, sit>stand from recliner chair with 4WW with contact guard and cues/assist for brake mgmt, toilet transfer supervision progressing to modI with 4WW and GB. Stand>sit on EOB with supervision.  Ambulation/Gait Ambulation/Gait assistance: Supervision   Assistive device: 4-wheeled walker Gait Pattern/deviations: Step-through pattern(increased lateral sway) Gait velocity: decreased   General Gait Details: Marching in place without AD with minA so use of 4WW during gait trial. Increased lateral sway noted. Supervision for safety in smaller spaces for education to use 4WW for all mobility and to decrease time spent walking backwards to increase safety.(O2 sat stable on room air, HR up to 117 bpm with ambulation)  Stairs Patient denies having any stairs at home    Balance Overall balance assessment: Needs assistance Sitting-balance support: Feet supported Sitting balance-Leahy Scale: Good Sitting balance - Comments: dynamic sitting balance on toilet for patient to complete her own pericare   Standing balance support: No upper extremity supported Standing balance-Leahy Scale: Fair Standing balance comment: Unsteady static and dynamic standing balance without UE support so use of 4WW during mobility.       Pertinent Vitals/Pain Pain Location: back and head    Home Living Family/patient expects to be discharged to:: Private residence Living Arrangements: Alone Available Help at Discharge: Friend(s)((she reports she has a friend to help if needed)) Type of Home: House Home Access: Level entry     Home Layout: One  level Home Equipment: Grab bars - tub/shower;Walker - 4 wheels Additional Comments: Independent PTA no device. Has a 4WW at home. Uses electric  cart at some grocery stores, can ambulate around others. Does her own cooking, cleaning, driving, groceries, ADLs. Fall when attempting to sit on foot of recliner a few weeks ago. Nephew assisted her up.     Prior Function Level of Independence: Independent    Comments: independent without device         Cognition Arousal/Alertness: Awake/alert Behavior During Therapy: WFL for tasks assessed/performed             Assessment/Plan    PT Assessment Patient needs continued PT services  PT Problem List Decreased strength;Decreased activity tolerance;Decreased balance;Decreased mobility;Decreased knowledge of use of DME       PT Treatment Interventions DME instruction;Gait training;Functional mobility training;Therapeutic activities;Therapeutic exercise;Balance training;Patient/family education    PT Goals (Current goals can be found in the Care Plan section)  Acute Rehab PT Goals Patient Stated Goal: patient worried about discharging home with upcoming bad weather as she lives 60 miles away PT Goal Formulation: With patient Time For Goal Achievement: 04/15/19 Potential to Achieve Goals: Good    Frequency Min 3X/week   Barriers to discharge Other (comment)(patient lives alone)         AM-PAC PT "6 Clicks" Mobility  Outcome Measure Help needed turning from your back to your side while in a flat bed without using bedrails?: None Help needed moving from lying on your back to sitting on the side of a flat bed without using bedrails?: None Help needed moving to and from a bed to a chair (including a wheelchair)?: A Little Help needed standing up from a chair using your arms (e.g., wheelchair or bedside chair)?: A Little Help needed to walk in hospital room?: A Little Help needed climbing 3-5 steps with a railing? : A Little 6 Click Score: 20    End of Session   Activity Tolerance: Patient limited by fatigue Patient left: in bed;with call bell/phone within reach(verbalized  understanding to ring for assist with mobility) Nurse Communication: Other (comment)(Patient request for Tylenol. Patient status back in bed.) PT Visit Diagnosis: Unsteadiness on feet (R26.81);Other abnormalities of gait and mobility (R26.89);Difficulty in walking, not elsewhere classified (R26.2)    Time: 1610-9604  Charges:  1 Visit Moderate Evaluation            Birdie Hopes, DPT, PT Acute Rehab [phone]   Birdie Hopes 04/02/2019, 2:02 PM

## 2019-04-02 NOTE — Progress Notes (Signed)
Occupational Therapy Evaluation  Clinical Impression:  PTA pt lived alone, modified independent in ADLs, mobility, and IADLs. Pt reports that she could receive assistance from friends and brother upon d/c if needed. Pt does ambulate with an assistive device (rollator) and reports 0 falls in the last 6 months. Pt currently requires setup to min assist for self-care and functional transfers with Contact Guard Assistance. Pt able to ambulate to/from bathroom with RW and min guard. Noted 0 instances of loss of balance, however pt unsteady on feet. Pt required Minimal cues for safety with RW. Pt engaged in additional self-care tasks while seated EOB. Pt on room air with SpO2 maintaining in 90s throughout. No reports of shortness of breath. Pt required increased time and frequent rest breaks due to fatigue and lower pain. RN updated. Pt demonstrates decreased strength, endurance, balance, standing tolerance, activity tolerance, and safety awareness impacting ability to complete self-care and functional transfer tasks. Recommend skilled Acute OT services to address above deficits in order to promote function and prevent further decline. Recommend HHOT upon discharge.   04/02/19 1500  OT Visit Information  Assistance Needed +1  History of Present Illness 74 year old female admitted 03/31/19 with progressively worse aches, malaise, nausea, diarrhea over 1 week. She noted decreased exercise tolerance.  In the ED, oxygen saturation in the low 90s on room air.  CTA chest: no evidence of PE but did note patchy groundglass densities bilaterally. PMH: asthma, CKD, GERD, DM 2, HTN, and HLD   Precautions  Precautions Fall  Restrictions  Weight Bearing Restrictions No  Home Living  Family/patient expects to be discharged to: Private residence  Living Arrangements Alone  Available Help at Discharge Friend(s)  Type of [place] Access Level entry  [place] One level  Bathroom Shower/Tub Walk-in  shower;Tub/shower unit  [place] bars - tub/shower;Walker - 4 wheels  Additional Comments Independent PTA no device. Has a 4WW at home. Uses electric cart at some grocery stores, can ambulate around others. Does her own cooking, cleaning, driving, groceries, ADLs. Fall when attempting to sit on foot of recliner a few weeks ago. Nephew assisted her up.   Prior Function  Level of Independence Independent with assistive device(s)  Communication  Communication No difficulties  Pain Assessment  Pain Assessment No/denies pain  Pain Score 5  Pain Location back and head  Cognition  Arousal/Alertness Awake/alert  Behavior During Therapy Impulsive  Overall Cognitive Status Within Functional Limits for tasks assessed  Upper Extremity Assessment  Upper Extremity Assessment Generalized weakness;LUE deficits/detail;RUE deficits/detail  RUE  (Grossly 3+/5, AROM WFL for all planes )  LUE  (Grossly 3+/5, AROM WFL for all planes )  Lower Extremity Assessment  Lower Extremity Assessment Defer to PT evaluation  ADL  Overall ADL's  Needs assistance/impaired  Eating/Feeding Independent  Grooming Set up  Upper Body Bathing Set up  Lower Body Bathing Min guard  Upper Body Dressing  Set up  Lower Body Dressing Min guard  Toilet Transfer Min guard  Toileting- Clothing Manipulation and Hygiene Min guard  Functional mobility during ADLs Min guard;Rolling walker  Vision- History  Baseline Vision/History Wears glasses  Wears Glasses At all times  Bed Mobility  Overal bed mobility Modified Independent  Bed Mobility Sit to Supine  Sit to supine Modified independent (Device/Increase time)  Transfers  Overall transfer level Needs assistance  Equipment used Rolling walker (2 wheeled)  Transfers Sit to/from [employer]  to Stand Min guard  Stand pivot transfers Min guard  Balance  Overall balance assessment Needs assistance   Sitting-balance support Feet supported  Sitting balance-Leahy Scale Good  Standing balance support Bilateral upper extremity supported  Standing balance-Leahy Scale Fair  Exercises  Exercises General Upper Extremity  General Exercises - Upper Extremity  Shoulder Flexion AROM;5 reps  Shoulder Extension AROM;5 reps  Shoulder ABduction AROM;5 reps  Shoulder ADduction AROM;5 reps  Shoulder Horizontal ABduction AROM;5 reps  Shoulder Horizontal ADduction AROM;5 reps  Elbow Flexion AROM;5 reps  Elbow Extension AROM  OT - End of Session  Equipment Utilized During Treatment Rolling walker  Activity Tolerance Patient tolerated treatment well  Patient left in bed;with call bell/phone within reach  Nurse Communication Mobility status  OT Assessment  OT Recommendation/Assessment Patient needs continued OT Services  OT Visit Diagnosis Unsteadiness on feet (R26.81);Muscle weakness (generalized) (M62.81)  OT Problem List Decreased strength;Decreased activity tolerance;Impaired balance (sitting and/or standing);Decreased safety awareness;Decreased knowledge of use of DME or AE;Cardiopulmonary status limiting activity;Obesity  OT Plan  OT Frequency (ACUTE ONLY) Min 3X/week  OT Treatment/Interventions (ACUTE ONLY) Self-care/ADL training;Therapeutic exercise;Energy conservation;Therapeutic activities;Balance training;Patient/family education  AM-PAC OT "6 Clicks" Daily Activity Outcome Measure (Version 2)  Help from another person eating meals? 4  Help from another person taking care of personal grooming? 3  Help from another person toileting, which includes using toliet, bedpan, or urinal? 3  Help from another person bathing (including washing, rinsing, drying)? 3  Help from another person to put on and taking off regular upper body clothing? 3  Help from another person to put on and taking off regular lower body clothing? 3  6 Click Score 19  OT Recommendation  Follow Up Recommendations Home  health OT  OT Equipment 3 in 1 bedside commode;Tub/shower bench  Acute Rehab OT Goals  Patient Stated Goal to return home   OT Goal Formulation With patient  Time For Goal Achievement 04/16/19  Potential to Achieve Goals Good  OT Time Calculation  OT Start Time (ACUTE ONLY) 1420  OT Stop Time (ACUTE ONLY) 1505  OT Time Calculation (min) 45 min  OT General Charges  $OT Visit 1 Visit  OT Evaluation  $OT Eval Moderate Complexity 1 Mod  OT Treatments  $Self Care/Home Management  8-22 mins  $Therapeutic Exercise 8-22 mins  Written Expression  Dominant Hand Right   Maclean Foister OTR/L

## 2019-04-02 NOTE — Plan of Care (Signed)

## 2019-04-02 NOTE — Progress Notes (Addendum)
PROGRESS NOTE  Lauren Webb  QQI:[id number] DOB: November 05, 1945 DOA: 03/31/2019 PCP: Ernestene Kiel, MD   Brief Narrative: Lauren Webb is a 74 y.o. female with a history of asthma, morbid obesity, T2DM, HTN, HLD, GERD, and CKD stage IV who presented to [facility] on 2/15 with nonproductive cough, generalized myalgias and decreased exercise tolerance associated with poor appetite. In the ED she was afebrile, hypertensive, tachypneic with borderline oxygen saturations at rest. CBC unremarkable, CMP w/Cr 1.30, glucose 219, troponin undetectable, CRP 843 (ULN 10), d-dimer 1573 (ULN 500), PCT 0.09. ABG confirmed hypoxia with PaO2 57. CTA chest showed no PE, but did reveal patchy GGOs peripherally bilaterally. Remdesivir, decadron, and normal saline given in the ED, and the patient was admitted to [facility].  Assessment & Plan: Principal Problem:   COVID-19 virus infection Active Problems:   Essential hypertension   Mild intermittent asthma, uncomplicated   Diabetes ([place])   Diarrhea   CKD (chronic kidney disease), stage IV (HCC)  Acute hypoxemic respiratory failure due to covid-19 pneumonia: SARS-CoV-2 positive in RH on 2/15.  - Continue remdesivir x5 days 2/15 - 2/19. Day 3 today.  - Steroids x10 days, will taper as quickly as prudent due to hyperglycemia. CRP 13.9 > 10.4.  - Vitamin C, zinc - Encourage OOB, IS, FV, and awake proning if able - Tylenol and antitussives prn - Continue airborne, contact precautions for 21 days from positive testing. - Check CBC w/diff, CMP, CRP daily  Asthma, allergic rhinitis:  - Continue prn albuterol, astelin, po antihistamine, add antitussives, will restart singulair   AKI vs. stage IV CKD: Unclear. Will clarify diagnosis based on creatinine trends during hospitalization.   - Will give 1L LR over the course of the day due to osmotic diuresis.  - Monitor BMP, avoid nephrotoxins - Holding lisinopril and torsemide for now.   HTN: Better control since  admission - Holding lisinopril and torsemide for now. Will give hydralazine prn  IDT2DM with steroid-induced hyperglycemia:  - Augment lantus to 20u BID and continue resistant SSI (increased last night) with anticipation of need to augment further through the day.  - Linagliptin added - DC steroids as soon as hypoxia has resolved.   Morbid obesity: Estimated body mass index is 46.13 kg/m as calculated from the following:   Height as of this encounter: 5\' 4"  (1.626 m).   Weight as of this encounter: 121.9 kg.   Microscopic hematuria: 1+ occult blood reported. Hgb wnl. - Recheck at follow up, proceed with work up if remains.   Covid gastroenteritis: Improving. Abd benign. - Monitor clinically  GERD:  - Continue PPI  Anxiety:  - Continue prn alprazolam  HLD:  - Continue statin  DVT prophylaxis: Lovenox 0.5mg /kg q24h (CrCl now >61ml/min) Code Status: Full Family Communication: None at bedside. Did not request a call.  Disposition Plan: Unclear, as she has not returned to PLOF at this time, though hopeful for improvement and return home w/HH. Will monitor with ongoing covid therapy, possible DC 2/19.  Consultants:   None  Procedures:   None  Antimicrobials:  Remdesivir   Subjective: Breathing better, still diffusely very weak, easily fatigued. No chest pain. Shortness of breath with exertion is moderate, constant, improved/resolved with rest. Associated with tachycardia without reported palpitations.   Objective: Vitals:   04/02/19 0500 04/02/19 0738 04/02/19 0850 04/02/19 0910  BP:  135/80    Pulse:  75  (!) 117  Resp: 20     Temp:  TempSrc:      SpO2: 94% 96% 96% 94%  Weight:      Height:        Intake/Output Summary (Last 24 hours) at 04/02/2019 1015 Last data filed at 04/02/2019 2563 Gross per 24 hour  Intake 967.6 ml  Output 750 ml  Net 217.6 ml   Filed Weights   04/01/19 0121  Weight: 121.9 kg    Gen: 74 y.o. female in no distress Pulm:  Tachypneic without accessory muscle use. Crackles bilaterally.  CV: Regular rate and rhythm. No murmur, rub, or gallop. No JVD, no pedal edema. GI: Abdomen soft, non-tender, non-distended, with normoactive bowel sounds. No organomegaly or masses felt. Ext: Warm, no deformities Skin: No rashes, lesions or ulcers on visualized skin. Neuro: Alert and oriented. No focal neurological deficits. Psych: Judgement and insight appear normal. Mood & affect appropriate.   Data Reviewed: I have personally reviewed following labs and imaging studies  CBC: Recent Labs  Lab 04/01/19 0306 04/02/19 0223  WBC 3.3* 6.7  NEUTROABS 2.4 5.0  HGB 13.9 14.0  HCT 43.7 42.9  MCV 97.5 96.4  PLT 135* 893   Basic Metabolic Panel: Recent Labs  Lab 04/01/19 0306 04/02/19 0223  NA 136 137  K 4.0 4.0  CL 100 103  CO2 22 22  GLUCOSE 347* 408*  BUN 33* 48*  CREATININE 1.36* 1.66*  CALCIUM 8.9 9.3  MG 2.0  --    GFR: Estimated Creatinine Clearance: 37.7 mL/min (A) (by C-G formula based on SCr of 1.66 mg/dL (H)). Liver Function Tests: Recent Labs  Lab 04/01/19 0306 04/02/19 0223  AST 15 17  ALT 17 15  ALKPHOS 71 74  BILITOT 0.8 0.5  PROT 6.5 6.2*  ALBUMIN 3.2* 2.9*   No results for input(s): LIPASE, AMYLASE in the last 168 hours. No results for input(s): AMMONIA in the last 168 hours. Coagulation Profile: No results for input(s): INR, PROTIME in the last 168 hours. Cardiac Enzymes: No results for input(s): CKTOTAL, CKMB, CKMBINDEX, TROPONINI in the last 168 hours. BNP (last 3 results) No results for input(s): PROBNP in the last 8760 hours. HbA1C: Recent Labs    04/01/19 0306  HGBA1C 7.9*   CBG: Recent Labs  Lab 04/01/19 1156 04/01/19 1646 04/01/19 2031 04/02/19 0710 04/02/19 0748  GLUCAP 302* 299* 397* 424* 392*   Lipid Profile: No results for input(s): CHOL, HDL, LDLCALC, TRIG, CHOLHDL, LDLDIRECT in the last 72 hours. Thyroid Function Tests: No results for input(s): TSH,  T4TOTAL, FREET4, T3FREE, THYROIDAB in the last 72 hours. Anemia Panel: Recent Labs    04/01/19 0306 04/02/19 0223  FERRITIN 173 227   Urine analysis: No results found for: COLORURINE, APPEARANCEUR, LABSPEC, PHURINE, GLUCOSEU, HGBUR, BILIRUBINUR, KETONESUR, PROTEINUR, UROBILINOGEN, NITRITE, LEUKOCYTESUR No results found for this or any previous visit (from the past 240 hour(s)).    Radiology Studies: No results found.  Scheduled Meds: . aspirin EC  81 mg Oral Daily  . dexamethasone (DECADRON) injection  6 mg Intravenous Q24H  . enoxaparin (LOVENOX) injection  60 mg Subcutaneous Q24H  . insulin aspart  0-20 Units Subcutaneous TID WC  . insulin aspart  0-5 Units Subcutaneous QHS  . insulin glargine  20 Units Subcutaneous BID  . linagliptin  5 mg Oral Daily  . montelukast  10 mg Oral QHS  . pantoprazole  40 mg Oral Daily  . sodium chloride flush  3 mL Intravenous Q12H  . vitamin B-12  1,000 mcg Oral QHS   Continuous Infusions: .  sodium chloride    . lactated ringers    . remdesivir 100 mg in NS 100 mL 100 mg (04/02/19 0944)     LOS: 2 days   Time spent: 35 minutes.  Patrecia Pour, MD [facility] [email or web address] 04/02/2019, 10:15 AM

## 2019-04-03 LAB — CBC WITH DIFFERENTIAL/PLATELET
Abs Immature Granulocytes: 0.04 10*3/uL (ref 0.00–0.07)
Basophils Absolute: 0 10*3/uL (ref 0.0–0.1)
Basophils Relative: 0 %
Eosinophils Absolute: 0 10*3/uL (ref 0.0–0.5)
Eosinophils Relative: 0 %
HCT: 41.1 % (ref 36.0–46.0)
Hemoglobin: 13.4 g/dL (ref 12.0–15.0)
Immature Granulocytes: 1 %
Lymphocytes Relative: 15 %
Lymphs Abs: 0.9 10*3/uL (ref 0.7–4.0)
MCH: 31.8 pg (ref 26.0–34.0)
MCHC: 32.6 g/dL (ref 30.0–36.0)
MCV: 97.4 fL (ref 80.0–100.0)
Monocytes Absolute: 0.3 10*3/uL (ref 0.1–1.0)
Monocytes Relative: 5 %
Neutro Abs: 4.8 10*3/uL (ref 1.7–7.7)
Neutrophils Relative %: 79 %
Platelets: 146 10*3/uL — ABNORMAL LOW (ref 150–400)
RBC: 4.22 MIL/uL (ref 3.87–5.11)
RDW: 13.9 % (ref 11.5–15.5)
WBC: 6 10*3/uL (ref 4.0–10.5)
nRBC: 0 % (ref 0.0–0.2)

## 2019-04-03 LAB — COMPREHENSIVE METABOLIC PANEL
ALT: 15 U/L (ref 0–44)
AST: 13 U/L — ABNORMAL LOW (ref 15–41)
Albumin: 2.7 g/dL — ABNORMAL LOW (ref 3.5–5.0)
Alkaline Phosphatase: 67 U/L (ref 38–126)
Anion gap: 11 (ref 5–15)
BUN: 40 mg/dL — ABNORMAL HIGH (ref 8–23)
CO2: 20 mmol/L — ABNORMAL LOW (ref 22–32)
Calcium: 8.9 mg/dL (ref 8.9–10.3)
Chloride: 104 mmol/L (ref 98–111)
Creatinine, Ser: 1.43 mg/dL — ABNORMAL HIGH (ref 0.44–1.00)
GFR calc Af Amer: 41 mL/min — ABNORMAL LOW (ref >60)
GFR calc non Af Amer: 36 mL/min — ABNORMAL LOW (ref >60)
Glucose, Bld: 373 mg/dL — ABNORMAL HIGH (ref 70–99)
Potassium: 4.5 mmol/L (ref 3.5–5.1)
Sodium: 135 mmol/L (ref 135–145)
Total Bilirubin: 0.5 mg/dL (ref 0.3–1.2)
Total Protein: 5.9 g/dL — ABNORMAL LOW (ref 6.5–8.1)

## 2019-04-03 LAB — D-DIMER, QUANTITATIVE: D-Dimer, Quant: 1.41 ug/mL-FEU — ABNORMAL HIGH (ref 0.00–0.50)

## 2019-04-03 LAB — GLUCOSE, CAPILLARY
Glucose-Capillary: 330 mg/dL — ABNORMAL HIGH (ref 70–99)
Glucose-Capillary: 287 mg/dL — ABNORMAL HIGH (ref 70–99)

## 2019-04-03 LAB — C-REACTIVE PROTEIN: CRP: 9.4 mg/dL — ABNORMAL HIGH (ref <1.0)

## 2019-04-03 MED ORDER — INSULIN GLARGINE 100 UNIT/ML ~~LOC~~ SOLN
30.0000 [IU] | Freq: Two times a day (BID) | SUBCUTANEOUS | Status: DC
  Administered 2019-04-03 (×2): 30 [IU] via SUBCUTANEOUS
  Filled 2019-04-03 (×3): qty 0.3

## 2019-04-03 MED ORDER — INSULIN ASPART 100 UNIT/ML ~~LOC~~ SOLN
8.0000 [IU] | Freq: Three times a day (TID) | SUBCUTANEOUS | Status: DC
  Administered 2019-04-03 (×2): 8 [IU] via SUBCUTANEOUS

## 2019-04-03 NOTE — Progress Notes (Signed)
Occupational Therapy Note  Clinical Impression: Patient has demonstrated steady progress with ADL SKILL LEVEL with overall level of assist at Modified Independence to Supervision with rest breaks secondary to decreased activity tolerance. Patient was able to mobilize to bathroom on room air for toileting task with stats in the 90s. Patient able to safely navigate rolling walker in room and hallway with Supervision and no LOB episodes for about 150 feet. Patient was able to complete grooming while standing at sink with Modified Independence and proper incorporation of PLB techniques. Patient was able to perform B UE muscle strengthening exercises along with handout at Supervision level. Patient will benefit from continued skilled OT acute services.    04/03/19 1600  OT Visit Information  Assistance Needed +1  History of Present Illness 74 year old female admitted 03/31/19 with progressively worse aches, malaise, nausea, diarrhea over 1 week. She noted decreased exercise tolerance.  In the ED, oxygen saturation in the low 90s on room air.  CTA chest: no evidence of PE but did note patchy groundglass densities bilaterally. PMH: asthma, CKD, GERD, DM 2, HTN, and HLD   Precautions  Precautions Fall  Pain Assessment  Pain Assessment No/denies pain  Cognition  Arousal/Alertness Awake/alert  Behavior During Therapy WFL for tasks assessed/performed  Overall Cognitive Status Within Functional Limits for tasks assessed  ADL  Toilet Transfer Supervision/safety  Toileting- Clothing Manipulation and Hygiene Supervision/safety  Functional mobility during ADLs Supervision/safety;Rolling walker  Bed Mobility  Overal bed mobility Modified Independent  Bed Mobility Sit to Supine  Sit to supine Modified independent (Device/Increase time)  General bed mobility comments patient able to use bed rail for supine<>sit  Balance  Overall balance assessment Needs assistance  Sitting balance-Leahy Scale Good   Standing balance support Bilateral upper extremity supported  Standing balance-Leahy Scale Fair  Standing balance comment Improving in balance with use of AD  Restrictions  Weight Bearing Restrictions No  Transfers  Overall transfer level Needs assistance  Equipment used Rolling walker (2 wheeled)  Transfers Sit to/from Stand  Sit to Stand Supervision;Modified independent (Device/Increase time)  Stand pivot transfers Supervision  Exercises  Exercises Other exercises  General Exercises - Upper Extremity  Shoulder Flexion AROM;10 reps  Shoulder Extension AROM  Shoulder ABduction AROM  Shoulder ADduction AROM  Shoulder Horizontal ABduction AROM  Shoulder Horizontal ADduction AROM  Elbow Flexion AROM  Elbow Extension AROM  Other Exercises  Other Exercises flutter valve x 5   Other Exercises incentive spirometer x 10 reps (avg 1036mL)  OT - End of Session  Equipment Utilized During Treatment Rolling walker  Activity Tolerance Patient tolerated treatment well  Patient left in bed;with call bell/phone within reach  Nurse Communication Mobility status  OT Assessment/Plan  Follow Up Recommendations Home health OT (home health aide)  OT Equipment 3 in 1 bedside commode;Tub/shower bench  AM-PAC OT "6 Clicks" Daily Activity Outcome Measure (Version 2)  Help from another person eating meals? 4  Help from another person taking care of personal grooming? 4  Help from another person toileting, which includes using toliet, bedpan, or urinal? 3  Help from another person bathing (including washing, rinsing, drying)? 3  Help from another person to put on and taking off regular upper body clothing? 3  Help from another person to put on and taking off regular lower body clothing? 3  6 Click Score 20  OT Goal Progression  Progress towards OT goals Progressing toward goals  OT Time Calculation  OT Start Time (ACUTE ONLY)  1525  OT Stop Time (ACUTE ONLY) 1604  OT Time Calculation (min) 39 min   OT General Charges  $OT Visit 1 Visit  OT Treatments  $Self Care/Home Management  8-22 mins  $Therapeutic Activity 8-22 mins  $Therapeutic Exercise 8-22 mins   Teshia Mahone OTR/L

## 2019-04-03 NOTE — Progress Notes (Signed)
Physical Therapy Treatment Patient Details Name: Lauren Webb MRN: [id number] DOB: Aug 04, 1945 Today's Date: 04/03/2019    History of Present Illness 74 year old female admitted 03/31/19 with progressively worse aches, malaise, nausea, diarrhea over 1 week. She noted decreased exercise tolerance.  In the ED, oxygen saturation in the low 90s on room air.  CTA chest: no evidence of PE but did note patchy groundglass densities bilaterally. PMH: asthma, CKD, GERD, DM 2, HTN, and HLD (possible discharge 2/19 per MD note)    PT Comments    Patient on 2L at start of session most likely due to the fact that she uses Bipap/CPAP at home and she just woke up this morning upon PT arrival. Oxygen saturation stable on room air (>/=89%) during session but overall oxygen saturation lower on room air today compared to yesterday's session. Question if this is due to her just getting up for the morning vs yesterday she had already been OOB in chair prior to PT session. Improved use of 4WW during session today compared to yesterday. Focus on transfer training with safety with brake mgmt and hand placement. Recommend continued skilled PT services and home PT upon discharge as well as assist for IADLs initially. Patient noting she thinks she will need assistance for bathing upon return home. Patient able to name two energy conservation techniques during session. Education provided on more strategies for energy conservation.    Follow Up Recommendations  Home health PT;Supervision - Intermittent(initially assist for IADLs, pt requesting assist for bathing)     Equipment Recommendations  (recommend use of 4WW, recommend phone on her at all times)       Precautions / Restrictions Precautions Precautions: Fall Restrictions Weight Bearing Restrictions: No    Mobility  Bed Mobility  General bed mobility comments: nurse assisting patient supine>sit upon PT arrival  Transfers Overall transfer level: Needs  assistance Equipment used: 4-wheeled walker Transfers: Sit to/from Stand Sit to Stand: Supervision(progressing toward modI). Sit>stand from EOB with cues for brake mgmt and hand placement. Sit<>stand from recliner chair x 2 consecutive trials with less cues required.   General transfer comment: cues for brake mgmt and hand placement, progressing to modI with sit>stand  Ambulation/Gait Ambulation/Gait assistance: Supervision;Modified independent (Device/Increase time) Gait Distance (Feet): 100 Feet Assistive device: 4-wheeled walker Gait Pattern/deviations: Step-through pattern     General Gait Details: lowest O2 saturation noted 89%, grossly >/=90% with ambulation on RA. HR high of 115 bpm      Balance Overall balance assessment: Needs assistance         Standing balance support: Bilateral upper extremity supported Standing balance-Leahy Scale: Fair Standing balance comment: Improved compared to yesterday with use of 4WW.    Cognition Arousal/Alertness: Awake/alert    Exercises Other Exercises Other Exercises: flutter x 3 successful trials Other Exercises: incentive spirometer x 5 reps (max 1250)    General Comments General comments (skin integrity, edema, etc.): Improved safety with use of 4WW today compared to yesterday but patient not challenged as much as yesterday in smaller spaces.      Pertinent Vitals/Pain Pain Assessment: No/denies pain(No complaints of pain. No signs/symptoms of pain.)           PT Goals (current goals can now be found in the care plan section) Progress towards PT goals: Progressing toward goals    Frequency    Min 3X/week      PT Plan Current plan remains appropriate       AM-PAC PT "6 Clicks"  Mobility   Outcome Measure  Help needed turning from your back to your side while in a flat bed without using bedrails?: None Help needed moving from lying on your back to sitting on the side of a flat bed without using bedrails?: A  Little Help needed moving to and from a bed to a chair (including a wheelchair)?: A Little Help needed standing up from a chair using your arms (e.g., wheelchair or bedside chair)?: None Help needed to walk in hospital room?: A Little Help needed climbing 3-5 steps with a railing? : A Little 6 Click Score: 20    End of Session   Activity Tolerance: Patient limited by fatigue Patient left: in chair;with call bell/phone within reach Nurse Communication: Mobility status(oxygen saturation with mobility) PT Visit Diagnosis: Unsteadiness on feet (R26.81);Other abnormalities of gait and mobility (R26.89);Difficulty in walking, not elsewhere classified (R26.2)     Time: 1443-1540 PT Time Calculation (min) (ACUTE ONLY): 32 min  Charges:  $Therapeutic Activity: 23-37 mins                     Birdie Hopes, DPT, PT Acute Rehab [phone]     Birdie Hopes 04/03/2019, 9:14 AM

## 2019-04-03 NOTE — Progress Notes (Signed)
PROGRESS NOTE  Lauren Webb  TUU:[id number] DOB: March 27, 1945 DOA: 03/31/2019 PCP: Ernestene Kiel, MD   Brief Narrative: Lauren Webb is a 74 y.o. female with a history of asthma, morbid obesity, T2DM, HTN, HLD, GERD, and CKD stage IV who presented to [facility] on 2/15 with nonproductive cough, generalized myalgias and decreased exercise tolerance associated with poor appetite. In the ED she was afebrile, hypertensive, tachypneic with borderline oxygen saturations at rest. CBC unremarkable, CMP w/Cr 1.30, glucose 219, troponin undetectable, CRP 843 (ULN 10), d-dimer 1573 (ULN 500), PCT 0.09. ABG confirmed hypoxia with PaO2 57. CTA chest showed no PE, but did reveal patchy GGOs peripherally bilaterally. Remdesivir, decadron, and normal saline given in the ED, and the patient was admitted to [facility]. With continued treatment, hypoxia has improved, inflammatory markers have trended downward, and generalized weakness as begun to improve.   Assessment & Plan: Principal Problem:   COVID-19 virus infection Active Problems:   Essential hypertension   Mild intermittent asthma, uncomplicated   Diabetes ([place])   Diarrhea   CKD (chronic kidney disease), stage IV (HCC)  Acute hypoxemic respiratory failure due to covid-19 pneumonia: SARS-CoV-2 positive in [facility] on 2/15.  - Continue remdesivir x5 days 2/15 - 2/19. Day 4 today.  - Steroids x10 days, will taper as quickly as prudent due to hyperglycemia. CRP 13.9 > 10.4 > 9.4 - Vitamin C, zinc - Encourage OOB, IS, FV, and awake proning if able - Tylenol and antitussives prn - Continue airborne, contact precautions for 21 days from positive testing. - Check CBC w/diff, CMP, CRP daily  Asthma, allergic rhinitis:  - Continue prn albuterol, astelin, po antihistamine, added antitussives, continue singulair   AKI vs. stage IV CKD: Unclear. Will clarify diagnosis based on creatinine trends during hospitalization.   - Gave LR 2/17. Creatinine stable. To avoid  hypervolemia in hypoxia covid patient, will hold IVF for right now.  - Monitor BMP, avoid nephrotoxins - Holding lisinopril and torsemide for now, likely to restart torsemide in next 24-48hrs.   HTN: Better control since admission - Holding lisinopril and torsemide for now. Will give hydralazine prn  IDT2DM with steroid-induced hyperglycemia:  - Augmented lantus to 20u BID, will further increase to 30u BID, add 8u TIDWC, and continue resistant SSI  - Linagliptin added - DC steroids as soon as hypoxia has resolved.   Morbid obesity: Estimated body mass index is 46.13 kg/m as calculated from the following:   Height as of this encounter: 5\' 4"  (1.626 m).   Weight as of this encounter: 121.9 kg.   Microscopic hematuria: 1+ occult blood reported. Hgb wnl. - Recheck at follow up, proceed with work up if remains.   Covid gastroenteritis: Improving. Abd benign. - Monitor clinically  GERD:  - Continue PPI  Anxiety:  - Continue prn alprazolam  HLD:  - Continue statin  DVT prophylaxis: Lovenox 0.5mg /kg q24h (CrCl now >82ml/min) Code Status: Full Family Communication: None at bedside. No call requested. Disposition Plan: Hopeful for improvement improvement in hypoxemia and return home w/HH. Also has family living nearby. Will monitor with ongoing covid therapy, possible DC 2/19.  Consultants:   None  Procedures:   None  Antimicrobials:  Remdesivir   Subjective: Diffuse weakness is moderate-severe, constant. Eating better, still urinating a fair amount. Shortness of breath with exertion without any pain reported.   Objective: Vitals:   04/03/19 0740 04/03/19 0741 04/03/19 0742 04/03/19 0849  BP:      Pulse: 82 79 80 (!) 115  Resp:      Temp:      TempSrc:      SpO2: 92% 93% 94% (!) 89%  Weight:      Height:        Intake/Output Summary (Last 24 hours) at 04/03/2019 1112 Last data filed at 04/03/2019 7915 Gross per 24 hour  Intake 1765.35 ml  Output 300 ml  Net  1465.35 ml   Filed Weights   04/01/19 0121  Weight: 121.9 kg   Gen: 74 y.o. female in no distress Pulm: Nonlabored tachypnea at rest. Crackles noted, stable. CV: Regular rate and rhythm. No murmur, rub, or gallop. No JVD, no dependent edema. GI: Abdomen soft, non-tender, non-distended, with normoactive bowel sounds.  Ext: Warm, no deformities Skin: No rashes, lesions or ulcers on visualized skin. Neuro: Alert and oriented. No focal neurological deficits. Psych: Judgement and insight appear fair. Mood euthymic & affect congruent. Behavior is appropriate.    Data Reviewed: I have personally reviewed following labs and imaging studies  CBC: Recent Labs  Lab 04/01/19 0306 04/02/19 0223 04/03/19 0222  WBC 3.3* 6.7 6.0  NEUTROABS 2.4 5.0 4.8  HGB 13.9 14.0 13.4  HCT 43.7 42.9 41.1  MCV 97.5 96.4 97.4  PLT 135* 160 056*   Basic Metabolic Panel: Recent Labs  Lab 04/01/19 0306 04/02/19 0223 04/03/19 0222  NA 136 137 135  K 4.0 4.0 4.5  CL 100 103 104  CO2 22 22 20*  GLUCOSE 347* 408* 373*  BUN 33* 48* 40*  CREATININE 1.36* 1.66* 1.43*  CALCIUM 8.9 9.3 8.9  MG 2.0  --   --    Liver Function Tests: Recent Labs  Lab 04/01/19 0306 04/02/19 0223 04/03/19 0222  AST 15 17 13*  ALT 17 15 15   ALKPHOS 71 74 67  BILITOT 0.8 0.5 0.5  PROT 6.5 6.2* 5.9*  ALBUMIN 3.2* 2.9* 2.7*   HbA1C: Recent Labs    04/01/19 0306  HGBA1C 7.9*   CBG: Recent Labs  Lab 04/01/19 1646 04/01/19 2031 04/02/19 0710 04/02/19 0748 04/03/19 0815  GLUCAP 299* 397* 424* 392* 330*   Scheduled Meds: . aspirin EC  81 mg Oral Daily  . dexamethasone (DECADRON) injection  6 mg Intravenous Q24H  . enoxaparin (LOVENOX) injection  60 mg Subcutaneous Q24H  . insulin aspart  0-20 Units Subcutaneous TID WC  . insulin aspart  0-5 Units Subcutaneous QHS  . insulin glargine  30 Units Subcutaneous BID  . linagliptin  5 mg Oral Daily  . loratadine  10 mg Oral Daily  . montelukast  10 mg Oral QHS  .  pantoprazole  40 mg Oral Daily  . rosuvastatin  20 mg Oral QHS  . sodium chloride flush  3 mL Intravenous Q12H  . vitamin B-12  1,000 mcg Oral QHS   Continuous Infusions: . sodium chloride    . remdesivir 100 mg in NS 100 mL 100 mg (04/03/19 0923)     LOS: 3 days   Time spent: 35 minutes.  Patrecia Pour, MD [facility] [email or web address] 04/03/2019, 11:12 AM

## 2019-04-03 NOTE — Plan of Care (Signed)
Progressing with care plan. 

## 2019-04-04 LAB — COMPREHENSIVE METABOLIC PANEL
ALT: 15 U/L (ref 0–44)
AST: 13 U/L — ABNORMAL LOW (ref 15–41)
Albumin: 2.9 g/dL — ABNORMAL LOW (ref 3.5–5.0)
Alkaline Phosphatase: 76 U/L (ref 38–126)
Anion gap: 10 (ref 5–15)
BUN: 40 mg/dL — ABNORMAL HIGH (ref 8–23)
CO2: 24 mmol/L (ref 22–32)
Calcium: 9.2 mg/dL (ref 8.9–10.3)
Chloride: 104 mmol/L (ref 98–111)
Creatinine, Ser: 1.46 mg/dL — ABNORMAL HIGH (ref 0.44–1.00)
GFR calc Af Amer: 40 mL/min — ABNORMAL LOW (ref >60)
GFR calc non Af Amer: 35 mL/min — ABNORMAL LOW (ref >60)
Glucose, Bld: 394 mg/dL — ABNORMAL HIGH (ref 70–99)
Potassium: 4.3 mmol/L (ref 3.5–5.1)
Sodium: 138 mmol/L (ref 135–145)
Total Bilirubin: 0.6 mg/dL (ref 0.3–1.2)
Total Protein: 6.1 g/dL — ABNORMAL LOW (ref 6.5–8.1)

## 2019-04-04 LAB — CBC WITH DIFFERENTIAL/PLATELET
Abs Immature Granulocytes: 0.05 10*3/uL (ref 0.00–0.07)
Basophils Absolute: 0 10*3/uL (ref 0.0–0.1)
Basophils Relative: 0 %
Eosinophils Absolute: 0 10*3/uL (ref 0.0–0.5)
Eosinophils Relative: 0 %
HCT: 40.4 % (ref 36.0–46.0)
Hemoglobin: 13.2 g/dL (ref 12.0–15.0)
Immature Granulocytes: 1 %
Lymphocytes Relative: 16 %
Lymphs Abs: 0.9 10*3/uL (ref 0.7–4.0)
MCH: 31.3 pg (ref 26.0–34.0)
MCHC: 32.7 g/dL (ref 30.0–36.0)
MCV: 95.7 fL (ref 80.0–100.0)
Monocytes Absolute: 0.2 10*3/uL (ref 0.1–1.0)
Monocytes Relative: 4 %
Neutro Abs: 4.6 10*3/uL (ref 1.7–7.7)
Neutrophils Relative %: 79 %
Platelets: 166 10*3/uL (ref 150–400)
RBC: 4.22 MIL/uL (ref 3.87–5.11)
RDW: 13.7 % (ref 11.5–15.5)
WBC: 5.8 10*3/uL (ref 4.0–10.5)
nRBC: 0 % (ref 0.0–0.2)

## 2019-04-04 LAB — D-DIMER, QUANTITATIVE: D-Dimer, Quant: 1.53 ug/mL-FEU — ABNORMAL HIGH (ref 0.00–0.50)

## 2019-04-04 LAB — C-REACTIVE PROTEIN: CRP: 7.5 mg/dL — ABNORMAL HIGH (ref <1.0)

## 2019-04-04 MED ORDER — INSULIN GLARGINE 100 UNIT/ML ~~LOC~~ SOLN
40.0000 [IU] | Freq: Two times a day (BID) | SUBCUTANEOUS | Status: DC
  Administered 2019-04-04 (×2): 40 [IU] via SUBCUTANEOUS
  Filled 2019-04-04 (×3): qty 0.4

## 2019-04-04 MED ORDER — INSULIN ASPART 100 UNIT/ML ~~LOC~~ SOLN
12.0000 [IU] | Freq: Three times a day (TID) | SUBCUTANEOUS | Status: DC
  Administered 2019-04-04 – 2019-04-05 (×4): 12 [IU] via SUBCUTANEOUS

## 2019-04-04 NOTE — Progress Notes (Addendum)
Physical Therapy Treatment Patient Details Name: Lauren Webb MRN: [id number] DOB: 02/07/1946 Today's Date: 04/04/2019    History of Present Illness 74 year old female admitted 03/31/19 with progressively worse aches, malaise, nausea, diarrhea over 1 week. She noted decreased exercise tolerance.  In the ED, oxygen saturation in the low 90s on room air.  CTA chest: no evidence of PE but did note patchy groundglass densities bilaterally. PMH: asthma, CKD, GERD, DM 2, HTN, and HLD (no discharge today, discharge possibly 2/20 per MD)    PT Comments    Patient continues to be on room air. She reports she tolerated OOB in chair yesterday for 3 hours. During today's session, noted overall SpO2 with mobility lower than it had been during previous PT sessions. Oxygen saturation as low as 87% during gait trial --> down to 80% with seated rest during toileting task --> increased to >/=90% within approx 30 seconds sitting on toilet. Patient rates SOB level 5/10 with mobility. Continued cues for hand placement and brake mgmt for transfers although this is improving each day.  No discharge planned for today per MD who was present in room during part of session. Possible discharge tomorrow, continue steroids. MD informed of patient's oxygen saturation levels with ambulation. Recommend continued skilled PT services while in the hospital setting and home PT upon discharge home as well as home health aide. If patient with change in status and needs to discharge home on supplemental oxygen, PT needs to re-evaluate for safety with ambulation while managing oxygen tubing. Since evaluation, patient has been stable on room air during physical therapy sessions.    Follow Up Recommendations  Supervision - Intermittent;Home health PT(assist for IADLs initially)     Equipment Recommendations  (pt owns [phone], recommend use, recommend keep phone on her)       Precautions / Restrictions Precautions Precautions:  Fall Restrictions Weight Bearing Restrictions: No    Mobility  Bed Mobility Overal bed mobility: Modified Independent Bed Mobility: Supine to Sit     Supine to sit: Modified independent (Device/Increase time)     General bed mobility comments: increased time required, HOB approx 20 degrees, no bedrail  Transfers Overall transfer level: Needs assistance Equipment used: 4-wheeled walker Transfers: Sit to/from Stand Sit to Stand: Supervision;Modified independent (Device/Increase time)         General transfer comment: sit>stand from EOB with patient remembering to lock brakes but then pulling on 4WW to stand, toilet transfer modI with 4WW, stand>sit on recliner chair with cues for brake mgmt and hand placement prior to sitting. No LOB during transfers.  Ambulation/Gait Ambulation/Gait assistance: Supervision;Modified independent (Device/Increase time) Gait Distance (Feet): 90 Feet(10) Assistive device: 4-wheeled walker Gait Pattern/deviations: Step-through pattern Gait velocity: decreased   General Gait Details: Oxygen saturation as low as 87% during ambulation trial, HR 105 bpm. Education to take 4WW with her into the bathroom and to use it for all mobility as patient attempting to leave 4WW outside the door of the restroom. Once patient transferred onto toilet oxygen saturation down to 80% but quickly increased within approx 30 seconds to >/=90% on room air.        Balance     Sitting balance-Leahy Scale: Good. Sitting EOB independently approx 5 minutes     Standing balance support: Bilateral upper extremity supported   Standing balance comment: No LOB during session with use of 4WW. Intermittent standing balance with no UE or unilat UE support with patient grossly stable for short amounts of time  without LOB.          Cognition Arousal/Alertness: Awake/alert Behavior During Therapy: WFL for tasks assessed/performed       Exercises Other Exercises Other  Exercises: incentive spirometer 10 reps with cues for improved technique  Other Exercises: flutter x 10 reps with good technique        Pertinent Vitals/Pain Pain Assessment: No/denies pain(No complaints of pain. No signs/symptoms of pain.)           PT Goals (current goals can now be found in the care plan section) Progress towards PT goals: Progressing toward goals    Frequency    Min 3X/week      PT Plan Current plan remains appropriate(unless patient needs to be discharged on suppl O2)       AM-PAC PT "6 Clicks" Mobility   Outcome Measure  Help needed turning from your back to your side while in a flat bed without using bedrails?: None Help needed moving from lying on your back to sitting on the side of a flat bed without using bedrails?: None Help needed moving to and from a bed to a chair (including a wheelchair)?: None Help needed standing up from a chair using your arms (e.g., wheelchair or bedside chair)?: None Help needed to walk in hospital room?: A Little Help needed climbing 3-5 steps with a railing? : A Little 6 Click Score: 22    End of Session   Activity Tolerance: Patient limited by fatigue Patient left: in chair;with call bell/phone within reach Nurse Communication: Mobility status(SpO2 with mobility, MD informed via secure chat of SpO2) PT Visit Diagnosis: Unsteadiness on feet (R26.81);Other abnormalities of gait and mobility (R26.89);Difficulty in walking, not elsewhere classified (R26.2)     Time: 7035-0093 PT Time Calculation (min) (ACUTE ONLY): 47 min  Charges:  $Therapeutic Activity: 38-52 mins                     Birdie Hopes, DPT, PT Acute Rehab [phone]     Birdie Hopes 04/04/2019, 10:20 AM

## 2019-04-04 NOTE — Progress Notes (Signed)
PROGRESS NOTE  Lauren Webb  HUT:[id number] DOB: 08-07-1945 DOA: 03/31/2019 PCP: Ernestene Kiel, MD   Brief Narrative: Lauren Webb is a 74 y.o. female with a history of asthma, morbid obesity, T2DM, HTN, HLD, GERD, and CKD stage IV who presented to [place] on 2/15 with nonproductive cough, generalized myalgias and decreased exercise tolerance associated with poor appetite. In the ED she was afebrile, hypertensive, tachypneic with borderline oxygen saturations at rest. CBC unremarkable, CMP w/Cr 1.30, glucose 219, troponin undetectable, CRP 843 (ULN 10), d-dimer 1573 (ULN 500), PCT 0.09. ABG confirmed hypoxia with PaO2 57. CTA chest showed no PE, but did reveal patchy GGOs peripherally bilaterally. Remdesivir, decadron, and normal saline given in the ED, and the patient was admitted to [facility]. With continued treatment, hypoxia has improved, inflammatory markers have trended downward, and generalized weakness as begun to improve.   Assessment & Plan: Principal Problem:   COVID-19 virus infection Active Problems:   Essential hypertension   Mild intermittent asthma, uncomplicated   Diabetes (HCC)   Diarrhea   CKD (chronic kidney disease), stage IV (HCC)  Acute hypoxemic respiratory failure due to covid-19 pneumonia: SARS-CoV-2 positive in [facility] on 2/15.  - Today will complete remdesivir x5 days 2/15 - 2/19. - Steroids x10 days, will taper as quickly as prudent due to hyperglycemia, still unable to taper with CRP still >7 and hypoxic. - Vitamin C, zinc - Encourage OOB, IS, FV, and awake proning if able - Tylenol and antitussives prn - Continue airborne, contact precautions for 21 days from positive testing. - Check CBC w/diff, CMP, CRP daily  Asthma, allergic rhinitis:  - Continue prn albuterol, astelin, po antihistamine, added antitussives, continue singulair   AKI vs. stage IV CKD: Unclear. Will clarify diagnosis based on creatinine trends during hospitalization.   - Gave LR 2/17.  Creatinine stable. Will hold diuretics and IVF. - Monitor BMP, avoid nephrotoxins - Holding lisinopril and torsemide for now, likely to restart torsemide in next 24-48hrs.  - Reports routine f/u w/nephrology scheduled March 3, which she may attend as virtual/telemedicine visit, but not in the office as it's within isolation period.  HTN: Better control since admission - Holding lisinopril and torsemide for now. Will give hydralazine prn  IDT2DM with steroid-induced hyperglycemia:  - Home daily dosing of insulin usually 30u total. We've augmented lantus aggressively, will further increase to 40u BID, increase mealtime 8u > 12u TIDWC, and continue resistant SSI/HS coverage.  - Linagliptin added - DC steroids as soon as hypoxia has resolved.   Morbid obesity: Estimated body mass index is 46.13 kg/m as calculated from the following:   Height as of this encounter: 5\' 4"  (1.626 m).   Weight as of this encounter: 121.9 kg.   Microscopic hematuria: 1+ occult blood reported. Hgb wnl. - Recheck at follow up, proceed with work up if remains.   Covid gastroenteritis: Improving. Abd benign. - Monitor clinically  GERD:  - Continue PPI  Anxiety:  - Continue prn alprazolam  HLD:  - Continue statin  DVT prophylaxis: Lovenox 0.5mg /kg q24h (CrCl now >65ml/min) Code Status: Full Family Communication: None at bedside. No call requested. Disposition Plan: Hopeful for improvement improvement in hypoxemia and return home w/HH which has been ordered w/DME. Remains severely hyperglycemic despite aggressive insulin titration which puts her at very high risk of poor outcomes. Recurrently hypoxemic with limited functional capacity with PT evaluation today and inflammatory marker remains severely elevated. Will hold off on the plans to DC today, keep another 24-48 hours.  Consultants:   None  Procedures:   None  Antimicrobials:  Remdesivir 2/15 - 2/19  Subjective: Shortness of breath with  exertion remains moderate, a little worse today with concomitant hypoxemia w/PT evaluation. No chest pain. Has persistent nonproductive cough, worse in AM which is no better or worse from previously.  Objective: Vitals:   04/04/19 0400 04/04/19 0600 04/04/19 0719 04/04/19 0933  BP:   (!) 159/74   Pulse:  96    Resp:      Temp:   97.8 F (36.6 C)   TempSrc:   Oral   SpO2: 93%   95%  Weight:      Height:        Intake/Output Summary (Last 24 hours) at 04/04/2019 1014 Last data filed at 04/04/2019 0245 Gross per 24 hour  Intake 963 ml  Output 400 ml  Net 563 ml   Filed Weights   04/01/19 0121  Weight: 121.9 kg   Gen: 74 y.o. female in no distress Pulm: Nonlabored breathing at rest, still tachypneic. Crackles bilaterally, no wheezing. Pulls only 750-1064ml on IS with consistent cough. CV: Regular rate and rhythm. No murmur, rub, or gallop. No JVD, no pitting dependent edema. GI: Abdomen soft, non-tender, non-distended, with normoactive bowel sounds.  Ext: Warm, no deformities Skin: No rashes, lesions or ulcers on visualized skin. Neuro: Alert and oriented. No focal neurological deficits. Psych: Judgement and insight appear fair. Mood euthymic & affect congruent. Behavior is appropriate.    Data Reviewed: I have personally reviewed following labs and imaging studies  CBC: Recent Labs  Lab 04/01/19 0306 04/02/19 0223 04/03/19 0222 04/04/19 0107  WBC 3.3* 6.7 6.0 5.8  NEUTROABS 2.4 5.0 4.8 4.6  HGB 13.9 14.0 13.4 13.2  HCT 43.7 42.9 41.1 40.4  MCV 97.5 96.4 97.4 95.7  PLT 135* 160 146* 903   Basic Metabolic Panel: Recent Labs  Lab 04/01/19 0306 04/02/19 0223 04/03/19 0222 04/04/19 0107  NA 136 137 135 138  K 4.0 4.0 4.5 4.3  CL 100 103 104 104  CO2 22 22 20* 24  GLUCOSE 347* 408* 373* 394*  BUN 33* 48* 40* 40*  CREATININE 1.36* 1.66* 1.43* 1.46*  CALCIUM 8.9 9.3 8.9 9.2  MG 2.0  --   --   --    Liver Function Tests: Recent Labs  Lab 04/01/19 0306  04/02/19 0223 04/03/19 0222 04/04/19 0107  AST 15 17 13* 13*  ALT 17 15 15 15   ALKPHOS 71 74 67 76  BILITOT 0.8 0.5 0.5 0.6  PROT 6.5 6.2* 5.9* 6.1*  ALBUMIN 3.2* 2.9* 2.7* 2.9*   HbA1C: No results for input(s): HGBA1C in the last 72 hours. CBG: Recent Labs  Lab 04/01/19 2031 04/02/19 0710 04/02/19 0748 04/03/19 0815 04/03/19 1117  GLUCAP 397* 424* 392* 330* 287*   Scheduled Meds: . aspirin EC  81 mg Oral Daily  . dexamethasone (DECADRON) injection  6 mg Intravenous Q24H  . enoxaparin (LOVENOX) injection  60 mg Subcutaneous Q24H  . insulin aspart  0-20 Units Subcutaneous TID WC  . insulin aspart  0-5 Units Subcutaneous QHS  . insulin aspart  12 Units Subcutaneous TID WC  . insulin glargine  40 Units Subcutaneous BID  . linagliptin  5 mg Oral Daily  . loratadine  10 mg Oral Daily  . montelukast  10 mg Oral QHS  . pantoprazole  40 mg Oral Daily  . rosuvastatin  20 mg Oral QHS  . sodium chloride flush  3 mL  Intravenous Q12H  . vitamin B-12  1,000 mcg Oral QHS   Continuous Infusions: . sodium chloride    . remdesivir 100 mg in NS 100 mL Stopped (04/03/19 0953)     LOS: 4 days   Time spent: 35 minutes.  Patrecia Pour, MD [facility] [email or web address] 04/04/2019, 10:14 AM

## 2019-04-04 NOTE — Progress Notes (Signed)
Occupational Therapy Note  Clinical Impression: Patient has demonstrated steady progress overall level of assist at set-up to Modified Independence with extra time for safe use of rolling walker during mobility. Patient was able to mobilize to bathroom and in hallway (about 150 feet) with O2 stats dropping to 89% on room air. Patient encouraged to not use BSC in room and walk to bathroom for all toileting needs with nursing staff to improve mobility status. Notified Nursing. Patient was able to complete grooming while standing at sink with Modified Independence and proper incorporation of PLB techniques. Patient was instructed on safe techniques for bed mobility to decrease level of assist for supine to sit with incorporation of joint protection techniques. Patient will benefit from continued skilled OT acute services.    04/04/19 1440  OT Visit Information  Assistance Needed +1  History of Present Illness 74 year old female admitted 03/31/19 with progressively worse aches, malaise, nausea, diarrhea over 1 week. She noted decreased exercise tolerance.  In the ED, oxygen saturation in the low 90s on room air.  CTA chest: no evidence of PE but did note patchy groundglass densities bilaterally. PMH: asthma, CKD, GERD, DM 2, HTN, and HLD   Precautions  Precautions Fall  Pain Assessment  Pain Assessment 0-10  Pain Score 6  Pain Location back and head  Pain Descriptors / Indicators Nagging  Pain Intervention(s) Limited activity within patient's tolerance  Cognition  Arousal/Alertness Awake/alert  Behavior During Therapy WFL for tasks assessed/performed  Overall Cognitive Status Within Functional Limits for tasks assessed  ADL  Grooming Wash/dry hands;Modified independent  Neurosurgeon- Clothing Manipulation and Hygiene Modified independent  Functional mobility during ADLs Supervision/safety;Rolling walker  Bed Mobility  Overal bed mobility Modified Independent   Bed Mobility Supine to Sit  Supine to sit Modified independent (Device/Increase time)  Sit to supine Modified independent (Device/Increase time)  Balance  Sitting balance-Leahy Scale Good  Standing balance support Bilateral upper extremity supported  Standing balance comment No LOB during session with use of 4WW. Intermittent standing balance with no UE or unilat UE support with patient grossly stable.   Restrictions  Weight Bearing Restrictions No  Transfers  Overall transfer level Needs assistance  Equipment used Rolling walker (2 wheeled)  Transfers Sit to/from Stand;Stand Pivot Transfers  Sit to Stand Modified independent (Device/Increase time)  Other Exercises  Other Exercises incentive spirometer 10 reps with cues for improved technique   Other Exercises flutter x 10 reps with good technique  OT - End of Session  Equipment Utilized During Treatment Rolling walker  Activity Tolerance Patient tolerated treatment well  Patient left in bed;with call bell/phone within reach  Nurse Communication Mobility status  OT Assessment/Plan  Follow Up Recommendations Home health OT;Other (comment) (home health aide)  OT Equipment 3 in 1 bedside commode;Tub/shower bench  AM-PAC OT "6 Clicks" Daily Activity Outcome Measure (Version 2)  Help from another person eating meals? 4  Help from another person taking care of personal grooming? 4  Help from another person toileting, which includes using toliet, bedpan, or urinal? 3  Help from another person bathing (including washing, rinsing, drying)? 3  Help from another person to put on and taking off regular upper body clothing? 3  Help from another person to put on and taking off regular lower body clothing? 3  6 Click Score 20  OT Goal Progression  Progress towards OT goals Progressing toward goals  OT Time Calculation  OT Start Time (ACUTE ONLY)  1418  OT Stop Time (ACUTE ONLY) 1455  OT Time Calculation (min) 37 min  OT General Charges   $OT Visit 1 Visit  OT Treatments  $Self Care/Home Management  8-22 mins  $Therapeutic Exercise 8-22 mins   Mauricia Mertens OTR/L
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
## 2019-04-05 LAB — C-REACTIVE PROTEIN: CRP: 4.8 mg/dL — ABNORMAL HIGH (ref <1.0)

## 2019-04-05 LAB — COMPREHENSIVE METABOLIC PANEL
ALT: 16 U/L (ref 0–44)
AST: 12 U/L — ABNORMAL LOW (ref 15–41)
Albumin: 2.8 g/dL — ABNORMAL LOW (ref 3.5–5.0)
Alkaline Phosphatase: 77 U/L (ref 38–126)
Anion gap: 10 (ref 5–15)
BUN: 44 mg/dL — ABNORMAL HIGH (ref 8–23)
CO2: 24 mmol/L (ref 22–32)
Calcium: 9.5 mg/dL (ref 8.9–10.3)
Chloride: 102 mmol/L (ref 98–111)
Creatinine, Ser: 1.28 mg/dL — ABNORMAL HIGH (ref 0.44–1.00)
GFR calc Af Amer: 47 mL/min — ABNORMAL LOW (ref >60)
GFR calc non Af Amer: 41 mL/min — ABNORMAL LOW (ref >60)
Glucose, Bld: 390 mg/dL — ABNORMAL HIGH (ref 70–99)
Potassium: 4.7 mmol/L (ref 3.5–5.1)
Sodium: 136 mmol/L (ref 135–145)
Total Bilirubin: 0.5 mg/dL (ref 0.3–1.2)
Total Protein: 6.2 g/dL — ABNORMAL LOW (ref 6.5–8.1)

## 2019-04-05 LAB — CBC WITH DIFFERENTIAL/PLATELET
Abs Immature Granulocytes: 0.1 10*3/uL — ABNORMAL HIGH (ref 0.00–0.07)
Basophils Absolute: 0 10*3/uL (ref 0.0–0.1)
Basophils Relative: 0 %
Eosinophils Absolute: 0 10*3/uL (ref 0.0–0.5)
Eosinophils Relative: 0 %
HCT: 41.5 % (ref 36.0–46.0)
Hemoglobin: 13.7 g/dL (ref 12.0–15.0)
Immature Granulocytes: 2 %
Lymphocytes Relative: 15 %
Lymphs Abs: 0.9 10*3/uL (ref 0.7–4.0)
MCH: 31.3 pg (ref 26.0–34.0)
MCHC: 33 g/dL (ref 30.0–36.0)
MCV: 94.7 fL (ref 80.0–100.0)
Monocytes Absolute: 0.2 10*3/uL (ref 0.1–1.0)
Monocytes Relative: 3 %
Neutro Abs: 4.8 10*3/uL (ref 1.7–7.7)
Neutrophils Relative %: 80 %
Platelets: 203 10*3/uL (ref 150–400)
RBC: 4.38 MIL/uL (ref 3.87–5.11)
RDW: 13.6 % (ref 11.5–15.5)
WBC: 6.1 10*3/uL (ref 4.0–10.5)
nRBC: 0 % (ref 0.0–0.2)

## 2019-04-05 LAB — GLUCOSE, CAPILLARY: Glucose-Capillary: 269 mg/dL — ABNORMAL HIGH (ref 70–99)

## 2019-04-05 LAB — D-DIMER, QUANTITATIVE: D-Dimer, Quant: 1.48 ug/mL-FEU — ABNORMAL HIGH (ref 0.00–0.50)

## 2019-04-05 MED ORDER — INSULIN ASPART 100 UNIT/ML ~~LOC~~ SOLN
16.0000 [IU] | Freq: Three times a day (TID) | SUBCUTANEOUS | Status: DC
  Administered 2019-04-05 (×2): 16 [IU] via SUBCUTANEOUS

## 2019-04-05 MED ORDER — ALPRAZOLAM 0.25 MG PO TABS
0.2500 mg | ORAL_TABLET | Freq: Every day | ORAL | Status: DC
  Administered 2019-04-05 – 2019-04-06 (×2): 0.25 mg via ORAL
  Filled 2019-04-05 (×2): qty 1

## 2019-04-05 MED ORDER — LIP MEDEX EX OINT
TOPICAL_OINTMENT | CUTANEOUS | Status: DC | PRN
  Administered 2019-04-05: 1 via TOPICAL
  Filled 2019-04-05: qty 7

## 2019-04-05 MED ORDER — INSULIN GLARGINE 100 UNIT/ML ~~LOC~~ SOLN
45.0000 [IU] | Freq: Two times a day (BID) | SUBCUTANEOUS | Status: DC
  Administered 2019-04-05 (×2): 45 [IU] via SUBCUTANEOUS
  Filled 2019-04-05 (×3): qty 0.45

## 2019-04-05 NOTE — Progress Notes (Signed)
Pt. deffered   04/05/19 0800  Family/Significant Other Communication  Family/Significant Other Update Updated (by patient)

## 2019-04-05 NOTE — Plan of Care (Signed)
Problem: Education: Goal: Knowledge of General Education information will improve Description: Including pain rating scale, medication(s)/side effects and non-pharmacologic comfort measures 04/05/2019 1735 by Leata Mouse, RN Outcome: Progressing 04/05/2019 1735 by Leata Mouse, RN Outcome: Progressing 04/05/2019 1733 by Leata Mouse, RN Outcome: Progressing 04/05/2019 1732 by Leata Mouse, RN Outcome: Progressing   Problem: Health Behavior/Discharge Planning: Goal: Ability to manage health-related needs will improve 04/05/2019 1735 by Leata Mouse, RN Outcome: Progressing 04/05/2019 1735 by Leata Mouse, RN Outcome: Progressing 04/05/2019 1733 by Leata Mouse, RN Outcome: Progressing 04/05/2019 1732 by Leata Mouse, RN Outcome: Progressing   Problem: Clinical Measurements: Goal: Ability to maintain clinical measurements within normal limits will improve 04/05/2019 1735 by Leata Mouse, RN Outcome: Progressing 04/05/2019 1735 by Leata Mouse, RN Outcome: Progressing 04/05/2019 1733 by Leata Mouse, RN Outcome: Progressing 04/05/2019 1732 by Leata Mouse, RN Outcome: Progressing Goal: Will remain free from infection 04/05/2019 1735 by Leata Mouse, RN Outcome: Progressing 04/05/2019 1735 by Leata Mouse, RN Outcome: Progressing 04/05/2019 1733 by Leata Mouse, RN Outcome: Progressing 04/05/2019 1732 by Leata Mouse, RN Outcome: Progressing Goal: Diagnostic test results will improve 04/05/2019 1735 by Leata Mouse, RN Outcome: Progressing 04/05/2019 1735 by Leata Mouse, RN Outcome: Progressing 04/05/2019 1733 by Leata Mouse, RN Outcome: Progressing 04/05/2019 1732 by Leata Mouse, RN Outcome: Progressing Goal: Respiratory complications will improve 04/05/2019 1735 by Leata Mouse, RN Outcome:  Progressing 04/05/2019 1735 by Leata Mouse, RN Outcome: Progressing 04/05/2019 1733 by Leata Mouse, RN Outcome: Progressing 04/05/2019 1732 by Leata Mouse, RN Outcome: Progressing Goal: Cardiovascular complication will be avoided 04/05/2019 1735 by Leata Mouse, RN Outcome: Progressing 04/05/2019 1735 by Leata Mouse, RN Outcome: Progressing 04/05/2019 1733 by Leata Mouse, RN Outcome: Progressing 04/05/2019 1732 by Leata Mouse, RN Outcome: Progressing   Problem: Clinical Measurements: Goal: Ability to maintain clinical measurements within normal limits will improve 04/05/2019 1732 by Leata Mouse, RN Outcome: Progressing   Problem: Activity: Goal: Risk for activity intolerance will decrease 04/05/2019 1735 by Leata Mouse, RN Outcome: Progressing 04/05/2019 1735 by Leata Mouse, RN Outcome: Progressing 04/05/2019 1733 by Leata Mouse, RN Outcome: Progressing 04/05/2019 1732 by Leata Mouse, RN Outcome: Progressing   Problem: Nutrition: Goal: Adequate nutrition will be maintained 04/05/2019 1735 by Leata Mouse, RN Outcome: Progressing 04/05/2019 1735 by Leata Mouse, RN Outcome: Progressing 04/05/2019 1733 by Leata Mouse, RN Outcome: Progressing 04/05/2019 1732 by Leata Mouse, RN Outcome: Progressing   Problem: Coping: Goal: Level of anxiety will decrease 04/05/2019 1735 by Leata Mouse, RN Outcome: Progressing 04/05/2019 1735 by Leata Mouse, RN Outcome: Progressing 04/05/2019 1733 by Leata Mouse, RN Outcome: Progressing 04/05/2019 1732 by Leata Mouse, RN Outcome: Progressing   Problem: Elimination: Goal: Will not experience complications related to bowel motility 04/05/2019 1735 by Leata Mouse, RN Outcome: Progressing 04/05/2019 1735 by Leata Mouse, RN Outcome:  Progressing 04/05/2019 1733 by Leata Mouse, RN Outcome: Progressing 04/05/2019 1732 by Leata Mouse, RN Outcome: Progressing Goal: Will not experience complications related to urinary retention 04/05/2019 1735 by Leata Mouse, RN Outcome: Progressing 04/05/2019 1735 by Leata Mouse, RN Outcome: Progressing 04/05/2019 1733 by Leata Mouse, RN Outcome: Progressing 04/05/2019 1732 by Leata Mouse, RN Outcome: Progressing   Problem: Pain Managment: Goal: General experience of comfort will improve 04/05/2019 1735  by Leata Mouse, RN Outcome: Progressing 04/05/2019 1735 by Leata Mouse, RN Outcome: Progressing 04/05/2019 1733 by Leata Mouse, RN Outcome: Progressing 04/05/2019 1732 by Leata Mouse, RN Outcome: Progressing   Problem: Safety: Goal: Ability to remain free from injury will improve 04/05/2019 1735 by Leata Mouse, RN Outcome: Progressing 04/05/2019 1735 by Leata Mouse, RN Outcome: Progressing 04/05/2019 1733 by Leata Mouse, RN Outcome: Progressing 04/05/2019 1732 by Leata Mouse, RN Outcome: Progressing   Problem: Skin Integrity: Goal: Risk for impaired skin integrity will decrease 04/05/2019 1735 by Leata Mouse, RN Outcome: Progressing 04/05/2019 1735 by Leata Mouse, RN Outcome: Progressing 04/05/2019 1733 by Leata Mouse, RN Outcome: Progressing 04/05/2019 1732 by Leata Mouse, RN Outcome: Progressing

## 2019-04-05 NOTE — Progress Notes (Signed)
Occupational Therapy Note  Clinical Impression Patient has demonstrated steady progress with ADL overall level of assist at set-up to Modified [place] with extra time secondary to lumbar pain. Patient was able to mobilize to bathroom and in hallway (about 200 feet) with O2 stats in the 90s. Patient has progressed to toileting at Modified [place] level.  Patient was able to complete grooming while standing at sink with Modified [place] and proper incorporation of PLB techniques. Patient was able to safely perform bathing task in standing and sitting to conserve energy at sink level.  Patient had no signs of dyspnea and was able to verbalize importance of energy conservation techniques. Patient will benefit from continued skilled OT acute services.    04/05/19 0823  OT Visit Information  Assistance Needed +1  History of Present Illness 74 year old female admitted 03/31/19 with progressively worse aches, malaise, nausea, diarrhea over 1 week. She noted decreased exercise tolerance.  In the ED, oxygen saturation in the low 90s on room air.  CTA chest: no evidence of PE but did note patchy groundglass densities bilaterally. PMH: asthma, CKD, GERD, DM 2, HTN, and HLD   Precautions  Precautions Fall  Cognition  Arousal/Alertness Awake/alert  Behavior During Therapy WFL for tasks assessed/performed  Overall Cognitive Status Within Functional Limits for tasks assessed  ADL  Grooming Wash/dry hands;Modified independent;Oral care;Wash/dry face;Brushing hair  Upper Body Bathing Set up  Lower Body Bathing Set up  Upper Body Dressing  Set up  Lower Body Dressing Set up  Toilet Transfer Modified [place]  Toileting- Clothing Manipulation and Hygiene Modified Independence  Functional mobility during ADLs Supervision/safety;Rolling walker  Bed Mobility  Overal bed mobility Modified Independent  Bed Mobility Supine to Sit (HOB elevated to 90 degrees)  Balance  Sitting balance-Leahy  Scale Good  Standing balance-Leahy Scale Fair  Restrictions  Weight Bearing Restrictions No  Transfers  Overall transfer level Modified independent  Transfers Sit to/from Stand;Stand Pivot Transfers  Sit to Stand Modified independent (Device/Increase time)  Stand pivot transfers Supervision  OT - End of Session  Equipment Utilized During Treatment Rolling walker  Activity Tolerance Patient tolerated treatment well  Patient left with call bell/phone within reach;in chair  Nurse Communication Mobility status  OT Assessment/Plan  Follow Up Recommendations Home health OT;Other (comment)  OT Equipment 3 in 1 bedside commode;Tub/shower bench;Other (comment) (home health aide)  AM-PAC OT "6 Clicks" Daily Activity Outcome Measure (Version 2)  Help from another person eating meals? 4  Help from another person taking care of personal grooming? 4  Help from another person toileting, which includes using toliet, bedpan, or urinal? 4  Help from another person bathing (including washing, rinsing, drying)? 3  Help from another person to put on and taking off regular upper body clothing? 3  Help from another person to put on and taking off regular lower body clothing? 3  6 Click Score 20  OT Goal Progression  Progress towards OT goals Progressing toward goals  OT Time Calculation  OT Start Time (ACUTE ONLY) 0745  OT Stop Time (ACUTE ONLY) 0825  OT Time Calculation (min) 40 min  OT General Charges  $OT Visit 1 Visit  OT Treatments  $Self Care/Home Management  23-37 mins  $Therapeutic Activity 8-22 mins   Johnnisha Forton OTR/L

## 2019-04-05 NOTE — Plan of Care (Signed)

## 2019-04-05 NOTE — Progress Notes (Signed)
PROGRESS NOTE  Lauren Webb  VEH:[id number] DOB: Jan 13, 1946 DOA: 03/31/2019 PCP: Ernestene Kiel, MD   Brief Narrative: Lauren Webb is a 74 y.o. female with a history of asthma, morbid obesity, T2DM, HTN, HLD, GERD, and CKD stage IV who presented to [facility] on 2/15 with nonproductive cough, generalized myalgias and decreased exercise tolerance associated with poor appetite. In the ED she was afebrile, hypertensive, tachypneic with borderline oxygen saturations at rest. CBC unremarkable, CMP w/Cr 1.30, glucose 219, troponin undetectable, CRP 843 (ULN 10), d-dimer 1573 (ULN 500), PCT 0.09. ABG confirmed hypoxia with PaO2 57. CTA chest showed no PE, but did reveal patchy GGOs peripherally bilaterally. Remdesivir, decadron, and normal saline given in the ED, and the patient was admitted to [facility]. Hospitalization has been complicated by continued weakness, exertional hypoxia, and steroid-induced hyperglycemia.   Assessment & Plan: Principal Problem:   COVID-19 virus infection Active Problems:   Essential hypertension   Mild intermittent asthma, uncomplicated   Diabetes (HCC)   Diarrhea   CKD (chronic kidney disease), stage IV (HCC)  Acute hypoxemic respiratory failure due to covid-19 pneumonia: SARS-CoV-2 positive in [facility] on 2/15.  - Completed remdesivir x5 days 2/15 - 2/19. - Steroids x10 days, will taper as quickly as prudent due to hyperglycemia, still unable to taper with CRP elevation and hypoxemia. - Vitamin C, zinc - Encourage OOB, IS as much as possible. Suspect atelectasis contributing. - Tylenol and antitussives prn - Continue airborne, contact precautions for 21 days from positive testing. - Check CBC w/diff, CMP, CRP daily  Asthma, allergic rhinitis:  - Continue prn albuterol, astelin, po antihistamine, added antitussives, continue singulair   AKI vs. stage IV CKD: Unclear. Will clarify diagnosis based on creatinine trends during hospitalization. Based on improvement, AKI may  have been more severe and CKD may be only stage IIIa.    - Gave LR 2/17. Creatinine stable. Continue to hold diuretics and IVF. - Monitor BMP, avoid nephrotoxins - Holding lisinopril   - Reports routine f/u w/nephrology scheduled March 3, which she may attend as virtual/telemedicine visit, but not in the office as it's within isolation period.  HTN: Better control since admission - Holding lisinopril and torsemide for now. Will give hydralazine prn  IDT2DM with steroid-induced hyperglycemia:  - Home daily dosing of insulin usually 30u total. We've augmented lantus aggressively, will further increase to 45u BID, increase mealtime again today by 1/3 up to 16u TIDWC, and continue resistant SSI/HS coverage.  - Linagliptin added - DC steroids as soon as hypoxia has resolved.  Morbid obesity: Estimated body mass index is 46.13 kg/m as calculated from the following:   Height as of this encounter: 5\' 4"  (1.626 m).   Weight as of this encounter: 121.9 kg.   Microscopic hematuria: 1+ occult blood reported. Hgb wnl. - Recheck at follow up, proceed with work up if remains.   Covid gastroenteritis: Improving. Abd benign. - Monitor clinically  GERD:  - Continue PPI  Anxiety:  - Continue prn alprazolam  HLD:  - Continue statin  DVT prophylaxis: Lovenox 0.5mg /kg q24h  Code Status: Full Family Communication: None at bedside. No call requested. Disposition Plan: Likely to return home where she lives alone once clinically improved. She required more supplemental oxygen with exertion yesterday per PT and continue to have elevated inflammatory marker with need for steroids which results in severe hyperglycemia despite augmentation of insulin. This with her baseline frailty makes adverse outcomes more likely, so will continue steroids, intensive glucose monitoring, daily therapy  efforts, and close monitoring for at least 24 more hours.    Consultants:   None  Procedures:    None  Antimicrobials:  Remdesivir 2/15 - 2/19  Subjective: Coughing throughout encounter, still moderate, persistent nonproductive cough associated with mild shortness of breath worse on exertion. Still can only pull ~750-1050ml on IS.   Objective: Vitals:   04/04/19 1937 04/05/19 0010 04/05/19 0400 04/05/19 0745  BP:  (!) 143/55 (!) 142/83 (!) 145/77  Pulse:  80 77 88  Resp: 18 20 20 20   Temp: 98.7 F (37.1 C) 98.3 F (36.8 C) 97.9 F (36.6 C) 97.6 F (36.4 C)  TempSrc: Oral Oral Oral Axillary  SpO2:  91% 94% 97%  Weight:      Height:       No intake or output data in the 24 hours ending 04/05/19 0913 Filed Weights   04/01/19 0121  Weight: 121.9 kg   Gen: 74 y.o. female in no distress Pulm: Nonlabored but now tachypneic at rest. Crackles remain bilaterally in mid zones, improves somewhat with cough. CV: Regular rate and rhythm. No murmur, rub, or gallop. No JVD, no pitting dependent edema. GI: Abdomen soft, non-tender, non-distended, with normoactive bowel sounds.  Ext: Warm, no deformities Skin: No rashes, lesions or ulcers on visualized skin. Neuro: Alert and oriented. No focal neurological deficits. Psych: Judgement and insight appear fair. Mood euthymic & affect congruent. Behavior is appropriate.    Data Reviewed: I have personally reviewed following labs and imaging studies  CBC: Recent Labs  Lab 04/01/19 0306 04/02/19 0223 04/03/19 0222 04/04/19 0107 04/05/19 0127  WBC 3.3* 6.7 6.0 5.8 6.1  NEUTROABS 2.4 5.0 4.8 4.6 4.8  HGB 13.9 14.0 13.4 13.2 13.7  HCT 43.7 42.9 41.1 40.4 41.5  MCV 97.5 96.4 97.4 95.7 94.7  PLT 135* 160 146* 166 683   Basic Metabolic Panel: Recent Labs  Lab 04/01/19 0306 04/02/19 0223 04/03/19 0222 04/04/19 0107 04/05/19 0127  NA 136 137 135 138 136  K 4.0 4.0 4.5 4.3 4.7  CL 100 103 104 104 102  CO2 22 22 20* 24 24  GLUCOSE 347* 408* 373* 394* 390*  BUN 33* 48* 40* 40* 44*  CREATININE 1.36* 1.66* 1.43* 1.46* 1.28*   CALCIUM 8.9 9.3 8.9 9.2 9.5  MG 2.0  --   --   --   --    Liver Function Tests: Recent Labs  Lab 04/01/19 0306 04/02/19 0223 04/03/19 0222 04/04/19 0107 04/05/19 0127  AST 15 17 13* 13* 12*  ALT 17 15 15 15 16   ALKPHOS 71 74 67 76 77  BILITOT 0.8 0.5 0.5 0.6 0.5  PROT 6.5 6.2* 5.9* 6.1* 6.2*  ALBUMIN 3.2* 2.9* 2.7* 2.9* 2.8*   HbA1C: No results for input(s): HGBA1C in the last 72 hours. CBG: Recent Labs  Lab 04/01/19 2031 04/02/19 0710 04/02/19 0748 04/03/19 0815 04/03/19 1117  GLUCAP 397* 424* 392* 330* 287*   Scheduled Meds: . aspirin EC  81 mg Oral Daily  . dexamethasone (DECADRON) injection  6 mg Intravenous Q24H  . enoxaparin (LOVENOX) injection  60 mg Subcutaneous Q24H  . insulin aspart  0-20 Units Subcutaneous TID WC  . insulin aspart  0-5 Units Subcutaneous QHS  . insulin aspart  12 Units Subcutaneous TID WC  . insulin glargine  40 Units Subcutaneous BID  . linagliptin  5 mg Oral Daily  . loratadine  10 mg Oral Daily  . montelukast  10 mg Oral QHS  . pantoprazole  40 mg Oral Daily  . rosuvastatin  20 mg Oral QHS  . sodium chloride flush  3 mL Intravenous Q12H  . vitamin B-12  1,000 mcg Oral QHS   Continuous Infusions: . sodium chloride       LOS: 5 days   Time spent: 35 minutes.  Patrecia Pour, MD [facility] [email or web address] 04/05/2019, 9:13 AM

## 2019-04-05 NOTE — Progress Notes (Signed)
PHYSICAL THERAPY PROGRESS NOTE:   CLINICAL IMPRESSION: Pt did well with mobility, pt found in bed, able to get oob with mod I, sits unsupported don bed/commode able to complete UE activities without loss of balance. Pt able to ambulate in room and in hall approx 245ft w/ RW and SBA, on room air sats remain in high 80s-low 90s. Pt did need some cues for safety with mobility to rest room, but agreeable that does need AD when ambulating. Pt reinforced on importance of incentive spirometer and flutter valve use and verbalizes understanding.    04/05/19 1500  PT Visit Information  Last PT Received On 04/05/19  Assistance Needed +1  History of Present Illness 74 year old female admitted 03/31/19 with progressively worse aches, malaise, nausea, diarrhea over 1 week. She noted decreased exercise tolerance.  In the ED, oxygen saturation in the low 90s on room air.  CTA chest: no evidence of PE but did note patchy groundglass densities bilaterally. PMH: asthma, CKD, GERD, DM 2, HTN, and HLD   Subjective Data  Patient Stated Goal to return home   Precautions  Precautions Fall  Restrictions  Weight Bearing Restrictions No  Pain Assessment  Pain Assessment No/denies pain  Cognition  Arousal/Alertness Awake/alert  Behavior During Therapy WFL for tasks assessed/performed  Overall Cognitive Status No family/caregiver present to determine baseline cognitive functioning  General Comments seems to be close to baseline but still has some safety concerns  Bed Mobility  Overal bed mobility Modified Independent  Transfers  Overall transfer level Needs assistance  Equipment used Rolling walker (2 wheeled)  Transfers Sit to/from Stand;Stand Pivot Transfers  Sit to Stand Supervision  Stand pivot transfers Supervision  General transfer comment needs some safety cues   Ambulation/Gait  Ambulation/Gait assistance Supervision  Gait Distance (Feet) 250 Feet  Assistive device Rolling walker (2 wheeled)  Gait  Pattern/deviations Step-through pattern;Wide base of support  General Gait Details on room air and able to maintain sats in high 80s to low 90s  Balance  Overall balance assessment Needs assistance  Sitting-balance support Feet supported  Sitting balance-Leahy Scale Good  Sitting balance - Comments sits unsupported without UE support and completed UE activity w/o LOB  Standing balance support During functional activity;Bilateral upper extremity supported  Standing balance-Leahy Scale Fair  PT - End of Session  Activity Tolerance Patient limited by fatigue  Patient left in bed;with call bell/phone within reach  Nurse Communication Mobility status   PT - Assessment/Plan  PT Plan Current plan remains appropriate  PT Visit Diagnosis Unsteadiness on feet (R26.81);Other abnormalities of gait and mobility (R26.89);Difficulty in walking, not elsewhere classified (R26.2)  PT Frequency (ACUTE ONLY) Min 3X/week  Follow Up Recommendations Supervision - Intermittent;Home health PT  AM-PAC PT "6 Clicks" Mobility Outcome Measure (Version 2)  Help needed turning from your back to your side while in a flat bed without using bedrails? 4  Help needed moving from lying on your back to sitting on the side of a flat bed without using bedrails? 4  Help needed moving to and from a bed to a chair (including a wheelchair)? 4  Help needed standing up from a chair using your arms (e.g., wheelchair or bedside chair)? 3  Help needed to walk in hospital room? 3  Help needed climbing 3-5 steps with a railing?  3  6 Click Score 21  Consider Recommendation of Discharge To: Home with no services  PT Goal Progression  Progress towards PT goals Progressing toward goals  Acute Rehab PT Goals  PT Goal Formulation With patient  Time For Goal Achievement 04/15/19  Potential to Achieve Goals Good  PT Time Calculation  PT Start Time (ACUTE ONLY) 1438  PT Stop Time (ACUTE ONLY) 1455  PT Time Calculation (min) (ACUTE ONLY)  17 min  PT General Charges  $$ ACUTE PT VISIT 1 Visit  PT Treatments  $Gait Training 8-22 mins    Horald Chestnut, PT

## 2019-04-06 LAB — BASIC METABOLIC PANEL
Anion gap: 9 (ref 5–15)
BUN: 48 mg/dL — ABNORMAL HIGH (ref 8–23)
CO2: 26 mmol/L (ref 22–32)
Calcium: 9.7 mg/dL (ref 8.9–10.3)
Chloride: 104 mmol/L (ref 98–111)
Creatinine, Ser: 1.33 mg/dL — ABNORMAL HIGH (ref 0.44–1.00)
GFR calc Af Amer: 45 mL/min — ABNORMAL LOW (ref >60)
GFR calc non Af Amer: 39 mL/min — ABNORMAL LOW (ref >60)
Glucose, Bld: 289 mg/dL — ABNORMAL HIGH (ref 70–99)
Potassium: 4.7 mmol/L (ref 3.5–5.1)
Sodium: 139 mmol/L (ref 135–145)

## 2019-04-06 LAB — C-REACTIVE PROTEIN: CRP: 2 mg/dL — ABNORMAL HIGH (ref <1.0)

## 2019-04-06 MED ORDER — PSYLLIUM 95 % PO PACK
1.0000 | PACK | Freq: Every day | ORAL | Status: DC
  Administered 2019-04-06 – 2019-04-07 (×2): 1 via ORAL
  Filled 2019-04-06 (×2): qty 1

## 2019-04-06 MED ORDER — INSULIN GLARGINE 100 UNIT/ML ~~LOC~~ SOLN
55.0000 [IU] | Freq: Two times a day (BID) | SUBCUTANEOUS | Status: DC
  Administered 2019-04-06 – 2019-04-07 (×2): 55 [IU] via SUBCUTANEOUS
  Filled 2019-04-06 (×4): qty 0.55

## 2019-04-06 MED ORDER — INSULIN ASPART 100 UNIT/ML ~~LOC~~ SOLN
20.0000 [IU] | Freq: Three times a day (TID) | SUBCUTANEOUS | Status: DC
  Administered 2019-04-06 – 2019-04-07 (×5): 20 [IU] via SUBCUTANEOUS

## 2019-04-06 NOTE — Progress Notes (Signed)
Pt. deferred   04/06/19 1400  Family/Significant Other Communication  Family/Significant Other Update Updated (by patient)

## 2019-04-06 NOTE — Progress Notes (Signed)
Physical Therapy Treatment Patient Details Name: Lauren Webb MRN: [id number] DOB: 06/10/1945 Today's Date: 04/06/2019    History of Present Illness 74 year old female admitted 03/31/19 with progressively worse aches, malaise, nausea, diarrhea over 1 week. She noted decreased exercise tolerance.  In the ED, oxygen saturation in the low 90s on room air.  CTA chest: no evidence of PE but did note patchy groundglass densities bilaterally. PMH: asthma, CKD, GERD, DM 2, HTN, and HLD     PT Comments    Pt demonstrates good motivation to work with PT. Pt on RA upon arrival sitting in chair. Tx focused on balance assessment and activity tolerance/gait distance. Pt demonstrates score of 32 on BERG indicating high fall risk. Her primary area of deficits are unsupported balance. Her gait speed with Rolator is measured at level for community ambulation with fair gait demonstrating only wide BOS.  Overall her SpO2 was maintained above 95% on RA today with patient being limited with fatigue. She was able to ambulate 15 feet further than the other day.  Pt was educated on performing weight shifting to improve steppage strategy with balance at home, pt verbalized understanding but will need further education for reinforcement. Pt can continue to benefit from skilled acute therapy to improve activity tolerance, LE strength, and balance. RN notified of patient performance.  Follow Up Recommendations  Supervision - Intermittent;Home health PT     Equipment Recommendations  None recommended by PT    Recommendations for Other Services       Precautions / Restrictions Precautions Precautions: Fall Restrictions Weight Bearing Restrictions: No    Mobility   Transfers Overall transfer level: Needs assistance Equipment used: Rolling walker (2 wheeled) Transfers: Sit to/from [employer] Transfers Sit to Stand: Supervision Stand pivot transfers: Supervision       General transfer comment: Pt  requires UE support for balance safety and indepence due to lack of steppage strategy with mild-moderate perturbations without UE support.  Ambulation/Gait Ambulation/Gait assistance: Supervision Gait Distance (Feet): 215 Feet Assistive device: 4-wheeled walker Gait Pattern/deviations: Step-through pattern;Wide base of support   Gait velocity interpretation: 1.31 - 2.62 ft/sec, indicative of limited community ambulator(with rollator) General Gait Details: on room air and able to maintain sats in mid 90s throughout       Balance Overall balance assessment: Needs assistance Sitting-balance support: Feet supported Sitting balance-Leahy Scale: Good Sitting balance - Comments: sits unsupported without UE support and completed UE activity w/o LOB   Standing balance support: No upper extremity supported Standing balance-Leahy Scale: Poor Standing balance comment: No LOB during session with use of 4WW. Intermittent standing balance with no UE or unilat UE support with patient grossly stable.                  Standardized Balance Assessment Standardized Balance Assessment : Berg Balance Test Berg Balance Test Sit to Stand: Able to stand  independently using hands Standing Unsupported: Able to stand safely 2 minutes Sitting with Back Unsupported but Feet Supported on Floor or Stool: Able to sit safely and securely 2 minutes Stand to Sit: Sits safely with minimal use of hands Transfers: Able to transfer safely, definite need of hands Standing Unsupported with Eyes Closed: Able to stand 10 seconds with supervision Standing Ubsupported with Feet Together: Able to place feet together independently and stand 1 minute safely From Standing, Reach Forward with Outstretched Arm: Can reach forward >12 cm safely (5") From Standing Position, Pick up Object from Floor: Unable to try/needs  assist to keep balance From Standing Position, Turn to Look Behind Over each Shoulder: Needs supervision when  turning Turn 360 Degrees: Able to turn 360 degrees safely but slowly Standing Unsupported, Alternately Place Feet on Step/Stool: Needs assistance to keep from falling or unable to try Standing Unsupported, One Foot in Front: Loses balance while stepping or standing Standing on One Leg: Tries to lift leg/unable to hold 3 seconds but remains standing independently Total Score: 32        Cognition Arousal/Alertness: Awake/alert Behavior During Therapy: WFL for tasks assessed/performed Overall Cognitive Status: No family/caregiver present to determine baseline cognitive functioning                   Pertinent Vitals/Pain Pain Assessment: No/denies pain Pain Score: 0-No pain           PT Goals (current goals can now be found in the care plan section) Acute Rehab PT Goals Patient Stated Goal: to return home  PT Goal Formulation: With patient Time For Goal Achievement: 04/15/19 Potential to Achieve Goals: Good Progress towards PT goals: Progressing toward goals    Frequency    Min 3X/week      PT Plan Current plan remains appropriate       AM-PAC PT "6 Clicks" Mobility   Outcome Measure  Help needed turning from your back to your side while in a flat bed without using bedrails?: None Help needed moving from lying on your back to sitting on the side of a flat bed without using bedrails?: None Help needed moving to and from a bed to a chair (including a wheelchair)?: None Help needed standing up from a chair using your arms (e.g., wheelchair or bedside chair)?: A Little Help needed to walk in hospital room?: A Little Help needed climbing 3-5 steps with a railing? : A Little 6 Click Score: 21    End of Session   Activity Tolerance: Patient limited by fatigue Patient left: in bed;with call bell/phone within reach Nurse Communication: Mobility status PT Visit Diagnosis: Unsteadiness on feet (R26.81);Other abnormalities of gait and mobility (R26.89);Difficulty in  walking, not elsewhere classified (R26.2)     Time: 9767-3419 PT Time Calculation (min) (ACUTE ONLY): 32 min  Charges:  $Gait Training: 8-22 mins $Neuromuscular Re-education: 8-22 mins                     Ann Held PT, DPT Acute [place] [facility]: [place] 04/06/2019, 11:43 AM

## 2019-04-06 NOTE — Progress Notes (Signed)
PROGRESS NOTE  TASMIA BLUMER  BUL:[id number] DOB: 07-11-1945 DOA: 03/31/2019 PCP: Ernestene Kiel, MD   Brief Narrative: JADENE Webb is a 74 y.o. female with a history of asthma, morbid obesity, T2DM, HTN, HLD, GERD, and CKD stage IV who presented to [place] on 2/15 with nonproductive cough, generalized myalgias and decreased exercise tolerance associated with poor appetite. In the ED she was afebrile, hypertensive, tachypneic with borderline oxygen saturations at rest. CBC unremarkable, CMP w/Cr 1.30, glucose 219, troponin undetectable, CRP 843 (ULN 10), d-dimer 1573 (ULN 500), PCT 0.09. ABG confirmed hypoxia with PaO2 57. CTA chest showed no PE, but did reveal patchy GGOs peripherally bilaterally. Remdesivir, decadron, and normal saline given in the ED, and the patient was admitted to [facility]. Hospitalization has been complicated by continued weakness, exertional hypoxia, and steroid-induced hyperglycemia.   Assessment & Plan: Principal Problem:   COVID-19 virus infection Active Problems:   Essential hypertension   Mild intermittent asthma, uncomplicated   Diabetes (HCC)   Diarrhea   CKD (chronic kidney disease), stage IV (HCC)  Acute hypoxemic respiratory failure due to covid-19 pneumonia: SARS-CoV-2 positive in [facility] on 2/15.  - Completed remdesivir x5 days 2/15 - 2/19. - Steroids x10 days, will taper as quickly as prudent due to hyperglycemia, still unable to taper with CRP elevation and hypoxemia. Recheck CRP and ambulatory pulse oximetry in AM. DC steroids and discharge to home if improved. - Vitamin C, zinc - Encourage OOB, IS as much as possible. Suspect atelectasis contributing. - Tylenol and antitussives prn - Continue airborne, contact precautions for 21 days from positive testing. - Check CBC w/diff, CMP, CRP daily  Asthma, allergic rhinitis:  - Continue prn albuterol, astelin, po antihistamine, added antitussives, continue singulair   AKI vs. stage IV CKD: Unclear. Will  clarify diagnosis based on creatinine trends during hospitalization. Based on improvement, AKI may have been more severe and CKD may be only stage IIIa.    - Gave LR 2/17. Creatinine stable. Continue to hold diuretics and IVF. Taking adequate po and no evidence of volume overload. - Monitor BMP, avoid nephrotoxins - Holding lisinopril   - Reports routine f/u w/nephrology scheduled March 3, which she may attend as virtual/telemedicine visit, but not in the office as it's within isolation period.  HTN: Better control since admission. BPs elevated but not severely. - Holding lisinopril and torsemide for now. Will give hydralazine prn  IDT2DM with steroid-induced hyperglycemia:  - Home daily dosing of insulin usually 30u total. We've augmented lantus aggressively, will continue with lantus up to 55u BID, increase mealtime to 20u TIDWC and continue resistant sliding scale, HS coverage.   - Linagliptin added - DC steroids as soon as hypoxia has resolved.  Morbid obesity: Estimated body mass index is 46.13 kg/m as calculated from the following:   Height as of this encounter: 5\' 4"  (1.626 m).   Weight as of this encounter: 121.9 kg.   Microscopic hematuria: 1+ occult blood reported. Hgb wnl. - Recheck at follow up, proceed with work up if remains.   Covid gastroenteritis: Resolved.  GERD:  - Continue PPI  Anxiety, insmonia:  - Continue home alprazolam  HLD:  - Continue statin  DVT prophylaxis: Lovenox 0.5mg /kg q24h  Code Status: Full Family Communication: None at bedside. No call requested. Disposition Plan: From home alone, still exertionally hypoxemic per PT note yesterday, but improving. The issue is that she still requires steroids and this is causing severe hyperglycemia, having received 162 units of insulin yesterday still  with glucose >200s. Will titrate insulin as above, continue steroids, PT, OT. Once she's no longer hypoxemic w/exertion, can stop steroids and DC. Consultants:     None  Procedures:   None  Antimicrobials:  Remdesivir 2/15 - 2/19  Subjective: Felt "pretty rough" this morning with shortnes of breath, weakness diffusely, cough, improving to only mild at this time. Sitting in chair, using IS frequently. Got up to bathroom last night with some shortness of breath albeit improved. No chest pain.   Objective: Vitals:   04/05/19 2000 04/06/19 0415 04/06/19 0722 04/06/19 0723  BP: (!) 139/52 (!) 147/76  (!) 148/71  Pulse: 84 69 60 81  Resp: 20 20  18   Temp: 97.9 F (36.6 C) (!) 97.4 F (36.3 C) 97.9 F (36.6 C)   TempSrc: Oral Oral Axillary   SpO2: 92% 94%  96%  Weight:      Height:        Intake/Output Summary (Last 24 hours) at 04/06/2019 0919 Last data filed at 04/05/2019 1700 Gross per 24 hour  Intake 240 ml  Output --  Net 240 ml   Filed Weights   04/01/19 0121  Weight: 121.9 kg   Gen: 74 y.o. female in no distress Pulm: Nonlabored but remains tachypneic even at rest, cough with deep inspiration which improves but does not resolve bilateral crackles. CV: Regular rate and rhythm. No murmur, rub, or gallop. No JVD, no dependent edema. GI: Abdomen soft, non-tender, non-distended, with normoactive bowel sounds.  Ext: Warm, no deformities Skin: No new rashes, lesions or ulcers on visualized skin. Neuro: Alert and oriented. No focal neurological deficits. Psych: Judgement and insight appear fair. Mood euthymic & affect congruent. Behavior is appropriate.    Data Reviewed: I have personally reviewed following labs and imaging studies  CBC: Recent Labs  Lab 04/01/19 0306 04/02/19 0223 04/03/19 0222 04/04/19 0107 04/05/19 0127  WBC 3.3* 6.7 6.0 5.8 6.1  NEUTROABS 2.4 5.0 4.8 4.6 4.8  HGB 13.9 14.0 13.4 13.2 13.7  HCT 43.7 42.9 41.1 40.4 41.5  MCV 97.5 96.4 97.4 95.7 94.7  PLT 135* 160 146* 166 280   Basic Metabolic Panel: Recent Labs  Lab 04/01/19 0306 04/01/19 0306 04/02/19 0223 04/03/19 0222 04/04/19 0107  04/05/19 0127 04/06/19 0257  NA 136   < > 137 135 138 136 139  K 4.0   < > 4.0 4.5 4.3 4.7 4.7  CL 100   < > 103 104 104 102 104  CO2 22   < > 22 20* 24 24 26   GLUCOSE 347*   < > 408* 373* 394* 390* 289*  BUN 33*   < > 48* 40* 40* 44* 48*  CREATININE 1.36*   < > 1.66* 1.43* 1.46* 1.28* 1.33*  CALCIUM 8.9   < > 9.3 8.9 9.2 9.5 9.7  MG 2.0  --   --   --   --   --   --    < > = values in this interval not displayed.   Liver Function Tests: Recent Labs  Lab 04/01/19 0306 04/02/19 0223 04/03/19 0222 04/04/19 0107 04/05/19 0127  AST 15 17 13* 13* 12*  ALT 17 15 15 15 16   ALKPHOS 71 74 67 76 77  BILITOT 0.8 0.5 0.5 0.6 0.5  PROT 6.5 6.2* 5.9* 6.1* 6.2*  ALBUMIN 3.2* 2.9* 2.7* 2.9* 2.8*   HbA1C: No results for input(s): HGBA1C in the last 72 hours. CBG: Recent Labs  Lab 04/02/19 0710 04/02/19 0349  04/03/19 0815 04/03/19 1117 04/05/19 1609  GLUCAP 424* 392* 330* 287* 269*   Scheduled Meds: . ALPRAZolam  0.25 mg Oral QHS  . aspirin EC  81 mg Oral Daily  . dexamethasone (DECADRON) injection  6 mg Intravenous Q24H  . enoxaparin (LOVENOX) injection  60 mg Subcutaneous Q24H  . insulin aspart  0-20 Units Subcutaneous TID WC  . insulin aspart  0-5 Units Subcutaneous QHS  . insulin aspart  20 Units Subcutaneous TID WC  . insulin glargine  55 Units Subcutaneous BID  . linagliptin  5 mg Oral Daily  . loratadine  10 mg Oral Daily  . montelukast  10 mg Oral QHS  . pantoprazole  40 mg Oral Daily  . rosuvastatin  20 mg Oral QHS  . sodium chloride flush  3 mL Intravenous Q12H  . vitamin B-12  1,000 mcg Oral QHS   Continuous Infusions: . sodium chloride       LOS: 6 days   Time spent: 35 minutes.  Patrecia Pour, MD [facility] [email or web address] 04/06/2019, 9:19 AM

## 2019-04-06 NOTE — Plan of Care (Signed)

## 2019-04-07 LAB — GLUCOSE, CAPILLARY
Glucose-Capillary: 282 mg/dL — ABNORMAL HIGH (ref 70–99)
Glucose-Capillary: 292 mg/dL — ABNORMAL HIGH (ref 70–99)

## 2019-04-07 LAB — C-REACTIVE PROTEIN: CRP: 1.2 mg/dL — ABNORMAL HIGH (ref <1.0)

## 2019-04-07 NOTE — Care Management Important Message (Signed)
Important Message  Patient Details  Name: SIMARA RHYNER MRN: [id number] Date of Birth: November 03, 1945   Medicare Important Message Given:  Yes - Important Message mailed due to current National Emergency  Verbal consent obtained due to current National Emergency  Relationship to patient: Grandchild Contact Name: [name] Call Date: 04/07/19  Time: 1115 Phone: [phone] Outcome: No Answer/Busy Important Message mailed to: Patient address on file    Delorse Lek 04/07/2019, 11:15 AM

## 2019-04-07 NOTE — Progress Notes (Signed)
Pt. Being discharged home; IV removed; discharge paperwork reviewed with patient; Elenor Legato removed; pt. Belongings returned to patient.

## 2019-04-07 NOTE — Progress Notes (Signed)
Inpatient Diabetes Program Recommendations  AACE/ADA: New Consensus Statement on Inpatient Glycemic Control   Target Ranges:  Prepandial:   less than 140 mg/dL      Peak postprandial:   less than 180 mg/dL (1-2 hours)      Critically ill patients:  140 - 180 mg/dL  Results for Lauren Webb, Lauren Webb (MRN [id number]) as of 04/07/2019 10:44  Ref. Range 04/07/2019 08:06  Glucose-Capillary Latest Ref Range: 70 - 99 mg/dL 282 (H)  Novolog 31 units  Lantus 55 units   Results for Lauren Webb, Lauren Webb (MRN [id number])   Ref. Range 04/06/2019 07:29 04/06/2019 11:36 04/06/2019 16:02 04/06/2019 1900  Glucose-Capillary Latest Ref Range: 70 - 99 mg/dL 211  Novolog 27 units  Lantus 55 units@9 :43 236  Novolog 27 units 146  Novolog 23 units 116   Review of Glycemic Control  Current orders for Inpatient glycemic control: Lantus 55 units BID, Novolog 20 units TID with meals, Novolog 0-20 units TID with meals, Novolog 0-5 units QHS, Tradjenta 5 mg daily  Inpatient Diabetes Program Recommendations:   Insulin: Noted Lantus was NOT GIVEN last night (charted as refused), fasting glucose 282 mg/dl this morning, and Decadron has been discontinued (last dose given at 16:10 on 04/06/19). May need to decrease insulin if patient experiences any hypoglycemia since steroids have been discontinued.  Thanks, Barnie Alderman, RN, MSN, CDE Diabetes Coordinator Inpatient Diabetes Program [phone] (Team Pager from 8am to 5pm)

## 2019-04-07 NOTE — Discharge Summary (Signed)
Physician Discharge Summary  Lauren Webb YKZ:[id number] DOB: [birth year] DOA: 03/31/2019  PCP: Ernestene Kiel, MD  Admit date: 03/31/2019 Discharge date: 04/07/2019  Admitted From: Home Disposition: Home   Recommendations for Outpatient Follow-up:  1. Follow up with PCP in 1-2 weeks, preferably sooner via telehealth as well for insulin management. 2. Please obtain CMP/CBC at follow up. 3. Recommend recheck urinalysis with microscopy to follow up microscopic hematuria.  Home Health: PT, OT, RN, aide Equipment/Devices: 3-in-1, tub bench/chair, and rolling walker Discharge Condition: Stable CODE STATUS: Full Diet recommendation: Heart healthy, carb-modified, renal  Brief/Interim Summary: Lauren Webb is a 74 y.o. female with a history of asthma, morbid obesity, T2DM, HTN, HLD, GERD, and CKD stage IV who presented to [place] on 2/15 with nonproductive cough, generalized myalgias and decreased exercise tolerance associated with poor appetite. In the ED she was afebrile, hypertensive, tachypneic with borderline oxygen saturations at rest. CBC unremarkable, CMP w/Cr 1.30, glucose 219, troponin undetectable, CRP 843 (ULN 10), d-dimer 1573 (ULN 500), PCT 0.09. ABG confirmed hypoxia with PaO2 57. CTA chest showed no PE, but did reveal patchy GGOs peripherally bilaterally. Remdesivir, decadron, and normal saline given in the ED, and the patient was admitted to [facility]. Hospitalization was complicated by continued weakness which has improved, exertional hypoxia which has resolved, and steroid-induced hyperglycemia which is improved and expected to resolve with discontinuation of steroids. She is familiar with sliding scale 70-30 insulin administration and will titrate insulin to glucose monitoring at home until PCP follow up.   Discharge Diagnoses:  Principal Problem:   COVID-19 virus infection Active Problems:   Essential hypertension   Mild intermittent asthma, uncomplicated   Diabetes (HCC)    Diarrhea   CKD (chronic kidney disease), stage IV (HCC)  Acute hypoxemic respiratory failure due to covid-19 pneumonia: SARS-CoV-2 positive in RH on 2/15.  - Completed remdesivir x5 days 2/15 - 2/19. - Received decadron x7 days. CRP down to 1.2 on day of discharge. Stop steroids due to no hypoxemia and severe hyperglycemia. - Continue airborne, contact precautions for 21 days from positive testing.  Asthma, allergic rhinitis:  - Continue prn albuterol, astelin, po antihistamine, added antitussives, continue singulair   AKI on stage IIIa CKD: Improved CrCl. Has history of stage IV CKD, though baseline CrCl appears to be between 45-54ml/min. - Gave LR 2/17. Creatinine stable. Able to restart diuretic now. - Monitor BMP, avoid nephrotoxins - Reports routine f/u w/nephrology scheduled March 3, which she may attend as virtual/telemedicine visit, but not in the office as it's within isolation period.  HTN: Better control since admission. BPs elevated but not severely. - Restart home medications  IDT2DM with steroid-induced hyperglycemia:  - Home daily dosing of insulin usually 30u total. We've augmented lantus aggressively, with lantus 55u BID, and mealtime novolog of 20u TIDWC + resistant sliding scale. Linagliptin added while admitted as well. Will restart home regimen at discharge since steroids have been stopped and HbA1c 7.9%. Patient aware of use of sliding scale pending PCP follow up and know PCP follow up is critically important for her via telehealth this week.  - DC steroids since hypoxemia resolved.  Morbid obesity: Estimated body mass index is 46.13 kg/m  Microscopic hematuria: 1+ occult blood reported. Hgb wnl. - Recheck at follow up, proceed with work up if remains.   Covid gastroenteritis: Resolved.  GERD:  - Continue PPI  Anxiety, insmonia:  - Continue home alprazolam  HLD:  - Continue statin  Discharge Instructions Discharge Instructions  Diet - low  sodium heart healthy   Complete by: As directed    Diet Carb Modified   Complete by: As directed    Discharge instructions   Complete by: As directed    You are being discharged from the hospital after treatment for covid-19 infection. You are felt to be stable enough to no longer require inpatient monitoring, testing, and treatment, though you will need to follow the recommendations below: - Continue taking medications as you were with particular attention to your blood sugars several times per day. Use the 70/30 insulin as you were directed on a sliding scale dose. Your blood sugars should improve as steroids have been stopped prior to discharge.  - Follow up with your PCP in the next couple days by phone/video, etc for advice on blood sugar control. You will also need to follow up with your nephrologist and have a repeat urinalysis sample due to some microscopic blood in the urine at admission. - Per CDC guidelines, you will need to remain in isolation for 21 days from your first positive covid test. - Do not take NSAID medications (including, but not limited to, ibuprofen, advil, motrin, naproxen, aleve, goody's powder, etc.) - Follow up with your doctor in the next week via telehealth or seek medical attention right away if your symptoms get WORSE.  - Consider donating plasma after you have recovered (either 14 days after a negative test or 28 days after symptoms have completely resolved) because your antibodies to this virus may be helpful to give to others with life-threatening infections. Please go to the website www.[email or web address] if you would like to consider volunteering for plasma donation.    Directions for you at home:  Wear a facemask You should wear a facemask that covers your nose and mouth when you are in the same room with other people and when you visit a healthcare provider. People who live with or visit you should also wear a facemask while they are in the same room with  you.  Separate yourself from other people in your home As much as possible, you should stay in a different room from other people in your home. Also, you should use a separate bathroom, if available.  Avoid sharing household items You should not share dishes, drinking glasses, cups, eating utensils, towels, bedding, or other items with other people in your home. After using these items, you should wash them thoroughly with soap and water.  Cover your coughs and sneezes Cover your mouth and nose with a tissue when you cough or sneeze, or you can cough or sneeze into your sleeve. Throw used tissues in a lined trash can, and immediately wash your hands with soap and water for at least 20 seconds or use an alcohol-based hand rub.  Wash your [facility] your hands often and thoroughly with soap and water for at least 20 seconds. You can use an alcohol-based hand sanitizer if soap and water are not available and if your hands are not visibly dirty. Avoid touching your eyes, nose, and mouth with unwashed hands.  Directions for those who live with, or provide care at home for you:  Limit the number of people who have contact with the patient If possible, have only one caregiver for the patient. Other household members should stay in another home or place of residence. If this is not possible, they should stay in another room, or be separated from the patient as much as possible. Use a separate bathroom,  if available. Restrict visitors who do not have an essential need to be in the home.  Ensure good ventilation Make sure that shared spaces in the home have good air flow, such as from an air conditioner or an opened window, weather permitting.  Wash your hands often Wash your hands often and thoroughly with soap and water for at least 20 seconds. You can use an alcohol based hand sanitizer if soap and water are not available and if your hands are not visibly dirty. Avoid touching your eyes,  nose, and mouth with unwashed hands. Use disposable paper towels to dry your hands. If not available, use dedicated cloth towels and replace them when they become wet.  Wear a facemask and gloves Wear a disposable facemask at all times in the room and gloves when you touch or have contact with the patient's blood, body fluids, and/or secretions or excretions, such as sweat, saliva, sputum, nasal mucus, vomit, urine, or feces.  Ensure the mask fits over your nose and mouth tightly, and do not touch it during use. Throw out disposable facemasks and gloves after using them. Do not reuse. Wash your hands immediately after removing your facemask and gloves. If your personal clothing becomes contaminated, carefully remove clothing and launder. Wash your hands after handling contaminated clothing. Place all used disposable facemasks, gloves, and other waste in a lined container before disposing them with other household waste. Remove gloves and wash your hands immediately after handling these items.  Do not share dishes, glasses, or other household items with the patient Avoid sharing household items. You should not share dishes, drinking glasses, cups, eating utensils, towels, bedding, or other items with a patient who is confirmed to have, or being evaluated for, COVID-19 infection. After the person uses these items, you should wash them thoroughly with soap and water.  Wash laundry thoroughly Immediately remove and wash clothes or bedding that have blood, body fluids, and/or secretions or excretions, such as sweat, saliva, sputum, nasal mucus, vomit, urine, or feces, on them. Wear gloves when handling laundry from the patient. Read and follow directions on labels of laundry or clothing items and detergent. In general, wash and dry with the warmest temperatures recommended on the label.  Clean all areas the individual has used often Clean all touchable surfaces, such as counters, tabletops,  doorknobs, bathroom fixtures, toilets, phones, keyboards, tablets, and bedside tables, every day. Also, clean any surfaces that may have blood, body fluids, and/or secretions or excretions on them. Wear gloves when cleaning surfaces the patient has come in contact with. Use a diluted bleach solution (e.g., dilute bleach with 1 part bleach and 10 parts water) or a household disinfectant with a label that says EPA-registered for coronaviruses. To make a bleach solution at home, add 1 tablespoon of bleach to 1 quart (4 cups) of water. For a larger supply, add  cup of bleach to 1 gallon (16 cups) of water. Read labels of cleaning products and follow recommendations provided on product labels. Labels contain instructions for safe and effective use of the cleaning product including precautions you should take when applying the product, such as wearing gloves or eye protection and making sure you have good ventilation during use of the product. Remove gloves and wash hands immediately after cleaning.  Monitor yourself for signs and symptoms of illness Caregivers and household members are considered close contacts, should monitor their health, and will be asked to limit movement outside of the home to the extent possible. Follow  the monitoring steps for close contacts listed on the symptom monitoring form.  If you have additional questions, contact your local health department or call the epidemiologist on call at [phone] (available 24/7). This guidance is subject to change. For the most up-to-date guidance from [facility], please refer to their website: [email or web address]   Increase activity slowly   Complete by: As directed    MyChart COVID-19 home monitoring program   Complete by: Apr 07, 2019    Is the patient willing to use the [facility] for home monitoring?: Yes     Allergies as of 04/07/2019      Reactions   Avelox [moxifloxacin Hcl  In Nacl] Other (See Comments)   weakness   Moxifloxacin Other (See Comments)   weakness   Nsaids    "Due to Kidney"   Other Other (See Comments)   "Due to Kidney" "Due to Kidney"      Medication List    TAKE these medications   ALPRAZolam 0.25 MG tablet Commonly known as: XANAX Take 0.25 mg by mouth at bedtime.   aspirin EC 81 MG tablet Take 81 mg by mouth daily.   azelastine 0.1 % nasal spray Commonly known as: ASTELIN Use two sprays in each nostril twice daily as needed for runny nose. What changed:   how much to take  how to take this  when to take this  reasons to take this  additional instructions   B-D UF III MINI PEN NEEDLES 31G X 5 MM Misc Generic drug: Insulin Pen Needle USE WITH INSULIN TWICE A DAY   cetirizine 10 MG tablet Commonly known as: ZyrTEC Allergy Take 1 tablet (10 mg total) by mouth daily.   glipiZIDE 10 MG tablet Commonly known as: GLUCOTROL Take 10 mg by mouth 2 (two) times daily as needed (high blood sugar).   HumuLIN 70/30 KwikPen (70-30) 100 UNIT/ML PEN Generic drug: Insulin Isophane & Regular Human Inject 12-20 Units into the skin 2 (two) times daily with a meal.   HYDROcodone-acetaminophen 5-325 MG tablet Commonly known as: NORCO/VICODIN Take 1 tablet by mouth daily as needed for moderate pain.   lisinopril 20 MG tablet Commonly known as: ZESTRIL Take 10 mg by mouth daily.   montelukast 10 MG tablet Commonly known as: SINGULAIR TAKE 1 TABLET BY MOUTH EVERYDAY AT BEDTIME What changed: See the new instructions.   omeprazole 40 MG capsule Commonly known as: PRILOSEC Take 40 mg by mouth at bedtime.   ONE TOUCH ULTRA TEST test strip Generic drug: glucose blood USE AS DIRECTED 4 TIMES A DAY   oxymetazoline 0.05 % nasal spray Commonly known as: AFRIN Place 1 spray into both nostrils 2 (two) times daily. For 3-5 days What changed:   when to take this  reasons to take this  additional instructions   rosuvastatin 40  MG tablet Commonly known as: CRESTOR Take 20 mg by mouth at bedtime.   torsemide 10 MG tablet Commonly known as: DEMADEX Take 10 mg by mouth daily.   VITAMIN B-12 PO Take 1 tablet by mouth at bedtime.            Durable Medical Equipment  (From admission, onward)         Start     Ordered   04/04/19 0728  For home use only DME Walker rolling  Once    Question Answer Comment  Walker: With 5 Inch Wheels   Patient needs a walker to treat with the following condition Gait instability  04/04/19 0727   04/04/19 0728  For home use only DME Tub bench  Once     04/04/19 3825   04/04/19 0728  For home use only DME 3 n 1  Once     04/04/19 0727         Follow-up Information    [facility].   Contact information: [address] [place] [address] [phone]        Ernestene Kiel, MD. Schedule an appointment as soon as possible for a visit in 1 day(s).   Specialty: Internal Medicine Contact information: [address] [place] [address] [phone]          Allergies  Allergen Reactions  . Avelox [Moxifloxacin Hcl In Nacl] Other (See Comments)    weakness  . Moxifloxacin Other (See Comments)    weakness  . Nsaids     "Due to Kidney"  . Other Other (See Comments)    "Due to Kidney" "Due to Kidney"    Consultations:  None  Procedures/Studies: No results found.  Subjective: Mild headache remains, no chest pain or other new complaints. Has improved exertional shortness of breath and better exercise capacity, ambulating without supplemental oxygen and maintaining SpO2 >89%.   Discharge Exam: Vitals:   04/07/19 0500 04/07/19 0747  BP: (!) 144/70 (!) 157/70  Pulse: 65 65  Resp: 19 18  Temp: 97.9 F (36.6 C) 97.7 F (36.5 C)  SpO2: 92% 92%   General: Pleasant elderly female in no distress Cardiovascular: RRR, S1/S2 +, no rubs, no gallops Respiratory: Nonlabored  with improved crackles mostly in mid/lower lung zones bilaterally. No wheezes. Abdominal: Soft, NT, ND, bowel sounds + Extremities: No pitting edema, no cyanosis  Labs: Basic Metabolic Panel: Recent Labs  Lab 04/01/19 0306 04/01/19 0306 04/02/19 0223 04/03/19 0222 04/04/19 0107 04/05/19 0127 04/06/19 0257  NA 136   < > 137 135 138 136 139  K 4.0   < > 4.0 4.5 4.3 4.7 4.7  CL 100   < > 103 104 104 102 104  CO2 22   < > 22 20* 24 24 26   GLUCOSE 347*   < > 408* 373* 394* 390* 289*  BUN 33*   < > 48* 40* 40* 44* 48*  CREATININE 1.36*   < > 1.66* 1.43* 1.46* 1.28* 1.33*  CALCIUM 8.9   < > 9.3 8.9 9.2 9.5 9.7  MG 2.0  --   --   --   --   --   --    < > = values in this interval not displayed.   Liver Function Tests: Recent Labs  Lab 04/01/19 0306 04/02/19 0223 04/03/19 0222 04/04/19 0107 04/05/19 0127  AST 15 17 13* 13* 12*  ALT 17 15 15 15 16   ALKPHOS 71 74 67 76 77  BILITOT 0.8 0.5 0.5 0.6 0.5  PROT 6.5 6.2* 5.9* 6.1* 6.2*  ALBUMIN 3.2* 2.9* 2.7* 2.9* 2.8*   CBC: Recent Labs  Lab 04/01/19 0306 04/02/19 0223 04/03/19 0222 04/04/19 0107 04/05/19 0127  WBC 3.3* 6.7 6.0 5.8 6.1  NEUTROABS 2.4 5.0 4.8 4.6 4.8  HGB 13.9 14.0 13.4 13.2 13.7  HCT 43.7 42.9 41.1 40.4 41.5  MCV 97.5 96.4 97.4 95.7 94.7  PLT 135* 160 146* 166 203   CBG: Recent Labs  Lab 04/02/19 0748 04/03/19 0815 04/03/19 1117 04/05/19 1609 04/07/19 0806  GLUCAP 392* 330* 287* 269* 282*  D-Dimer Recent Labs    04/05/19 0127  DDIMER 1.48*    Time coordinating discharge: Approximately 40 minutes  Patrecia Pour, MD  [facility] 04/07/2019, 9:24 AM

## 2019-04-07 NOTE — Progress Notes (Signed)
PT Cancellation Note  Patient Details Name: Lauren Webb MRN: [id number] DOB: 05-14-1945   Cancelled Treatment:    Reason Eval/Treat Not Completed: Patient declined, no reason specified;Other (comment)(Pt states she's about to leave and has no further questions)   Ann Held PT, DPT Acute [place] P: [place] 04/07/2019, 10:30 AM

## 2019-04-07 NOTE — Progress Notes (Signed)
Occupational Therapy Treatment Patient Details Name: Lauren Webb MRN: [id number] DOB: [birth year] Today's Date: 04/07/2019    History of present illness 74 year old female admitted 03/31/19 with progressively worse aches, malaise, nausea, diarrhea over 1 week. She noted decreased exercise tolerance.  In the ED, oxygen saturation in the low 90s on room air.  CTA chest: no evidence of PE but did note patchy groundglass densities bilaterally. PMH: asthma, CKD, GERD, DM 2, HTN, and HLD    OT comments  Pt making progress in therapy, demonstrating improved balance and activity tolerance. Pt able to ambulate to/from bathroom with 4WW and supervision. Noted 0 instances of loss of balance. Pt completed toileting task with modified independence. Pt tolerated standing 1 x 6 min at the sink to complete grooming and sponge bathing tasks. Pt on room air with SpO2 maintaining in 90s throughout with pt reporting min shortness of breath. Educated and provided pt with handout in regards to energy conservation. Educated pt on fall prevention strategies with fair understanding. Educated/instructed pt on BUE level I theraband HEP with pt requiring min to mod cues and demo on technique. OT will continue to follow acutely.    Follow Up Recommendations  Home health OT;Supervision - Intermittent    Equipment Recommendations  3 in 1 bedside commode;Tub/shower bench;Other (comment)(home health aide)    Recommendations for Other Services      Precautions / Restrictions Precautions Precautions: Fall Restrictions Weight Bearing Restrictions: No       Mobility Bed Mobility Overal bed mobility: Modified Independent             General bed mobility comments: HOB elevated, use of bedrail  Transfers Overall transfer level: Modified independent Equipment used: 4-wheeled walker Transfers: Sit to/from Stand Sit to Stand: Modified independent (Device/Increase time)              Balance Overall balance  assessment: Mild deficits observed, not formally tested                                         ADL either performed or assessed with clinical judgement   ADL Overall ADL's : Needs assistance/impaired     Grooming: Set up;Supervision/safety;Standing   Upper Body Bathing: Set up;Supervision/ safety;Standing   Lower Body Bathing: Set up;Supervison/ safety;Sit to/from stand       Lower Body Dressing: Supervision/safety;Sitting/lateral leans   Toilet Transfer: Modified Independent   Toileting- Clothing Manipulation and Hygiene: Modified independent       Functional mobility during ADLs: Supervision/safety;Rolling walker General ADL Comments: Pt able to ambulate to/from bathroom with 4WW and supervision. Noted 0 instances of LOB.      Vision       Perception     Praxis      Cognition Arousal/Alertness: Awake/alert Behavior During Therapy: WFL for tasks assessed/performed Overall Cognitive Status: No family/caregiver present to determine baseline cognitive functioning                                          Exercises Exercises: Other exercises Other Exercises Other Exercises: BUE level 1 theraband HEP with min to mod cues and demo on technique.    Shoulder Instructions       General Comments Pt on room air with SpO2 maintaining in 90s throughout.  Pertinent Vitals/ Pain       Pain Assessment: No/denies pain  Home Living                                          Prior Functioning/Environment              Frequency           Progress Toward Goals  OT Goals(current goals can now be found in the care plan section)  Progress towards OT goals: Progressing toward goals  ADL Goals Pt Will Perform Grooming: with modified independence Pt Will Perform Lower Body Dressing: with modified independence Pt Will Transfer to Toilet: with modified independence Pt/caregiver will Perform Home Exercise  Program: Increased strength;With written HEP provided;Both right and left upper extremity;Independently Additional ADL Goal #1: Patient will verbalize 3 energy conservation techniques to incorporate in self-care tasks.  Plan Discharge plan remains appropriate    Co-evaluation                 AM-PAC OT "6 Clicks" Daily Activity     Outcome Measure   Help from another person eating meals?: None Help from another person taking care of personal grooming?: None Help from another person toileting, which includes using toliet, bedpan, or urinal?: None Help from another person bathing (including washing, rinsing, drying)?: A Little Help from another person to put on and taking off regular upper body clothing?: A Little Help from another person to put on and taking off regular lower body clothing?: A Little 6 Click Score: 21    End of Session Equipment Utilized During Treatment: Rolling walker  OT Visit Diagnosis: Unsteadiness on feet (R26.81);Muscle weakness (generalized) (M62.81)   Activity Tolerance Patient tolerated treatment well   Patient Left with call bell/phone within reach;in bed   Nurse Communication Mobility status        Time: 6144-3154 OT Time Calculation (min): 32 min  Charges: OT General Charges $OT Visit: 1 Visit OT Treatments $Self Care/Home Management : 8-22 mins $Therapeutic Activity: 8-22 mins  Mauri Brooklyn OTR/L [phone]   Mauri Brooklyn 04/07/2019, 11:18 AM

## 2019-04-07 NOTE — TOC Progression Note (Signed)
Transition of Care [facility]) - Progression Note    Patient Details  Name: Lauren Webb MRN: [id number] Date of Birth: [birth year]  [facility] CM/SW Contact  Joaquin Courts, RN Phone Number: 04/07/2019, 9:42 AM  Clinical Narrative:  Huey Romans to deliver rolling walker, tub bench, and 3in1 to patient's home. Patient reports her brother will pick her up from hospital at dc.       Expected Discharge Plan: [place] Barriers to Discharge: No Barriers Identified  Expected Discharge Plan and Services Expected Discharge Plan: [place]   Discharge Planning Services: CM Consult Post Acute Care Choice: [place] arrangements for the past 2 months: Single Family Home Expected Discharge Date: 04/07/19               DME Arranged: Gilford Rile rolling, 3-N-1, Tub bench DME Agency: [place] Date DME Agency Contacted: 04/07/19 Time DME Agency Contacted: [phone] Representative spoke with at DME Agency: [place]: PT [place]: [place] Date [place]: 04/04/19 Time [place]: [place] Representative spoke with at [place]: [place] ([place]) Interventions    Readmission Risk Interventions No flowsheet data found.

## 2019-04-09 DIAGNOSIS — G4700 Insomnia, unspecified: Secondary | ICD-10-CM | POA: Diagnosis not present

## 2019-04-09 DIAGNOSIS — K219 Gastro-esophageal reflux disease without esophagitis: Secondary | ICD-10-CM | POA: Diagnosis not present

## 2019-04-09 DIAGNOSIS — I129 Hypertensive chronic kidney disease with stage 1 through stage 4 chronic kidney disease, or unspecified chronic kidney disease: Secondary | ICD-10-CM | POA: Diagnosis not present

## 2019-04-09 DIAGNOSIS — E785 Hyperlipidemia, unspecified: Secondary | ICD-10-CM | POA: Diagnosis not present

## 2019-04-09 DIAGNOSIS — J4520 Mild intermittent asthma, uncomplicated: Secondary | ICD-10-CM | POA: Diagnosis not present

## 2019-04-09 DIAGNOSIS — T380X5D Adverse effect of glucocorticoids and synthetic analogues, subsequent encounter: Secondary | ICD-10-CM | POA: Diagnosis not present

## 2019-04-09 DIAGNOSIS — E1165 Type 2 diabetes mellitus with hyperglycemia: Secondary | ICD-10-CM | POA: Diagnosis not present

## 2019-04-09 DIAGNOSIS — E1122 Type 2 diabetes mellitus with diabetic chronic kidney disease: Secondary | ICD-10-CM | POA: Diagnosis not present

## 2019-04-09 DIAGNOSIS — N184 Chronic kidney disease, stage 4 (severe): Secondary | ICD-10-CM | POA: Diagnosis not present

## 2019-04-09 DIAGNOSIS — Z794 Long term (current) use of insulin: Secondary | ICD-10-CM | POA: Diagnosis not present

## 2019-04-10 DIAGNOSIS — E785 Hyperlipidemia, unspecified: Secondary | ICD-10-CM | POA: Diagnosis not present

## 2019-04-10 DIAGNOSIS — E1165 Type 2 diabetes mellitus with hyperglycemia: Secondary | ICD-10-CM | POA: Diagnosis not present

## 2019-04-10 DIAGNOSIS — T380X5D Adverse effect of glucocorticoids and synthetic analogues, subsequent encounter: Secondary | ICD-10-CM | POA: Diagnosis not present

## 2019-04-10 DIAGNOSIS — J4520 Mild intermittent asthma, uncomplicated: Secondary | ICD-10-CM | POA: Diagnosis not present

## 2019-04-10 DIAGNOSIS — E1122 Type 2 diabetes mellitus with diabetic chronic kidney disease: Secondary | ICD-10-CM | POA: Diagnosis not present

## 2019-04-10 DIAGNOSIS — I129 Hypertensive chronic kidney disease with stage 1 through stage 4 chronic kidney disease, or unspecified chronic kidney disease: Secondary | ICD-10-CM | POA: Diagnosis not present

## 2019-04-10 DIAGNOSIS — K219 Gastro-esophageal reflux disease without esophagitis: Secondary | ICD-10-CM | POA: Diagnosis not present

## 2019-04-10 DIAGNOSIS — N184 Chronic kidney disease, stage 4 (severe): Secondary | ICD-10-CM | POA: Diagnosis not present

## 2019-04-10 DIAGNOSIS — Z794 Long term (current) use of insulin: Secondary | ICD-10-CM | POA: Diagnosis not present

## 2019-04-10 DIAGNOSIS — G4700 Insomnia, unspecified: Secondary | ICD-10-CM | POA: Diagnosis not present

## 2019-04-14 DIAGNOSIS — I129 Hypertensive chronic kidney disease with stage 1 through stage 4 chronic kidney disease, or unspecified chronic kidney disease: Secondary | ICD-10-CM | POA: Diagnosis not present

## 2019-04-14 DIAGNOSIS — N184 Chronic kidney disease, stage 4 (severe): Secondary | ICD-10-CM | POA: Diagnosis not present

## 2019-04-14 DIAGNOSIS — E785 Hyperlipidemia, unspecified: Secondary | ICD-10-CM | POA: Diagnosis not present

## 2019-04-14 DIAGNOSIS — J4520 Mild intermittent asthma, uncomplicated: Secondary | ICD-10-CM | POA: Diagnosis not present

## 2019-04-14 DIAGNOSIS — T380X5D Adverse effect of glucocorticoids and synthetic analogues, subsequent encounter: Secondary | ICD-10-CM | POA: Diagnosis not present

## 2019-04-14 DIAGNOSIS — K219 Gastro-esophageal reflux disease without esophagitis: Secondary | ICD-10-CM | POA: Diagnosis not present

## 2019-04-14 DIAGNOSIS — Z794 Long term (current) use of insulin: Secondary | ICD-10-CM | POA: Diagnosis not present

## 2019-04-14 DIAGNOSIS — E1165 Type 2 diabetes mellitus with hyperglycemia: Secondary | ICD-10-CM | POA: Diagnosis not present

## 2019-04-14 DIAGNOSIS — E1122 Type 2 diabetes mellitus with diabetic chronic kidney disease: Secondary | ICD-10-CM | POA: Diagnosis not present

## 2019-04-14 DIAGNOSIS — G4700 Insomnia, unspecified: Secondary | ICD-10-CM | POA: Diagnosis not present

## 2019-04-15 DIAGNOSIS — G4700 Insomnia, unspecified: Secondary | ICD-10-CM | POA: Diagnosis not present

## 2019-04-15 DIAGNOSIS — E785 Hyperlipidemia, unspecified: Secondary | ICD-10-CM | POA: Diagnosis not present

## 2019-04-15 DIAGNOSIS — I129 Hypertensive chronic kidney disease with stage 1 through stage 4 chronic kidney disease, or unspecified chronic kidney disease: Secondary | ICD-10-CM | POA: Diagnosis not present

## 2019-04-15 DIAGNOSIS — J4520 Mild intermittent asthma, uncomplicated: Secondary | ICD-10-CM | POA: Diagnosis not present

## 2019-04-15 DIAGNOSIS — N184 Chronic kidney disease, stage 4 (severe): Secondary | ICD-10-CM | POA: Diagnosis not present

## 2019-04-15 DIAGNOSIS — K219 Gastro-esophageal reflux disease without esophagitis: Secondary | ICD-10-CM | POA: Diagnosis not present

## 2019-04-15 DIAGNOSIS — T380X5D Adverse effect of glucocorticoids and synthetic analogues, subsequent encounter: Secondary | ICD-10-CM | POA: Diagnosis not present

## 2019-04-15 DIAGNOSIS — E1165 Type 2 diabetes mellitus with hyperglycemia: Secondary | ICD-10-CM | POA: Diagnosis not present

## 2019-04-15 DIAGNOSIS — E1122 Type 2 diabetes mellitus with diabetic chronic kidney disease: Secondary | ICD-10-CM | POA: Diagnosis not present

## 2019-04-15 DIAGNOSIS — Z794 Long term (current) use of insulin: Secondary | ICD-10-CM | POA: Diagnosis not present

## 2019-04-16 DIAGNOSIS — E1122 Type 2 diabetes mellitus with diabetic chronic kidney disease: Secondary | ICD-10-CM | POA: Diagnosis not present

## 2019-04-16 DIAGNOSIS — J4520 Mild intermittent asthma, uncomplicated: Secondary | ICD-10-CM | POA: Diagnosis not present

## 2019-04-16 DIAGNOSIS — Z794 Long term (current) use of insulin: Secondary | ICD-10-CM | POA: Diagnosis not present

## 2019-04-16 DIAGNOSIS — T380X5D Adverse effect of glucocorticoids and synthetic analogues, subsequent encounter: Secondary | ICD-10-CM | POA: Diagnosis not present

## 2019-04-16 DIAGNOSIS — E1165 Type 2 diabetes mellitus with hyperglycemia: Secondary | ICD-10-CM | POA: Diagnosis not present

## 2019-04-16 DIAGNOSIS — K219 Gastro-esophageal reflux disease without esophagitis: Secondary | ICD-10-CM | POA: Diagnosis not present

## 2019-04-16 DIAGNOSIS — G4700 Insomnia, unspecified: Secondary | ICD-10-CM | POA: Diagnosis not present

## 2019-04-16 DIAGNOSIS — N184 Chronic kidney disease, stage 4 (severe): Secondary | ICD-10-CM | POA: Diagnosis not present

## 2019-04-16 DIAGNOSIS — E785 Hyperlipidemia, unspecified: Secondary | ICD-10-CM | POA: Diagnosis not present

## 2019-04-16 DIAGNOSIS — I129 Hypertensive chronic kidney disease with stage 1 through stage 4 chronic kidney disease, or unspecified chronic kidney disease: Secondary | ICD-10-CM | POA: Diagnosis not present

## 2019-04-17 DIAGNOSIS — Z794 Long term (current) use of insulin: Secondary | ICD-10-CM | POA: Diagnosis not present

## 2019-04-17 DIAGNOSIS — G4700 Insomnia, unspecified: Secondary | ICD-10-CM | POA: Diagnosis not present

## 2019-04-17 DIAGNOSIS — T380X5D Adverse effect of glucocorticoids and synthetic analogues, subsequent encounter: Secondary | ICD-10-CM | POA: Diagnosis not present

## 2019-04-17 DIAGNOSIS — I129 Hypertensive chronic kidney disease with stage 1 through stage 4 chronic kidney disease, or unspecified chronic kidney disease: Secondary | ICD-10-CM | POA: Diagnosis not present

## 2019-04-17 DIAGNOSIS — E785 Hyperlipidemia, unspecified: Secondary | ICD-10-CM | POA: Diagnosis not present

## 2019-04-17 DIAGNOSIS — K219 Gastro-esophageal reflux disease without esophagitis: Secondary | ICD-10-CM | POA: Diagnosis not present

## 2019-04-17 DIAGNOSIS — E1165 Type 2 diabetes mellitus with hyperglycemia: Secondary | ICD-10-CM | POA: Diagnosis not present

## 2019-04-17 DIAGNOSIS — N184 Chronic kidney disease, stage 4 (severe): Secondary | ICD-10-CM | POA: Diagnosis not present

## 2019-04-17 DIAGNOSIS — E1122 Type 2 diabetes mellitus with diabetic chronic kidney disease: Secondary | ICD-10-CM | POA: Diagnosis not present

## 2019-04-17 DIAGNOSIS — J4520 Mild intermittent asthma, uncomplicated: Secondary | ICD-10-CM | POA: Diagnosis not present

## 2019-04-21 DIAGNOSIS — E1122 Type 2 diabetes mellitus with diabetic chronic kidney disease: Secondary | ICD-10-CM | POA: Diagnosis not present

## 2019-04-21 DIAGNOSIS — J4520 Mild intermittent asthma, uncomplicated: Secondary | ICD-10-CM | POA: Diagnosis not present

## 2019-04-21 DIAGNOSIS — N184 Chronic kidney disease, stage 4 (severe): Secondary | ICD-10-CM | POA: Diagnosis not present

## 2019-04-21 DIAGNOSIS — Z794 Long term (current) use of insulin: Secondary | ICD-10-CM | POA: Diagnosis not present

## 2019-04-21 DIAGNOSIS — K219 Gastro-esophageal reflux disease without esophagitis: Secondary | ICD-10-CM | POA: Diagnosis not present

## 2019-04-21 DIAGNOSIS — I129 Hypertensive chronic kidney disease with stage 1 through stage 4 chronic kidney disease, or unspecified chronic kidney disease: Secondary | ICD-10-CM | POA: Diagnosis not present

## 2019-04-21 DIAGNOSIS — G4700 Insomnia, unspecified: Secondary | ICD-10-CM | POA: Diagnosis not present

## 2019-04-21 DIAGNOSIS — T380X5D Adverse effect of glucocorticoids and synthetic analogues, subsequent encounter: Secondary | ICD-10-CM | POA: Diagnosis not present

## 2019-04-21 DIAGNOSIS — E785 Hyperlipidemia, unspecified: Secondary | ICD-10-CM | POA: Diagnosis not present

## 2019-04-21 DIAGNOSIS — E1165 Type 2 diabetes mellitus with hyperglycemia: Secondary | ICD-10-CM | POA: Diagnosis not present

## 2019-04-23 DIAGNOSIS — N184 Chronic kidney disease, stage 4 (severe): Secondary | ICD-10-CM | POA: Diagnosis not present

## 2019-04-23 DIAGNOSIS — E1122 Type 2 diabetes mellitus with diabetic chronic kidney disease: Secondary | ICD-10-CM | POA: Diagnosis not present

## 2019-04-23 DIAGNOSIS — E785 Hyperlipidemia, unspecified: Secondary | ICD-10-CM | POA: Diagnosis not present

## 2019-04-23 DIAGNOSIS — I129 Hypertensive chronic kidney disease with stage 1 through stage 4 chronic kidney disease, or unspecified chronic kidney disease: Secondary | ICD-10-CM | POA: Diagnosis not present

## 2019-04-23 DIAGNOSIS — G4700 Insomnia, unspecified: Secondary | ICD-10-CM | POA: Diagnosis not present

## 2019-04-23 DIAGNOSIS — Z794 Long term (current) use of insulin: Secondary | ICD-10-CM | POA: Diagnosis not present

## 2019-04-23 DIAGNOSIS — K219 Gastro-esophageal reflux disease without esophagitis: Secondary | ICD-10-CM | POA: Diagnosis not present

## 2019-04-23 DIAGNOSIS — T380X5D Adverse effect of glucocorticoids and synthetic analogues, subsequent encounter: Secondary | ICD-10-CM | POA: Diagnosis not present

## 2019-04-23 DIAGNOSIS — E1165 Type 2 diabetes mellitus with hyperglycemia: Secondary | ICD-10-CM | POA: Diagnosis not present

## 2019-04-23 DIAGNOSIS — J4520 Mild intermittent asthma, uncomplicated: Secondary | ICD-10-CM | POA: Diagnosis not present

## 2019-04-24 DIAGNOSIS — T380X5D Adverse effect of glucocorticoids and synthetic analogues, subsequent encounter: Secondary | ICD-10-CM | POA: Diagnosis not present

## 2019-04-24 DIAGNOSIS — N184 Chronic kidney disease, stage 4 (severe): Secondary | ICD-10-CM | POA: Diagnosis not present

## 2019-04-24 DIAGNOSIS — Z794 Long term (current) use of insulin: Secondary | ICD-10-CM | POA: Diagnosis not present

## 2019-04-24 DIAGNOSIS — K219 Gastro-esophageal reflux disease without esophagitis: Secondary | ICD-10-CM | POA: Diagnosis not present

## 2019-04-24 DIAGNOSIS — G4700 Insomnia, unspecified: Secondary | ICD-10-CM | POA: Diagnosis not present

## 2019-04-24 DIAGNOSIS — E785 Hyperlipidemia, unspecified: Secondary | ICD-10-CM | POA: Diagnosis not present

## 2019-04-24 DIAGNOSIS — I129 Hypertensive chronic kidney disease with stage 1 through stage 4 chronic kidney disease, or unspecified chronic kidney disease: Secondary | ICD-10-CM | POA: Diagnosis not present

## 2019-04-24 DIAGNOSIS — J4520 Mild intermittent asthma, uncomplicated: Secondary | ICD-10-CM | POA: Diagnosis not present

## 2019-04-24 DIAGNOSIS — E1122 Type 2 diabetes mellitus with diabetic chronic kidney disease: Secondary | ICD-10-CM | POA: Diagnosis not present

## 2019-04-24 DIAGNOSIS — E1165 Type 2 diabetes mellitus with hyperglycemia: Secondary | ICD-10-CM | POA: Diagnosis not present

## 2019-04-30 DIAGNOSIS — E1165 Type 2 diabetes mellitus with hyperglycemia: Secondary | ICD-10-CM | POA: Diagnosis not present

## 2019-04-30 DIAGNOSIS — Z794 Long term (current) use of insulin: Secondary | ICD-10-CM | POA: Diagnosis not present

## 2019-04-30 DIAGNOSIS — J4520 Mild intermittent asthma, uncomplicated: Secondary | ICD-10-CM | POA: Diagnosis not present

## 2019-04-30 DIAGNOSIS — T380X5D Adverse effect of glucocorticoids and synthetic analogues, subsequent encounter: Secondary | ICD-10-CM | POA: Diagnosis not present

## 2019-04-30 DIAGNOSIS — I129 Hypertensive chronic kidney disease with stage 1 through stage 4 chronic kidney disease, or unspecified chronic kidney disease: Secondary | ICD-10-CM | POA: Diagnosis not present

## 2019-04-30 DIAGNOSIS — N184 Chronic kidney disease, stage 4 (severe): Secondary | ICD-10-CM | POA: Diagnosis not present

## 2019-04-30 DIAGNOSIS — K219 Gastro-esophageal reflux disease without esophagitis: Secondary | ICD-10-CM | POA: Diagnosis not present

## 2019-04-30 DIAGNOSIS — G4733 Obstructive sleep apnea (adult) (pediatric): Secondary | ICD-10-CM | POA: Diagnosis not present

## 2019-04-30 DIAGNOSIS — G4700 Insomnia, unspecified: Secondary | ICD-10-CM | POA: Diagnosis not present

## 2019-04-30 DIAGNOSIS — E785 Hyperlipidemia, unspecified: Secondary | ICD-10-CM | POA: Diagnosis not present

## 2019-04-30 DIAGNOSIS — E1122 Type 2 diabetes mellitus with diabetic chronic kidney disease: Secondary | ICD-10-CM | POA: Diagnosis not present

## 2019-05-01 ENCOUNTER — Other Ambulatory Visit: Payer: Self-pay

## 2019-05-05 DIAGNOSIS — Z79899 Other long term (current) drug therapy: Secondary | ICD-10-CM | POA: Diagnosis not present

## 2019-05-05 DIAGNOSIS — R05 Cough: Secondary | ICD-10-CM | POA: Diagnosis not present

## 2019-05-05 DIAGNOSIS — G933 Postviral fatigue syndrome: Secondary | ICD-10-CM | POA: Diagnosis not present

## 2019-05-05 DIAGNOSIS — J0100 Acute maxillary sinusitis, unspecified: Secondary | ICD-10-CM | POA: Diagnosis not present

## 2019-05-06 DIAGNOSIS — E1165 Type 2 diabetes mellitus with hyperglycemia: Secondary | ICD-10-CM | POA: Diagnosis not present

## 2019-05-06 DIAGNOSIS — T380X5D Adverse effect of glucocorticoids and synthetic analogues, subsequent encounter: Secondary | ICD-10-CM | POA: Diagnosis not present

## 2019-05-06 DIAGNOSIS — E785 Hyperlipidemia, unspecified: Secondary | ICD-10-CM | POA: Diagnosis not present

## 2019-05-06 DIAGNOSIS — I129 Hypertensive chronic kidney disease with stage 1 through stage 4 chronic kidney disease, or unspecified chronic kidney disease: Secondary | ICD-10-CM | POA: Diagnosis not present

## 2019-05-06 DIAGNOSIS — J4520 Mild intermittent asthma, uncomplicated: Secondary | ICD-10-CM | POA: Diagnosis not present

## 2019-05-06 DIAGNOSIS — E1122 Type 2 diabetes mellitus with diabetic chronic kidney disease: Secondary | ICD-10-CM | POA: Diagnosis not present

## 2019-05-06 DIAGNOSIS — N184 Chronic kidney disease, stage 4 (severe): Secondary | ICD-10-CM | POA: Diagnosis not present

## 2019-05-06 DIAGNOSIS — Z794 Long term (current) use of insulin: Secondary | ICD-10-CM | POA: Diagnosis not present

## 2019-05-06 DIAGNOSIS — G4700 Insomnia, unspecified: Secondary | ICD-10-CM | POA: Diagnosis not present

## 2019-05-06 DIAGNOSIS — K219 Gastro-esophageal reflux disease without esophagitis: Secondary | ICD-10-CM | POA: Diagnosis not present

## 2019-05-07 DIAGNOSIS — T380X5D Adverse effect of glucocorticoids and synthetic analogues, subsequent encounter: Secondary | ICD-10-CM | POA: Diagnosis not present

## 2019-05-07 DIAGNOSIS — E1122 Type 2 diabetes mellitus with diabetic chronic kidney disease: Secondary | ICD-10-CM | POA: Diagnosis not present

## 2019-05-07 DIAGNOSIS — I129 Hypertensive chronic kidney disease with stage 1 through stage 4 chronic kidney disease, or unspecified chronic kidney disease: Secondary | ICD-10-CM | POA: Diagnosis not present

## 2019-05-07 DIAGNOSIS — N184 Chronic kidney disease, stage 4 (severe): Secondary | ICD-10-CM | POA: Diagnosis not present

## 2019-05-07 DIAGNOSIS — J4520 Mild intermittent asthma, uncomplicated: Secondary | ICD-10-CM | POA: Diagnosis not present

## 2019-05-07 DIAGNOSIS — G4700 Insomnia, unspecified: Secondary | ICD-10-CM | POA: Diagnosis not present

## 2019-05-07 DIAGNOSIS — E785 Hyperlipidemia, unspecified: Secondary | ICD-10-CM | POA: Diagnosis not present

## 2019-05-07 DIAGNOSIS — K219 Gastro-esophageal reflux disease without esophagitis: Secondary | ICD-10-CM | POA: Diagnosis not present

## 2019-05-07 DIAGNOSIS — Z794 Long term (current) use of insulin: Secondary | ICD-10-CM | POA: Diagnosis not present

## 2019-05-07 DIAGNOSIS — E1165 Type 2 diabetes mellitus with hyperglycemia: Secondary | ICD-10-CM | POA: Diagnosis not present

## 2019-05-14 DIAGNOSIS — K219 Gastro-esophageal reflux disease without esophagitis: Secondary | ICD-10-CM | POA: Diagnosis not present

## 2019-05-14 DIAGNOSIS — G4700 Insomnia, unspecified: Secondary | ICD-10-CM | POA: Diagnosis not present

## 2019-05-14 DIAGNOSIS — J4520 Mild intermittent asthma, uncomplicated: Secondary | ICD-10-CM | POA: Diagnosis not present

## 2019-05-14 DIAGNOSIS — T380X5D Adverse effect of glucocorticoids and synthetic analogues, subsequent encounter: Secondary | ICD-10-CM | POA: Diagnosis not present

## 2019-05-14 DIAGNOSIS — Z794 Long term (current) use of insulin: Secondary | ICD-10-CM | POA: Diagnosis not present

## 2019-05-14 DIAGNOSIS — E1122 Type 2 diabetes mellitus with diabetic chronic kidney disease: Secondary | ICD-10-CM | POA: Diagnosis not present

## 2019-05-14 DIAGNOSIS — I129 Hypertensive chronic kidney disease with stage 1 through stage 4 chronic kidney disease, or unspecified chronic kidney disease: Secondary | ICD-10-CM | POA: Diagnosis not present

## 2019-05-14 DIAGNOSIS — N184 Chronic kidney disease, stage 4 (severe): Secondary | ICD-10-CM | POA: Diagnosis not present

## 2019-05-14 DIAGNOSIS — E1165 Type 2 diabetes mellitus with hyperglycemia: Secondary | ICD-10-CM | POA: Diagnosis not present

## 2019-05-14 DIAGNOSIS — E785 Hyperlipidemia, unspecified: Secondary | ICD-10-CM | POA: Diagnosis not present

## 2019-05-21 DIAGNOSIS — J4520 Mild intermittent asthma, uncomplicated: Secondary | ICD-10-CM | POA: Diagnosis not present

## 2019-05-21 DIAGNOSIS — Z794 Long term (current) use of insulin: Secondary | ICD-10-CM | POA: Diagnosis not present

## 2019-05-21 DIAGNOSIS — K219 Gastro-esophageal reflux disease without esophagitis: Secondary | ICD-10-CM | POA: Diagnosis not present

## 2019-05-21 DIAGNOSIS — E785 Hyperlipidemia, unspecified: Secondary | ICD-10-CM | POA: Diagnosis not present

## 2019-05-21 DIAGNOSIS — I129 Hypertensive chronic kidney disease with stage 1 through stage 4 chronic kidney disease, or unspecified chronic kidney disease: Secondary | ICD-10-CM | POA: Diagnosis not present

## 2019-05-21 DIAGNOSIS — T380X5D Adverse effect of glucocorticoids and synthetic analogues, subsequent encounter: Secondary | ICD-10-CM | POA: Diagnosis not present

## 2019-05-21 DIAGNOSIS — G4700 Insomnia, unspecified: Secondary | ICD-10-CM | POA: Diagnosis not present

## 2019-05-21 DIAGNOSIS — E1122 Type 2 diabetes mellitus with diabetic chronic kidney disease: Secondary | ICD-10-CM | POA: Diagnosis not present

## 2019-05-21 DIAGNOSIS — E1165 Type 2 diabetes mellitus with hyperglycemia: Secondary | ICD-10-CM | POA: Diagnosis not present

## 2019-05-21 DIAGNOSIS — N184 Chronic kidney disease, stage 4 (severe): Secondary | ICD-10-CM | POA: Diagnosis not present

## 2019-05-27 DIAGNOSIS — E785 Hyperlipidemia, unspecified: Secondary | ICD-10-CM | POA: Diagnosis not present

## 2019-05-27 DIAGNOSIS — E1165 Type 2 diabetes mellitus with hyperglycemia: Secondary | ICD-10-CM | POA: Diagnosis not present

## 2019-05-27 DIAGNOSIS — K219 Gastro-esophageal reflux disease without esophagitis: Secondary | ICD-10-CM | POA: Diagnosis not present

## 2019-05-27 DIAGNOSIS — G4700 Insomnia, unspecified: Secondary | ICD-10-CM | POA: Diagnosis not present

## 2019-05-27 DIAGNOSIS — I129 Hypertensive chronic kidney disease with stage 1 through stage 4 chronic kidney disease, or unspecified chronic kidney disease: Secondary | ICD-10-CM | POA: Diagnosis not present

## 2019-05-27 DIAGNOSIS — Z794 Long term (current) use of insulin: Secondary | ICD-10-CM | POA: Diagnosis not present

## 2019-05-27 DIAGNOSIS — T380X5D Adverse effect of glucocorticoids and synthetic analogues, subsequent encounter: Secondary | ICD-10-CM | POA: Diagnosis not present

## 2019-05-27 DIAGNOSIS — J4520 Mild intermittent asthma, uncomplicated: Secondary | ICD-10-CM | POA: Diagnosis not present

## 2019-05-27 DIAGNOSIS — N184 Chronic kidney disease, stage 4 (severe): Secondary | ICD-10-CM | POA: Diagnosis not present

## 2019-05-27 DIAGNOSIS — E1122 Type 2 diabetes mellitus with diabetic chronic kidney disease: Secondary | ICD-10-CM | POA: Diagnosis not present

## 2019-05-31 DIAGNOSIS — G4733 Obstructive sleep apnea (adult) (pediatric): Secondary | ICD-10-CM | POA: Diagnosis not present

## 2019-06-02 DIAGNOSIS — K219 Gastro-esophageal reflux disease without esophagitis: Secondary | ICD-10-CM | POA: Diagnosis not present

## 2019-06-02 DIAGNOSIS — I129 Hypertensive chronic kidney disease with stage 1 through stage 4 chronic kidney disease, or unspecified chronic kidney disease: Secondary | ICD-10-CM | POA: Diagnosis not present

## 2019-06-02 DIAGNOSIS — T380X5D Adverse effect of glucocorticoids and synthetic analogues, subsequent encounter: Secondary | ICD-10-CM | POA: Diagnosis not present

## 2019-06-02 DIAGNOSIS — E785 Hyperlipidemia, unspecified: Secondary | ICD-10-CM | POA: Diagnosis not present

## 2019-06-02 DIAGNOSIS — E1122 Type 2 diabetes mellitus with diabetic chronic kidney disease: Secondary | ICD-10-CM | POA: Diagnosis not present

## 2019-06-02 DIAGNOSIS — Z794 Long term (current) use of insulin: Secondary | ICD-10-CM | POA: Diagnosis not present

## 2019-06-02 DIAGNOSIS — E1165 Type 2 diabetes mellitus with hyperglycemia: Secondary | ICD-10-CM | POA: Diagnosis not present

## 2019-06-02 DIAGNOSIS — N184 Chronic kidney disease, stage 4 (severe): Secondary | ICD-10-CM | POA: Diagnosis not present

## 2019-06-02 DIAGNOSIS — J4520 Mild intermittent asthma, uncomplicated: Secondary | ICD-10-CM | POA: Diagnosis not present

## 2019-06-02 DIAGNOSIS — G4700 Insomnia, unspecified: Secondary | ICD-10-CM | POA: Diagnosis not present

## 2019-06-03 DIAGNOSIS — N184 Chronic kidney disease, stage 4 (severe): Secondary | ICD-10-CM | POA: Diagnosis not present

## 2019-06-03 DIAGNOSIS — E1122 Type 2 diabetes mellitus with diabetic chronic kidney disease: Secondary | ICD-10-CM | POA: Diagnosis not present

## 2019-06-03 DIAGNOSIS — I129 Hypertensive chronic kidney disease with stage 1 through stage 4 chronic kidney disease, or unspecified chronic kidney disease: Secondary | ICD-10-CM | POA: Diagnosis not present

## 2019-06-03 DIAGNOSIS — T380X5D Adverse effect of glucocorticoids and synthetic analogues, subsequent encounter: Secondary | ICD-10-CM | POA: Diagnosis not present

## 2019-06-03 DIAGNOSIS — E1165 Type 2 diabetes mellitus with hyperglycemia: Secondary | ICD-10-CM | POA: Diagnosis not present

## 2019-06-03 DIAGNOSIS — E785 Hyperlipidemia, unspecified: Secondary | ICD-10-CM | POA: Diagnosis not present

## 2019-06-03 DIAGNOSIS — K219 Gastro-esophageal reflux disease without esophagitis: Secondary | ICD-10-CM | POA: Diagnosis not present

## 2019-06-03 DIAGNOSIS — J4520 Mild intermittent asthma, uncomplicated: Secondary | ICD-10-CM | POA: Diagnosis not present

## 2019-06-03 DIAGNOSIS — Z794 Long term (current) use of insulin: Secondary | ICD-10-CM | POA: Diagnosis not present

## 2019-06-03 DIAGNOSIS — G4700 Insomnia, unspecified: Secondary | ICD-10-CM | POA: Diagnosis not present

## 2019-06-04 DIAGNOSIS — N2581 Secondary hyperparathyroidism of renal origin: Secondary | ICD-10-CM | POA: Diagnosis not present

## 2019-06-04 DIAGNOSIS — N184 Chronic kidney disease, stage 4 (severe): Secondary | ICD-10-CM | POA: Diagnosis not present

## 2019-06-04 DIAGNOSIS — R809 Proteinuria, unspecified: Secondary | ICD-10-CM | POA: Diagnosis not present

## 2019-06-04 DIAGNOSIS — I129 Hypertensive chronic kidney disease with stage 1 through stage 4 chronic kidney disease, or unspecified chronic kidney disease: Secondary | ICD-10-CM | POA: Diagnosis not present

## 2019-06-04 DIAGNOSIS — E1122 Type 2 diabetes mellitus with diabetic chronic kidney disease: Secondary | ICD-10-CM | POA: Diagnosis not present

## 2019-06-19 DIAGNOSIS — R519 Headache, unspecified: Secondary | ICD-10-CM | POA: Diagnosis not present

## 2019-06-19 DIAGNOSIS — Z7189 Other specified counseling: Secondary | ICD-10-CM | POA: Diagnosis not present

## 2019-06-19 DIAGNOSIS — E785 Hyperlipidemia, unspecified: Secondary | ICD-10-CM | POA: Diagnosis not present

## 2019-06-19 DIAGNOSIS — R531 Weakness: Secondary | ICD-10-CM | POA: Diagnosis not present

## 2019-06-19 DIAGNOSIS — R9389 Abnormal findings on diagnostic imaging of other specified body structures: Secondary | ICD-10-CM | POA: Diagnosis not present

## 2019-06-19 DIAGNOSIS — D509 Iron deficiency anemia, unspecified: Secondary | ICD-10-CM | POA: Diagnosis not present

## 2019-06-19 DIAGNOSIS — I10 Essential (primary) hypertension: Secondary | ICD-10-CM | POA: Diagnosis not present

## 2019-06-26 DIAGNOSIS — I700 Atherosclerosis of aorta: Secondary | ICD-10-CM | POA: Diagnosis not present

## 2019-06-26 DIAGNOSIS — R9389 Abnormal findings on diagnostic imaging of other specified body structures: Secondary | ICD-10-CM | POA: Diagnosis not present

## 2019-06-26 DIAGNOSIS — R5383 Other fatigue: Secondary | ICD-10-CM | POA: Diagnosis not present

## 2019-06-30 DIAGNOSIS — G4733 Obstructive sleep apnea (adult) (pediatric): Secondary | ICD-10-CM | POA: Diagnosis not present

## 2019-07-07 DIAGNOSIS — M1712 Unilateral primary osteoarthritis, left knee: Secondary | ICD-10-CM | POA: Diagnosis not present

## 2019-07-15 DIAGNOSIS — G4733 Obstructive sleep apnea (adult) (pediatric): Secondary | ICD-10-CM | POA: Diagnosis not present

## 2019-07-30 DIAGNOSIS — M4317 Spondylolisthesis, lumbosacral region: Secondary | ICD-10-CM | POA: Diagnosis not present

## 2019-07-30 DIAGNOSIS — M545 Low back pain: Secondary | ICD-10-CM | POA: Diagnosis not present

## 2019-07-30 DIAGNOSIS — M4306 Spondylolysis, lumbar region: Secondary | ICD-10-CM | POA: Diagnosis not present

## 2019-07-30 DIAGNOSIS — M5416 Radiculopathy, lumbar region: Secondary | ICD-10-CM | POA: Diagnosis not present

## 2019-07-30 DIAGNOSIS — Z6841 Body Mass Index (BMI) 40.0 and over, adult: Secondary | ICD-10-CM | POA: Insufficient documentation

## 2019-07-30 DIAGNOSIS — I10 Essential (primary) hypertension: Secondary | ICD-10-CM | POA: Diagnosis not present

## 2019-07-31 DIAGNOSIS — G4733 Obstructive sleep apnea (adult) (pediatric): Secondary | ICD-10-CM | POA: Diagnosis not present

## 2019-08-04 ENCOUNTER — Other Ambulatory Visit: Payer: Self-pay | Admitting: Neurosurgery

## 2019-08-04 DIAGNOSIS — M4306 Spondylolysis, lumbar region: Secondary | ICD-10-CM

## 2019-08-04 DIAGNOSIS — N200 Calculus of kidney: Secondary | ICD-10-CM | POA: Diagnosis not present

## 2019-08-04 DIAGNOSIS — D509 Iron deficiency anemia, unspecified: Secondary | ICD-10-CM | POA: Diagnosis not present

## 2019-08-04 DIAGNOSIS — I10 Essential (primary) hypertension: Secondary | ICD-10-CM | POA: Diagnosis not present

## 2019-08-04 DIAGNOSIS — E1165 Type 2 diabetes mellitus with hyperglycemia: Secondary | ICD-10-CM | POA: Diagnosis not present

## 2019-08-04 DIAGNOSIS — E785 Hyperlipidemia, unspecified: Secondary | ICD-10-CM | POA: Diagnosis not present

## 2019-08-04 DIAGNOSIS — M549 Dorsalgia, unspecified: Secondary | ICD-10-CM | POA: Diagnosis not present

## 2019-08-23 DIAGNOSIS — J019 Acute sinusitis, unspecified: Secondary | ICD-10-CM | POA: Diagnosis not present

## 2019-08-23 DIAGNOSIS — B9689 Other specified bacterial agents as the cause of diseases classified elsewhere: Secondary | ICD-10-CM | POA: Diagnosis not present

## 2019-09-08 DIAGNOSIS — Z79899 Other long term (current) drug therapy: Secondary | ICD-10-CM | POA: Diagnosis not present

## 2019-09-08 DIAGNOSIS — H66001 Acute suppurative otitis media without spontaneous rupture of ear drum, right ear: Secondary | ICD-10-CM | POA: Diagnosis not present

## 2019-09-08 DIAGNOSIS — R300 Dysuria: Secondary | ICD-10-CM | POA: Diagnosis not present

## 2019-09-08 DIAGNOSIS — M549 Dorsalgia, unspecified: Secondary | ICD-10-CM | POA: Diagnosis not present

## 2019-09-12 DIAGNOSIS — D485 Neoplasm of uncertain behavior of skin: Secondary | ICD-10-CM | POA: Diagnosis not present

## 2019-09-17 DIAGNOSIS — M1711 Unilateral primary osteoarthritis, right knee: Secondary | ICD-10-CM | POA: Diagnosis not present

## 2019-09-23 DIAGNOSIS — N200 Calculus of kidney: Secondary | ICD-10-CM | POA: Diagnosis not present

## 2019-09-30 DIAGNOSIS — D0461 Carcinoma in situ of skin of right upper limb, including shoulder: Secondary | ICD-10-CM | POA: Diagnosis not present

## 2019-09-30 DIAGNOSIS — C44629 Squamous cell carcinoma of skin of left upper limb, including shoulder: Secondary | ICD-10-CM | POA: Diagnosis not present

## 2019-10-02 DIAGNOSIS — S40012A Contusion of left shoulder, initial encounter: Secondary | ICD-10-CM | POA: Diagnosis not present

## 2019-10-02 DIAGNOSIS — M7582 Other shoulder lesions, left shoulder: Secondary | ICD-10-CM | POA: Diagnosis not present

## 2019-10-15 DIAGNOSIS — N1832 Chronic kidney disease, stage 3b: Secondary | ICD-10-CM | POA: Diagnosis not present

## 2019-10-15 DIAGNOSIS — E1122 Type 2 diabetes mellitus with diabetic chronic kidney disease: Secondary | ICD-10-CM | POA: Diagnosis not present

## 2019-10-15 DIAGNOSIS — N2581 Secondary hyperparathyroidism of renal origin: Secondary | ICD-10-CM | POA: Diagnosis not present

## 2019-10-15 DIAGNOSIS — R809 Proteinuria, unspecified: Secondary | ICD-10-CM | POA: Diagnosis not present

## 2019-10-15 DIAGNOSIS — I129 Hypertensive chronic kidney disease with stage 1 through stage 4 chronic kidney disease, or unspecified chronic kidney disease: Secondary | ICD-10-CM | POA: Diagnosis not present

## 2019-10-17 DIAGNOSIS — H1045 Other chronic allergic conjunctivitis: Secondary | ICD-10-CM | POA: Diagnosis not present

## 2019-10-17 DIAGNOSIS — Z794 Long term (current) use of insulin: Secondary | ICD-10-CM | POA: Diagnosis not present

## 2019-10-17 DIAGNOSIS — H2513 Age-related nuclear cataract, bilateral: Secondary | ICD-10-CM | POA: Diagnosis not present

## 2019-10-21 ENCOUNTER — Other Ambulatory Visit: Payer: Self-pay

## 2019-10-21 ENCOUNTER — Encounter: Payer: Self-pay | Admitting: Sports Medicine

## 2019-10-21 ENCOUNTER — Ambulatory Visit: Payer: Medicare Other | Admitting: Sports Medicine

## 2019-10-21 DIAGNOSIS — M79671 Pain in right foot: Secondary | ICD-10-CM | POA: Diagnosis not present

## 2019-10-21 DIAGNOSIS — L84 Corns and callosities: Secondary | ICD-10-CM | POA: Diagnosis not present

## 2019-10-21 DIAGNOSIS — E1142 Type 2 diabetes mellitus with diabetic polyneuropathy: Secondary | ICD-10-CM | POA: Diagnosis not present

## 2019-10-21 DIAGNOSIS — I739 Peripheral vascular disease, unspecified: Secondary | ICD-10-CM

## 2019-10-21 DIAGNOSIS — M79672 Pain in left foot: Secondary | ICD-10-CM | POA: Diagnosis not present

## 2019-10-21 DIAGNOSIS — M722 Plantar fascial fibromatosis: Secondary | ICD-10-CM | POA: Diagnosis not present

## 2019-10-21 DIAGNOSIS — G8929 Other chronic pain: Secondary | ICD-10-CM

## 2019-10-21 MED ORDER — TRIAMCINOLONE ACETONIDE 10 MG/ML IJ SUSP
10.0000 mg | Freq: Once | INTRAMUSCULAR | Status: AC
  Administered 2019-10-21: 10 mg

## 2019-10-21 NOTE — Patient Instructions (Signed)
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
## 2019-10-22 ENCOUNTER — Telehealth: Payer: Self-pay | Admitting: Sports Medicine

## 2019-10-22 NOTE — Telephone Encounter (Signed)
Pt wanted to know if she could shower.

## 2019-10-22 NOTE — Telephone Encounter (Signed)
Yes she can shower then after shower can apply betadine to right foot

## 2019-10-22 NOTE — Telephone Encounter (Signed)
Pt was notified that per Dr. Cannon Kettle she can shower and after shower to apply betadine to right foot. Pt stated understanding

## 2019-10-28 DIAGNOSIS — L989 Disorder of the skin and subcutaneous tissue, unspecified: Secondary | ICD-10-CM | POA: Diagnosis not present

## 2019-11-11 ENCOUNTER — Encounter: Payer: Self-pay | Admitting: Sports Medicine

## 2019-11-11 ENCOUNTER — Ambulatory Visit: Payer: Medicare Other | Admitting: Sports Medicine

## 2019-11-11 ENCOUNTER — Other Ambulatory Visit: Payer: Self-pay

## 2019-11-11 DIAGNOSIS — I10 Essential (primary) hypertension: Secondary | ICD-10-CM | POA: Diagnosis not present

## 2019-11-11 DIAGNOSIS — M79671 Pain in right foot: Secondary | ICD-10-CM

## 2019-11-11 DIAGNOSIS — M722 Plantar fascial fibromatosis: Secondary | ICD-10-CM

## 2019-11-11 DIAGNOSIS — G8929 Other chronic pain: Secondary | ICD-10-CM

## 2019-11-11 DIAGNOSIS — Z0001 Encounter for general adult medical examination with abnormal findings: Secondary | ICD-10-CM | POA: Diagnosis not present

## 2019-11-11 DIAGNOSIS — Z1159 Encounter for screening for other viral diseases: Secondary | ICD-10-CM | POA: Diagnosis not present

## 2019-11-11 DIAGNOSIS — L84 Corns and callosities: Secondary | ICD-10-CM | POA: Diagnosis not present

## 2019-11-11 DIAGNOSIS — Z1231 Encounter for screening mammogram for malignant neoplasm of breast: Secondary | ICD-10-CM | POA: Diagnosis not present

## 2019-11-11 DIAGNOSIS — M79672 Pain in left foot: Secondary | ICD-10-CM

## 2019-11-11 DIAGNOSIS — E1142 Type 2 diabetes mellitus with diabetic polyneuropathy: Secondary | ICD-10-CM | POA: Diagnosis not present

## 2019-11-11 DIAGNOSIS — Z23 Encounter for immunization: Secondary | ICD-10-CM | POA: Diagnosis not present

## 2019-11-11 DIAGNOSIS — E785 Hyperlipidemia, unspecified: Secondary | ICD-10-CM | POA: Diagnosis not present

## 2019-11-11 DIAGNOSIS — I739 Peripheral vascular disease, unspecified: Secondary | ICD-10-CM

## 2019-11-11 DIAGNOSIS — E1165 Type 2 diabetes mellitus with hyperglycemia: Secondary | ICD-10-CM | POA: Diagnosis not present
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
## 2019-11-18 DIAGNOSIS — E1122 Type 2 diabetes mellitus with diabetic chronic kidney disease: Secondary | ICD-10-CM | POA: Diagnosis not present

## 2019-11-24 DIAGNOSIS — M1711 Unilateral primary osteoarthritis, right knee: Secondary | ICD-10-CM | POA: Diagnosis not present

## 2019-11-24 DIAGNOSIS — M25512 Pain in left shoulder: Secondary | ICD-10-CM | POA: Diagnosis not present

## 2019-11-29 DIAGNOSIS — R1032 Left lower quadrant pain: Secondary | ICD-10-CM | POA: Diagnosis not present

## 2019-12-02 DIAGNOSIS — G4733 Obstructive sleep apnea (adult) (pediatric): Secondary | ICD-10-CM | POA: Diagnosis not present

## 2019-12-03 DIAGNOSIS — E870 Hyperosmolality and hypernatremia: Secondary | ICD-10-CM | POA: Diagnosis not present

## 2020-01-16 ENCOUNTER — Ambulatory Visit: Payer: Medicare Other | Admitting: Sports Medicine

## 2020-01-16 ENCOUNTER — Other Ambulatory Visit: Payer: Self-pay

## 2020-01-16 ENCOUNTER — Encounter: Payer: Self-pay | Admitting: Sports Medicine

## 2020-01-16 DIAGNOSIS — M79672 Pain in left foot: Secondary | ICD-10-CM

## 2020-01-16 DIAGNOSIS — I739 Peripheral vascular disease, unspecified: Secondary | ICD-10-CM

## 2020-01-16 DIAGNOSIS — G8929 Other chronic pain: Secondary | ICD-10-CM | POA: Diagnosis not present

## 2020-01-16 DIAGNOSIS — M79675 Pain in left toe(s): Secondary | ICD-10-CM

## 2020-01-16 DIAGNOSIS — E1142 Type 2 diabetes mellitus with diabetic polyneuropathy: Secondary | ICD-10-CM | POA: Diagnosis not present

## 2020-01-16 DIAGNOSIS — S90426A Blister (nonthermal), unspecified lesser toe(s), initial encounter: Secondary | ICD-10-CM

## 2020-01-16 DIAGNOSIS — M722 Plantar fascial fibromatosis: Secondary | ICD-10-CM

## 2020-01-16 DIAGNOSIS — L02611 Cutaneous abscess of right foot: Secondary | ICD-10-CM

## 2020-01-16 DIAGNOSIS — B351 Tinea unguium: Secondary | ICD-10-CM | POA: Diagnosis not present

## 2020-01-16 DIAGNOSIS — L03031 Cellulitis of right toe: Secondary | ICD-10-CM

## 2020-01-16 DIAGNOSIS — L84 Corns and callosities: Secondary | ICD-10-CM

## 2020-01-16 DIAGNOSIS — M79674 Pain in right toe(s): Secondary | ICD-10-CM

## 2020-01-16 MED ORDER — SULFAMETHOXAZOLE-TRIMETHOPRIM 400-80 MG PO TABS: 1.0000 | ORAL_TABLET | Freq: Two times a day (BID) | ORAL | 0 refills | Status: DC

## 2020-01-16 MED ORDER — TRIAMCINOLONE ACETONIDE 10 MG/ML IJ SUSP
10.0000 mg | Freq: Once | INTRAMUSCULAR | Status: AC
  Administered 2020-01-16: 10 mg
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
## 2020-02-17 DIAGNOSIS — M79671 Pain in right foot: Secondary | ICD-10-CM | POA: Diagnosis not present

## 2020-02-17 DIAGNOSIS — S9031XA Contusion of right foot, initial encounter: Secondary | ICD-10-CM | POA: Diagnosis not present

## 2020-02-17 DIAGNOSIS — M19071 Primary osteoarthritis, right ankle and foot: Secondary | ICD-10-CM | POA: Diagnosis not present

## 2020-02-17 DIAGNOSIS — M7731 Calcaneal spur, right foot: Secondary | ICD-10-CM | POA: Diagnosis not present

## 2020-02-25 ENCOUNTER — Ambulatory Visit: Payer: Medicare Other | Admitting: Sports Medicine

## 2020-03-03 DIAGNOSIS — M4316 Spondylolisthesis, lumbar region: Secondary | ICD-10-CM | POA: Diagnosis not present

## 2020-03-03 DIAGNOSIS — M545 Low back pain, unspecified: Secondary | ICD-10-CM | POA: Diagnosis not present

## 2020-03-03 DIAGNOSIS — M4726 Other spondylosis with radiculopathy, lumbar region: Secondary | ICD-10-CM | POA: Diagnosis not present

## 2020-03-24 DIAGNOSIS — E1165 Type 2 diabetes mellitus with hyperglycemia: Secondary | ICD-10-CM | POA: Diagnosis not present

## 2020-03-24 DIAGNOSIS — R0981 Nasal congestion: Secondary | ICD-10-CM | POA: Diagnosis not present

## 2020-03-24 DIAGNOSIS — M549 Dorsalgia, unspecified: Secondary | ICD-10-CM | POA: Diagnosis not present

## 2020-03-24 DIAGNOSIS — M7989 Other specified soft tissue disorders: Secondary | ICD-10-CM | POA: Diagnosis not present

## 2020-03-24 DIAGNOSIS — E785 Hyperlipidemia, unspecified: Secondary | ICD-10-CM | POA: Diagnosis not present

## 2020-03-24 DIAGNOSIS — Z79899 Other long term (current) drug therapy: Secondary | ICD-10-CM | POA: Diagnosis not present

## 2020-03-24 DIAGNOSIS — I10 Essential (primary) hypertension: Secondary | ICD-10-CM | POA: Diagnosis not present

## 2020-04-02 DIAGNOSIS — R519 Headache, unspecified: Secondary | ICD-10-CM | POA: Diagnosis not present

## 2020-04-02 DIAGNOSIS — Z8616 Personal history of COVID-19: Secondary | ICD-10-CM | POA: Diagnosis not present

## 2020-04-02 DIAGNOSIS — E1122 Type 2 diabetes mellitus with diabetic chronic kidney disease: Secondary | ICD-10-CM | POA: Diagnosis not present

## 2020-04-02 DIAGNOSIS — G4489 Other headache syndrome: Secondary | ICD-10-CM | POA: Diagnosis not present

## 2020-04-02 DIAGNOSIS — Z87440 Personal history of urinary (tract) infections: Secondary | ICD-10-CM | POA: Diagnosis not present

## 2020-04-02 DIAGNOSIS — R509 Fever, unspecified: Secondary | ICD-10-CM | POA: Diagnosis not present

## 2020-04-02 DIAGNOSIS — R112 Nausea with vomiting, unspecified: Secondary | ICD-10-CM | POA: Diagnosis not present

## 2020-04-02 DIAGNOSIS — R1110 Vomiting, unspecified: Secondary | ICD-10-CM | POA: Diagnosis not present

## 2020-04-02 DIAGNOSIS — I129 Hypertensive chronic kidney disease with stage 1 through stage 4 chronic kidney disease, or unspecified chronic kidney disease: Secondary | ICD-10-CM | POA: Diagnosis not present

## 2020-04-02 DIAGNOSIS — K219 Gastro-esophageal reflux disease without esophagitis: Secondary | ICD-10-CM | POA: Diagnosis not present

## 2020-04-02 DIAGNOSIS — I517 Cardiomegaly: Secondary | ICD-10-CM | POA: Diagnosis not present

## 2020-04-02 DIAGNOSIS — E785 Hyperlipidemia, unspecified: Secondary | ICD-10-CM | POA: Diagnosis not present

## 2020-04-02 DIAGNOSIS — Z743 Need for continuous supervision: Secondary | ICD-10-CM | POA: Diagnosis not present

## 2020-04-02 DIAGNOSIS — J45909 Unspecified asthma, uncomplicated: Secondary | ICD-10-CM | POA: Diagnosis not present

## 2020-04-02 DIAGNOSIS — Z7982 Long term (current) use of aspirin: Secondary | ICD-10-CM | POA: Diagnosis not present

## 2020-04-02 DIAGNOSIS — Z79899 Other long term (current) drug therapy: Secondary | ICD-10-CM | POA: Diagnosis not present

## 2020-04-02 DIAGNOSIS — Z881 Allergy status to other antibiotic agents status: Secondary | ICD-10-CM | POA: Diagnosis not present

## 2020-04-02 DIAGNOSIS — D649 Anemia, unspecified: Secondary | ICD-10-CM | POA: Diagnosis not present

## 2020-04-02 DIAGNOSIS — E860 Dehydration: Secondary | ICD-10-CM | POA: Diagnosis not present

## 2020-04-02 DIAGNOSIS — R0902 Hypoxemia: Secondary | ICD-10-CM | POA: Diagnosis not present

## 2020-04-02 DIAGNOSIS — A084 Viral intestinal infection, unspecified: Secondary | ICD-10-CM | POA: Diagnosis not present

## 2020-04-02 DIAGNOSIS — M1990 Unspecified osteoarthritis, unspecified site: Secondary | ICD-10-CM | POA: Diagnosis not present

## 2020-04-02 DIAGNOSIS — N1832 Chronic kidney disease, stage 3b: Secondary | ICD-10-CM | POA: Diagnosis not present

## 2020-04-02 DIAGNOSIS — E872 Acidosis: Secondary | ICD-10-CM | POA: Diagnosis not present

## 2020-04-02 DIAGNOSIS — R531 Weakness: Secondary | ICD-10-CM | POA: Diagnosis not present

## 2020-04-02 DIAGNOSIS — Z794 Long term (current) use of insulin: Secondary | ICD-10-CM | POA: Diagnosis not present

## 2020-04-03 DIAGNOSIS — R1110 Vomiting, unspecified: Secondary | ICD-10-CM | POA: Diagnosis not present

## 2020-04-03 DIAGNOSIS — E785 Hyperlipidemia, unspecified: Secondary | ICD-10-CM | POA: Diagnosis not present

## 2020-04-03 DIAGNOSIS — K219 Gastro-esophageal reflux disease without esophagitis: Secondary | ICD-10-CM | POA: Diagnosis not present

## 2020-04-03 DIAGNOSIS — A084 Viral intestinal infection, unspecified: Secondary | ICD-10-CM | POA: Diagnosis not present

## 2020-04-04 DIAGNOSIS — E785 Hyperlipidemia, unspecified: Secondary | ICD-10-CM | POA: Diagnosis not present

## 2020-04-04 DIAGNOSIS — K219 Gastro-esophageal reflux disease without esophagitis: Secondary | ICD-10-CM | POA: Diagnosis not present

## 2020-04-04 DIAGNOSIS — A084 Viral intestinal infection, unspecified: Secondary | ICD-10-CM | POA: Diagnosis not present

## 2020-04-05 DIAGNOSIS — A084 Viral intestinal infection, unspecified: Secondary | ICD-10-CM | POA: Diagnosis not present

## 2020-04-05 DIAGNOSIS — E785 Hyperlipidemia, unspecified: Secondary | ICD-10-CM | POA: Diagnosis not present

## 2020-04-05 DIAGNOSIS — K219 Gastro-esophageal reflux disease without esophagitis: Secondary | ICD-10-CM | POA: Diagnosis not present

## 2020-04-09 ENCOUNTER — Other Ambulatory Visit: Payer: Self-pay

## 2020-04-09 ENCOUNTER — Encounter: Payer: Self-pay | Admitting: Sports Medicine

## 2020-04-09 ENCOUNTER — Ambulatory Visit: Payer: Medicare Other | Admitting: Sports Medicine

## 2020-04-09 DIAGNOSIS — I739 Peripheral vascular disease, unspecified: Secondary | ICD-10-CM

## 2020-04-09 DIAGNOSIS — G8929 Other chronic pain: Secondary | ICD-10-CM | POA: Diagnosis not present

## 2020-04-09 DIAGNOSIS — E1142 Type 2 diabetes mellitus with diabetic polyneuropathy: Secondary | ICD-10-CM

## 2020-04-09 DIAGNOSIS — M79674 Pain in right toe(s): Secondary | ICD-10-CM | POA: Diagnosis not present

## 2020-04-09 DIAGNOSIS — B351 Tinea unguium: Secondary | ICD-10-CM | POA: Diagnosis not present

## 2020-04-09 DIAGNOSIS — L84 Corns and callosities: Secondary | ICD-10-CM

## 2020-04-09 DIAGNOSIS — M79671 Pain in right foot: Secondary | ICD-10-CM

## 2020-04-09 DIAGNOSIS — M722 Plantar fascial fibromatosis: Secondary | ICD-10-CM

## 2020-04-09 DIAGNOSIS — M79672 Pain in left foot: Secondary | ICD-10-CM | POA: Diagnosis not present

## 2020-04-09 DIAGNOSIS — M170 Bilateral primary osteoarthritis of knee: Secondary | ICD-10-CM | POA: Diagnosis not present

## 2020-04-09 DIAGNOSIS — M79675 Pain in left toe(s): Secondary | ICD-10-CM | POA: Diagnosis not present
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
## 2020-04-21 DIAGNOSIS — M4316 Spondylolisthesis, lumbar region: Secondary | ICD-10-CM | POA: Diagnosis not present

## 2020-04-21 DIAGNOSIS — M4726 Other spondylosis with radiculopathy, lumbar region: Secondary | ICD-10-CM | POA: Diagnosis not present

## 2020-04-22 DIAGNOSIS — J0140 Acute pansinusitis, unspecified: Secondary | ICD-10-CM | POA: Diagnosis not present

## 2020-04-22 DIAGNOSIS — J029 Acute pharyngitis, unspecified: Secondary | ICD-10-CM | POA: Diagnosis not present

## 2020-04-28 ENCOUNTER — Other Ambulatory Visit: Payer: Self-pay | Admitting: Orthopaedic Surgery

## 2020-04-28 DIAGNOSIS — M4726 Other spondylosis with radiculopathy, lumbar region: Secondary | ICD-10-CM

## 2020-04-30 DIAGNOSIS — J0140 Acute pansinusitis, unspecified: Secondary | ICD-10-CM | POA: Diagnosis not present

## 2020-05-10 ENCOUNTER — Telehealth: Payer: Self-pay | Admitting: Allergy and Immunology

## 2020-05-10 ENCOUNTER — Encounter: Payer: Self-pay | Admitting: Allergy and Immunology

## 2020-05-10 ENCOUNTER — Other Ambulatory Visit: Payer: Self-pay

## 2020-05-10 ENCOUNTER — Ambulatory Visit: Payer: Medicare Other | Admitting: Allergy and Immunology

## 2020-05-10 VITALS — BP 124/82 | HR 96 | Resp 16

## 2020-05-10 DIAGNOSIS — J3089 Other allergic rhinitis: Secondary | ICD-10-CM

## 2020-05-10 DIAGNOSIS — K219 Gastro-esophageal reflux disease without esophagitis: Secondary | ICD-10-CM | POA: Diagnosis not present

## 2020-05-10 DIAGNOSIS — J4530 Mild persistent asthma, uncomplicated: Secondary | ICD-10-CM

## 2020-05-10 MED ORDER — FLUTICASONE PROPIONATE 50 MCG/ACT NA SUSP: NASAL | 5 refills | Status: AC

## 2020-05-10 MED ORDER — FLOVENT HFA 110 MCG/ACT IN AERO: 2.0000 | INHALATION_SPRAY | Freq: Two times a day (BID) | RESPIRATORY_TRACT | 5 refills | Status: AC

## 2020-05-10 MED ORDER — FAMOTIDINE 40 MG PO TABS: 40.0000 mg | ORAL_TABLET | Freq: Every day | ORAL | 5 refills | Status: AC

## 2020-05-10 MED ORDER — MONTELUKAST SODIUM 10 MG PO TABS: ORAL_TABLET | ORAL | 5 refills | Status: DC

## 2020-05-10 MED ORDER — LORATADINE 10 MG PO TABS: 10.0000 mg | ORAL_TABLET | Freq: Every day | ORAL | 5 refills | Status: AC

## 2020-05-10 MED ORDER — ALBUTEROL SULFATE HFA 108 (90 BASE) MCG/ACT IN AERS: INHALATION_SPRAY | RESPIRATORY_TRACT | 1 refills | Status: AC

## 2020-05-10 NOTE — Telephone Encounter (Signed)
Patient states Flovent was $80 and she cannot afford that. Patient would like an alternative or discount card if available. Patient also wants to make sure she can take Pepcid with her omeprazole.  Please advise.

## 2020-05-10 NOTE — Telephone Encounter (Signed)
Arnuity 200 would be fine to replace Flovent.  She can take omeprazole and famotidine together as written on her plan.

## 2020-05-10 NOTE — Patient Instructions (Signed)
  1.  Treat and prevent inflammation of airway:   A. Flonase - 1 spray each nostril 2 times per day  B. Flovent 110 - 2 inhalations 2 times per day  C. Montelukast 10 mg - 1 tablet 1 time per day  2.  Treat and prevent reflux / LPR:   A. Omeprazole 40 mg - 1 tablet in AM  B. Famotidine 40 mg - 1 tablet in PM  3.  If needed:   A. Albuterol HFA - 2 inhalations every 4-6 hours  B. Loratadine 10 - 1 tablet 1 time per day  4. Return to clinic in 3 weeks or earlier if problem

## 2020-05-10 NOTE — Telephone Encounter (Signed)
Advised patient about using the omeprazole and famotidine together.  I told her about the Arnuity being a Tier 3 medication and she said anything above a Tier 2 and she's in trouble ( too expensive).  Could we give her a sample of something?  Please advise.

## 2020-05-10 NOTE — Telephone Encounter (Signed)
We can give her alvesco sample - 80 ug. 1 inhalation 2 times per day

## 2020-05-10 NOTE — Progress Notes (Signed)
[place]   Follow-up Note  Referring Provider: Ernestene Kiel, MD Primary Provider: Ernestene Kiel, MD Date of Office Visit: 05/10/2020  Subjective:   Lauren Webb (DOB: 1945-11-22) is a 75 y.o. female who returns to the [facility] on 05/10/2020 in re-evaluation of the following:  HPI: [name] returns to this clinic in reevaluation of her recent respiratory / GI event.  I have not seen her in his clinic in many years and her last contact with his clinic was with Dr. Maudie Mercury via a E-med visit on 18 March 2019.  Apparently she is followed in this clinic for asthma and rhinitis and reflux.  She states that she was doing very well without any medications involving these issues until 3 weeks ago.  At that point in time she developed a gastrointestinal syndrome with unrelenting vomiting and a sinus infection with nasal congestion and headache for which she was admitted to the hospital for at least 3 days.  Ever since that point in time she has been slowly improving but she still has coughing and intermittent sneezing and her eyes are irritated and most significantly she cannot talk because she has laryngitis especially in the morning.  The material emanating from her nose is clear.  The stuff that she coughs up is clear.  She does not have any anosmia or fevers.  Her cough is disturbing her sleep and she does urinate sometimes with her cough.  She has lost 31 pounds of weight with this event and it was actually yesterday that was the first day in which her gastrointestinal issues started to improve significantly and she could actually eat yesterday.  She does not have any anosmia and she is not having any headache recently and she has not been been making any sputum production or has been having any chest pain or lower extremity swelling.  Allergies as of 05/10/2020      Reactions   Avelox [moxifloxacin Hcl In Nacl] Other (See  Comments)   weakness   Moxifloxacin Other (See Comments)   weakness   Nsaids    "Due to Kidney"   Other Other (See Comments)   "Due to Kidney" "Due to Kidney"      Medication List    albuterol 108 (90 Base) MCG/ACT inhaler Commonly known as: VENTOLIN HFA Inhale 2 puffs into the lungs every 6 (six) hours as needed. for wheezing   ALPRAZolam 0.25 MG tablet Commonly known as: XANAX Take 0.25 mg by mouth 2 (two) times daily as needed.   aspirin EC 81 MG tablet Take 81 mg by mouth daily.   B-D UF III MINI PEN NEEDLES 31G X 5 MM Misc Generic drug: Insulin Pen Needle   Cholecalciferol 50 MCG (2000 UT) Caps   fluticasone 50 MCG/ACT nasal spray Commonly known as: FLONASE Place 1 spray into both nostrils daily.   glipiZIDE 10 MG tablet Commonly known as: GLUCOTROL Take 10 mg by mouth 2 (two) times daily as needed (high blood sugar).   HumuLIN 70/30 KwikPen (70-30) 100 UNIT/ML KwikPen Generic drug: insulin isophane & regular human Inject 12-20 Units into the skin 2 (two) times daily with a meal.   HYDROcodone-acetaminophen 5-325 MG tablet Commonly known as: NORCO/VICODIN Take 1 tablet by mouth daily as needed for moderate pain.   montelukast 10 MG tablet Commonly known as: SINGULAIR TAKE 1 TABLET BY MOUTH EVERYDAY AT BEDTIME   omeprazole 40 MG capsule Commonly known as:  PRILOSEC Take 40 mg by mouth at bedtime.   ONE TOUCH ULTRA TEST test strip Generic drug: glucose blood USE AS DIRECTED 4 TIMES A DAY   rosuvastatin 40 MG tablet Commonly known as: CRESTOR Take 20 mg by mouth at bedtime.   silver sulfADIAZINE 1 % cream Commonly known as: SILVADENE SMARTSIG:1 Topical Every Night   tiZANidine 2 MG tablet Commonly known as: ZANAFLEX Take 2 mg by mouth 2 (two) times daily as needed.   torsemide 10 MG tablet Commonly known as: DEMADEX Take 10 mg by mouth daily.   VITAMIN B-12 PO Take 1 tablet by mouth at bedtime.       Past Medical History:  Diagnosis  Date   Allergic rhinitis    Diabetes ([place])    GERD (gastroesophageal reflux disease)    High blood pressure    High cholesterol    Mild intermittent asthma, uncomplicated 08/10/3149    Past Surgical History:  Procedure Laterality Date   [place]    Review of systems negative except as noted in HPI / PMHx or noted below:  Review of Systems  Constitutional: Negative.   HENT: Negative.   Eyes: Negative.   Respiratory: Negative.   Cardiovascular: Negative.   Gastrointestinal: Negative.   Genitourinary: Negative.   Musculoskeletal: Negative.   Skin: Negative.   Neurological: Negative.   Endo/Heme/Allergies: Negative.   Psychiatric/Behavioral: Negative.      Objective:   Vitals:   05/10/20 1107  BP: 124/82  Pulse: 96  Resp: 16  SpO2: 98%          Physical Exam Constitutional:      Appearance: She is not diaphoretic.     Comments: Squeaky high-pitched voice, no coughing  HENT:     Head: Normocephalic.     Right Ear: Tympanic membrane, ear canal and external ear normal.     Left Ear: Tympanic membrane, ear canal and external ear normal.     Nose: Nose normal. No mucosal edema or rhinorrhea.     Mouth/Throat:     Pharynx: Uvula midline. No oropharyngeal exudate.  Eyes:     Conjunctiva/sclera: Conjunctivae normal.  Neck:     Thyroid: No thyromegaly.     Trachea: Trachea normal. No tracheal tenderness or tracheal deviation.  Cardiovascular:     Rate and Rhythm: Normal rate and regular rhythm.     Heart sounds: Normal heart sounds, S1 normal and S2 normal. No murmur heard.   Pulmonary:     Effort: No respiratory distress.     Breath sounds: Normal breath sounds. No stridor. No wheezing or rales.  Lymphadenopathy:     Head:     Right side of head: No tonsillar adenopathy.     Left side of head: No tonsillar adenopathy.     Cervical: No cervical adenopathy.  Skin:    Findings: No  erythema or rash.     Nails: There is no clubbing.  Neurological:     Mental Status: She is alert.     Diagnostics:    Spirometry was performed and demonstrated an FEV1 of 1.86 at 79 % of predicted.  Assessment and Plan:   1. Not well controlled mild persistent asthma   2. Perennial allergic rhinitis   3. LPRD (laryngopharyngeal reflux disease)     1.  Treat and prevent inflammation of airway:   A. Flonase - 1 spray each nostril 2 times  per day  B. Flovent 110 - 2 inhalations 2 times per day  C. Montelukast 10 mg - 1 tablet 1 time per day  2.  Treat and prevent reflux / LPR:   A. Omeprazole 40 mg - 1 tablet in AM  B. Famotidine 40 mg - 1 tablet in PM  3.  If needed:   A. Albuterol HFA - 2 inhalations every 4-6 hours  B. Loratadine 10 - 1 tablet 1 time per day  4. Return to clinic in 3 weeks or earlier if problem  Nancye had some type of gastrointestinal / respiratory infectious event that required her to get hospitalized about 3 weeks ago and she seems to be slowly improving regarding this issue.  Certainly the lining of her GI tract and respiratory track appear to be significantly inflamed and probably damaged by that viral infection.  We will treat her with anti-inflammatory agents for her airway as noted above and increase her therapy for reflux/LPR as I suspect that her laryngitis is probably tied up with LPR that appears to be active while she is coughing.  I will see her back in his clinic in 3 weeks or earlier if there is a problem.  Allena Katz, MD Allergy / Immunology [facility]

## 2020-05-10 NOTE — Telephone Encounter (Signed)
Patient's Flovent is too expensive.  Looks like only other single agent steroid inhaler on her formulary is Arnuity, which is also a Tier 3.  Would you like to change it to something else or give her sample of something until she comes back for her appointment?  Please advise.

## 2020-05-11 ENCOUNTER — Encounter: Payer: Self-pay | Admitting: Allergy and Immunology

## 2020-05-11 NOTE — Telephone Encounter (Signed)
Patient informed.  She might have to send someone up here to pick up the sample.  She will let us know.  Sample placed up front for pick up. I did remind her to rinse, gargle, and spit after use.

## 2020-05-11 NOTE — Telephone Encounter (Signed)
Left message to call the office.

## 2020-05-22 ENCOUNTER — Ambulatory Visit
Admission: RE | Admit: 2020-05-22 | Discharge: 2020-05-22 | Disposition: A | Discharge status: Home / Self Care | Payer: Medicare Other | Source: Ambulatory Visit | Attending: Orthopaedic Surgery | Admitting: Orthopaedic Surgery

## 2020-05-22 ENCOUNTER — Other Ambulatory Visit: Payer: Self-pay

## 2020-05-22 DIAGNOSIS — M4726 Other spondylosis with radiculopathy, lumbar region: Secondary | ICD-10-CM

## 2020-05-22 DIAGNOSIS — M48061 Spinal stenosis, lumbar region without neurogenic claudication: Secondary | ICD-10-CM | POA: Diagnosis not present

## 2020-05-22 DIAGNOSIS — M545 Low back pain, unspecified: Secondary | ICD-10-CM | POA: Diagnosis not present

## 2020-05-25 ENCOUNTER — Ambulatory Visit: Payer: Medicare Other | Admitting: Sports Medicine

## 2020-05-25 ENCOUNTER — Other Ambulatory Visit: Payer: Self-pay

## 2020-05-25 ENCOUNTER — Encounter: Payer: Self-pay | Admitting: Sports Medicine

## 2020-05-25 DIAGNOSIS — I739 Peripheral vascular disease, unspecified: Secondary | ICD-10-CM

## 2020-05-25 DIAGNOSIS — E1142 Type 2 diabetes mellitus with diabetic polyneuropathy: Secondary | ICD-10-CM | POA: Diagnosis not present

## 2020-05-25 DIAGNOSIS — L84 Corns and callosities: Secondary | ICD-10-CM | POA: Diagnosis not present
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
## 2020-05-31 ENCOUNTER — Ambulatory Visit: Payer: Medicare Other | Admitting: Allergy and Immunology

## 2020-06-02 DIAGNOSIS — M4316 Spondylolisthesis, lumbar region: Secondary | ICD-10-CM | POA: Diagnosis not present

## 2020-06-02 DIAGNOSIS — I10 Essential (primary) hypertension: Secondary | ICD-10-CM | POA: Diagnosis not present

## 2020-06-02 DIAGNOSIS — M4726 Other spondylosis with radiculopathy, lumbar region: Secondary | ICD-10-CM | POA: Diagnosis not present

## 2020-06-11 DIAGNOSIS — G4733 Obstructive sleep apnea (adult) (pediatric): Secondary | ICD-10-CM | POA: Diagnosis not present

## 2020-06-16 DIAGNOSIS — M5416 Radiculopathy, lumbar region: Secondary | ICD-10-CM | POA: Diagnosis not present

## 2020-06-16 DIAGNOSIS — M4726 Other spondylosis with radiculopathy, lumbar region: Secondary | ICD-10-CM | POA: Diagnosis not present

## 2020-06-16 DIAGNOSIS — M4316 Spondylolisthesis, lumbar region: Secondary | ICD-10-CM | POA: Diagnosis not present

## 2020-06-23 DIAGNOSIS — M5416 Radiculopathy, lumbar region: Secondary | ICD-10-CM | POA: Diagnosis not present

## 2020-07-13 DIAGNOSIS — M4316 Spondylolisthesis, lumbar region: Secondary | ICD-10-CM | POA: Diagnosis not present

## 2020-07-13 DIAGNOSIS — M5416 Radiculopathy, lumbar region: Secondary | ICD-10-CM | POA: Diagnosis not present

## 2020-07-14 DIAGNOSIS — E1165 Type 2 diabetes mellitus with hyperglycemia: Secondary | ICD-10-CM | POA: Diagnosis not present

## 2020-07-14 DIAGNOSIS — I10 Essential (primary) hypertension: Secondary | ICD-10-CM | POA: Diagnosis not present

## 2020-07-14 DIAGNOSIS — E785 Hyperlipidemia, unspecified: Secondary | ICD-10-CM | POA: Diagnosis not present

## 2020-07-14 DIAGNOSIS — Z79899 Other long term (current) drug therapy: Secondary | ICD-10-CM | POA: Diagnosis not present

## 2020-07-16 ENCOUNTER — Other Ambulatory Visit: Payer: Self-pay | Admitting: *Deleted

## 2020-07-16 MED ORDER — MONTELUKAST SODIUM 10 MG PO TABS: ORAL_TABLET | ORAL | 3 refills | Status: AC

## 2020-07-28 DIAGNOSIS — E876 Hypokalemia: Secondary | ICD-10-CM | POA: Diagnosis not present

## 2020-08-11 DIAGNOSIS — N2581 Secondary hyperparathyroidism of renal origin: Secondary | ICD-10-CM | POA: Diagnosis not present

## 2020-08-11 DIAGNOSIS — E1122 Type 2 diabetes mellitus with diabetic chronic kidney disease: Secondary | ICD-10-CM | POA: Diagnosis not present

## 2020-08-11 DIAGNOSIS — R809 Proteinuria, unspecified: Secondary | ICD-10-CM | POA: Diagnosis not present

## 2020-08-11 DIAGNOSIS — E1129 Type 2 diabetes mellitus with other diabetic kidney complication: Secondary | ICD-10-CM | POA: Diagnosis not present

## 2020-08-11 DIAGNOSIS — I129 Hypertensive chronic kidney disease with stage 1 through stage 4 chronic kidney disease, or unspecified chronic kidney disease: Secondary | ICD-10-CM | POA: Diagnosis not present

## 2020-08-11 DIAGNOSIS — E876 Hypokalemia: Secondary | ICD-10-CM | POA: Diagnosis not present

## 2020-08-11 DIAGNOSIS — N200 Calculus of kidney: Secondary | ICD-10-CM | POA: Diagnosis not present

## 2020-08-11 DIAGNOSIS — N1832 Chronic kidney disease, stage 3b: Secondary | ICD-10-CM | POA: Diagnosis not present

## 2020-08-18 DIAGNOSIS — G8929 Other chronic pain: Secondary | ICD-10-CM | POA: Diagnosis not present

## 2020-08-18 DIAGNOSIS — M25561 Pain in right knee: Secondary | ICD-10-CM | POA: Diagnosis not present

## 2020-08-18 DIAGNOSIS — M25562 Pain in left knee: Secondary | ICD-10-CM | POA: Diagnosis not present

## 2020-09-15 ENCOUNTER — Other Ambulatory Visit: Payer: Self-pay

## 2020-09-15 ENCOUNTER — Encounter: Payer: Self-pay | Admitting: Sports Medicine

## 2020-09-15 ENCOUNTER — Ambulatory Visit: Payer: Medicare Other | Admitting: Sports Medicine

## 2020-09-15 DIAGNOSIS — L84 Corns and callosities: Secondary | ICD-10-CM

## 2020-09-15 DIAGNOSIS — B351 Tinea unguium: Secondary | ICD-10-CM | POA: Diagnosis not present

## 2020-09-15 DIAGNOSIS — E1142 Type 2 diabetes mellitus with diabetic polyneuropathy: Secondary | ICD-10-CM

## 2020-09-15 DIAGNOSIS — M79674 Pain in right toe(s): Secondary | ICD-10-CM

## 2020-09-15 DIAGNOSIS — M722 Plantar fascial fibromatosis: Secondary | ICD-10-CM

## 2020-09-15 DIAGNOSIS — M79675 Pain in left toe(s): Secondary | ICD-10-CM

## 2020-09-15 DIAGNOSIS — I739 Peripheral vascular disease, unspecified: Secondary | ICD-10-CM

## 2020-09-15 MED ORDER — TRIAMCINOLONE ACETONIDE 10 MG/ML IJ SUSP
10.0000 mg | Freq: Once | INTRAMUSCULAR | Status: AC
  Administered 2020-09-15: 10 mg
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
## 2020-09-20 DIAGNOSIS — G4733 Obstructive sleep apnea (adult) (pediatric): Secondary | ICD-10-CM | POA: Diagnosis not present

## 2020-10-14 DIAGNOSIS — E1165 Type 2 diabetes mellitus with hyperglycemia: Secondary | ICD-10-CM | POA: Diagnosis not present

## 2020-10-14 DIAGNOSIS — E785 Hyperlipidemia, unspecified: Secondary | ICD-10-CM | POA: Diagnosis not present

## 2020-10-14 DIAGNOSIS — F5101 Primary insomnia: Secondary | ICD-10-CM | POA: Diagnosis not present

## 2020-10-14 DIAGNOSIS — M549 Dorsalgia, unspecified: Secondary | ICD-10-CM | POA: Diagnosis not present

## 2020-10-14 DIAGNOSIS — I10 Essential (primary) hypertension: Secondary | ICD-10-CM | POA: Diagnosis not present

## 2020-10-14 DIAGNOSIS — J329 Chronic sinusitis, unspecified: Secondary | ICD-10-CM | POA: Diagnosis not present

## 2020-10-14 DIAGNOSIS — Z79899 Other long term (current) drug therapy: Secondary | ICD-10-CM | POA: Diagnosis not present

## 2020-10-19 DIAGNOSIS — E139 Other specified diabetes mellitus without complications: Secondary | ICD-10-CM | POA: Diagnosis not present

## 2020-10-19 DIAGNOSIS — H2513 Age-related nuclear cataract, bilateral: Secondary | ICD-10-CM | POA: Diagnosis not present

## 2020-10-19 DIAGNOSIS — Z794 Long term (current) use of insulin: Secondary | ICD-10-CM | POA: Diagnosis not present

## 2020-10-26 DIAGNOSIS — M1612 Unilateral primary osteoarthritis, left hip: Secondary | ICD-10-CM | POA: Diagnosis not present

## 2020-10-27 DIAGNOSIS — R233 Spontaneous ecchymoses: Secondary | ICD-10-CM | POA: Diagnosis not present

## 2020-10-27 DIAGNOSIS — L570 Actinic keratosis: Secondary | ICD-10-CM | POA: Diagnosis not present

## 2020-10-27 DIAGNOSIS — D485 Neoplasm of uncertain behavior of skin: Secondary | ICD-10-CM | POA: Diagnosis not present

## 2020-11-16 ENCOUNTER — Telehealth: Payer: Self-pay | Admitting: Allergy and Immunology

## 2020-11-16 NOTE — Telephone Encounter (Signed)
Please advise 
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
## 2020-11-16 NOTE — Telephone Encounter (Signed)
Spoke with [facility]- told her to stop using Fluticasone until the sores in her nose heal. Told her to use Nasal saline multiple times per day. Her main concern is the mucus discharge from her left eye that she has had for 3 weeks- she thinks it is infected.  She is currently using Clear eyes. Please advise if an eye drop can be sent in?

## 2020-11-16 NOTE — Telephone Encounter (Signed)
Please let Lauren Webb know that there is only so much we can do over the phone.  She can use nasal saline multiple times per day, Systane eyedrops multiple times per day, and an antihistamine such as loratadine or cetirizine 10 mg once a day.  If she is using a nasal steroid she should stop that nasal steroid into her nose heals up.  We will be happy to see her in the clinic.

## 2020-11-17 NOTE — Telephone Encounter (Signed)
Please inform Kashlyn that I am not actually sure what is going on with mucus production from an eye for 3 weeks and I am not really sure what type of therapy should be administered for that issue.  She probably needs to be seen by her eye doctor or she can come in to see [place].  There may be a foreign body in the eye that is producing that mucus.

## 2020-11-22 NOTE — Telephone Encounter (Signed)
Patient states she is taking an antibiotic already and it seems to be working well. Patient will see eye doctor if issue persists.

## 2020-12-15 ENCOUNTER — Ambulatory Visit: Payer: Medicare Other | Admitting: Sports Medicine

## 2020-12-15 ENCOUNTER — Other Ambulatory Visit: Payer: Self-pay

## 2020-12-15 ENCOUNTER — Encounter: Payer: Self-pay | Admitting: Sports Medicine

## 2020-12-15 DIAGNOSIS — E1142 Type 2 diabetes mellitus with diabetic polyneuropathy: Secondary | ICD-10-CM

## 2020-12-15 DIAGNOSIS — M79675 Pain in left toe(s): Secondary | ICD-10-CM

## 2020-12-15 DIAGNOSIS — B351 Tinea unguium: Secondary | ICD-10-CM | POA: Diagnosis not present

## 2020-12-15 DIAGNOSIS — I739 Peripheral vascular disease, unspecified: Secondary | ICD-10-CM

## 2020-12-15 DIAGNOSIS — M7062 Trochanteric bursitis, left hip: Secondary | ICD-10-CM | POA: Diagnosis not present

## 2020-12-15 DIAGNOSIS — M79674 Pain in right toe(s): Secondary | ICD-10-CM

## 2020-12-15 DIAGNOSIS — Z23 Encounter for immunization: Secondary | ICD-10-CM | POA: Diagnosis not present

## 2020-12-15 DIAGNOSIS — L84 Corns and callosities: Secondary | ICD-10-CM
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
## 2020-12-21 DIAGNOSIS — L03115 Cellulitis of right lower limb: Secondary | ICD-10-CM | POA: Diagnosis not present

## 2020-12-23 DIAGNOSIS — E1165 Type 2 diabetes mellitus with hyperglycemia: Secondary | ICD-10-CM | POA: Diagnosis not present

## 2020-12-23 DIAGNOSIS — S81801D Unspecified open wound, right lower leg, subsequent encounter: Secondary | ICD-10-CM | POA: Diagnosis not present

## 2020-12-28 DIAGNOSIS — G4733 Obstructive sleep apnea (adult) (pediatric): Secondary | ICD-10-CM | POA: Diagnosis not present

## 2020-12-29 DIAGNOSIS — E1122 Type 2 diabetes mellitus with diabetic chronic kidney disease: Secondary | ICD-10-CM | POA: Diagnosis not present

## 2020-12-29 DIAGNOSIS — S81801A Unspecified open wound, right lower leg, initial encounter: Secondary | ICD-10-CM | POA: Diagnosis not present

## 2020-12-29 DIAGNOSIS — I129 Hypertensive chronic kidney disease with stage 1 through stage 4 chronic kidney disease, or unspecified chronic kidney disease: Secondary | ICD-10-CM | POA: Diagnosis not present

## 2020-12-29 DIAGNOSIS — L97212 Non-pressure chronic ulcer of right calf with fat layer exposed: Secondary | ICD-10-CM | POA: Diagnosis not present

## 2020-12-29 DIAGNOSIS — N189 Chronic kidney disease, unspecified: Secondary | ICD-10-CM | POA: Diagnosis not present

## 2020-12-30 DIAGNOSIS — C44622 Squamous cell carcinoma of skin of right upper limb, including shoulder: Secondary | ICD-10-CM | POA: Diagnosis not present

## 2020-12-30 DIAGNOSIS — D0462 Carcinoma in situ of skin of left upper limb, including shoulder: Secondary | ICD-10-CM | POA: Diagnosis not present

## 2020-12-30 DIAGNOSIS — L97509 Non-pressure chronic ulcer of other part of unspecified foot with unspecified severity: Secondary | ICD-10-CM | POA: Diagnosis not present

## 2021-01-05 DIAGNOSIS — L97812 Non-pressure chronic ulcer of other part of right lower leg with fat layer exposed: Secondary | ICD-10-CM | POA: Diagnosis not present

## 2021-01-05 DIAGNOSIS — I878 Other specified disorders of veins: Secondary | ICD-10-CM | POA: Diagnosis not present

## 2021-01-05 DIAGNOSIS — L97212 Non-pressure chronic ulcer of right calf with fat layer exposed: Secondary | ICD-10-CM | POA: Diagnosis not present

## 2021-01-11 DIAGNOSIS — L02414 Cutaneous abscess of left upper limb: Secondary | ICD-10-CM | POA: Diagnosis not present

## 2021-01-19 DIAGNOSIS — L570 Actinic keratosis: Secondary | ICD-10-CM | POA: Diagnosis not present

## 2021-02-21 DIAGNOSIS — M25552 Pain in left hip: Secondary | ICD-10-CM | POA: Diagnosis not present

## 2021-02-21 DIAGNOSIS — M6281 Muscle weakness (generalized): Secondary | ICD-10-CM | POA: Diagnosis not present

## 2021-02-21 DIAGNOSIS — R2689 Other abnormalities of gait and mobility: Secondary | ICD-10-CM | POA: Diagnosis not present

## 2021-02-21 DIAGNOSIS — M25652 Stiffness of left hip, not elsewhere classified: Secondary | ICD-10-CM | POA: Diagnosis not present

## 2021-02-23 ENCOUNTER — Ambulatory Visit: Payer: Medicare Other | Admitting: Sports Medicine

## 2021-02-24 DIAGNOSIS — G4733 Obstructive sleep apnea (adult) (pediatric): Secondary | ICD-10-CM | POA: Diagnosis not present

## 2021-02-24 DIAGNOSIS — M25652 Stiffness of left hip, not elsewhere classified: Secondary | ICD-10-CM | POA: Diagnosis not present

## 2021-02-24 DIAGNOSIS — M6281 Muscle weakness (generalized): Secondary | ICD-10-CM | POA: Diagnosis not present

## 2021-02-24 DIAGNOSIS — M25552 Pain in left hip: Secondary | ICD-10-CM | POA: Diagnosis not present

## 2021-02-24 DIAGNOSIS — R2689 Other abnormalities of gait and mobility: Secondary | ICD-10-CM | POA: Diagnosis not present

## 2021-02-28 DIAGNOSIS — R2689 Other abnormalities of gait and mobility: Secondary | ICD-10-CM | POA: Diagnosis not present

## 2021-02-28 DIAGNOSIS — M6281 Muscle weakness (generalized): Secondary | ICD-10-CM | POA: Diagnosis not present

## 2021-02-28 DIAGNOSIS — M25652 Stiffness of left hip, not elsewhere classified: Secondary | ICD-10-CM | POA: Diagnosis not present

## 2021-02-28 DIAGNOSIS — M25552 Pain in left hip: Secondary | ICD-10-CM | POA: Diagnosis not present

## 2021-03-03 DIAGNOSIS — E785 Hyperlipidemia, unspecified: Secondary | ICD-10-CM | POA: Diagnosis not present

## 2021-03-03 DIAGNOSIS — R2689 Other abnormalities of gait and mobility: Secondary | ICD-10-CM | POA: Diagnosis not present

## 2021-03-03 DIAGNOSIS — Z79899 Other long term (current) drug therapy: Secondary | ICD-10-CM | POA: Diagnosis not present

## 2021-03-03 DIAGNOSIS — M6281 Muscle weakness (generalized): Secondary | ICD-10-CM | POA: Diagnosis not present

## 2021-03-03 DIAGNOSIS — J0100 Acute maxillary sinusitis, unspecified: Secondary | ICD-10-CM | POA: Diagnosis not present

## 2021-03-03 DIAGNOSIS — M25552 Pain in left hip: Secondary | ICD-10-CM | POA: Diagnosis not present

## 2021-03-03 DIAGNOSIS — M25652 Stiffness of left hip, not elsewhere classified: Secondary | ICD-10-CM | POA: Diagnosis not present

## 2021-03-03 DIAGNOSIS — E1165 Type 2 diabetes mellitus with hyperglycemia: Secondary | ICD-10-CM | POA: Diagnosis not present

## 2021-03-03 DIAGNOSIS — N1832 Chronic kidney disease, stage 3b: Secondary | ICD-10-CM | POA: Diagnosis not present

## 2021-03-13 DIAGNOSIS — M8580 Other specified disorders of bone density and structure, unspecified site: Secondary | ICD-10-CM | POA: Diagnosis not present

## 2021-03-13 DIAGNOSIS — M79602 Pain in left arm: Secondary | ICD-10-CM | POA: Diagnosis not present

## 2021-03-13 DIAGNOSIS — M1812 Unilateral primary osteoarthritis of first carpometacarpal joint, left hand: Secondary | ICD-10-CM | POA: Diagnosis not present

## 2021-03-13 DIAGNOSIS — M25532 Pain in left wrist: Secondary | ICD-10-CM | POA: Diagnosis not present

## 2021-03-13 DIAGNOSIS — M25522 Pain in left elbow: Secondary | ICD-10-CM | POA: Diagnosis not present

## 2021-03-16 DIAGNOSIS — M79602 Pain in left arm: Secondary | ICD-10-CM | POA: Diagnosis not present

## 2021-03-18 DIAGNOSIS — K429 Umbilical hernia without obstruction or gangrene: Secondary | ICD-10-CM | POA: Diagnosis not present

## 2021-03-18 DIAGNOSIS — K753 Granulomatous hepatitis, not elsewhere classified: Secondary | ICD-10-CM | POA: Diagnosis not present

## 2021-03-18 DIAGNOSIS — D7589 Other specified diseases of blood and blood-forming organs: Secondary | ICD-10-CM | POA: Diagnosis not present

## 2021-03-18 DIAGNOSIS — J9811 Atelectasis: Secondary | ICD-10-CM | POA: Diagnosis not present

## 2021-03-18 DIAGNOSIS — M5137 Other intervertebral disc degeneration, lumbosacral region: Secondary | ICD-10-CM | POA: Diagnosis not present

## 2021-03-18 DIAGNOSIS — R531 Weakness: Secondary | ICD-10-CM | POA: Diagnosis not present

## 2021-03-18 DIAGNOSIS — N183 Chronic kidney disease, stage 3 unspecified: Secondary | ICD-10-CM | POA: Diagnosis not present

## 2021-03-18 DIAGNOSIS — Z79899 Other long term (current) drug therapy: Secondary | ICD-10-CM | POA: Diagnosis not present

## 2021-03-18 DIAGNOSIS — S51811A Laceration without foreign body of right forearm, initial encounter: Secondary | ICD-10-CM | POA: Diagnosis not present

## 2021-03-18 DIAGNOSIS — M4317 Spondylolisthesis, lumbosacral region: Secondary | ICD-10-CM | POA: Diagnosis not present

## 2021-03-18 DIAGNOSIS — M25562 Pain in left knee: Secondary | ICD-10-CM | POA: Diagnosis not present

## 2021-03-18 DIAGNOSIS — I2510 Atherosclerotic heart disease of native coronary artery without angina pectoris: Secondary | ICD-10-CM | POA: Diagnosis not present

## 2021-03-18 DIAGNOSIS — S51812A Laceration without foreign body of left forearm, initial encounter: Secondary | ICD-10-CM | POA: Diagnosis not present

## 2021-03-18 DIAGNOSIS — W19XXXA Unspecified fall, initial encounter: Secondary | ICD-10-CM | POA: Diagnosis not present

## 2021-03-18 DIAGNOSIS — E1122 Type 2 diabetes mellitus with diabetic chronic kidney disease: Secondary | ICD-10-CM | POA: Diagnosis not present

## 2021-03-18 DIAGNOSIS — Z20822 Contact with and (suspected) exposure to covid-19: Secondary | ICD-10-CM | POA: Diagnosis not present

## 2021-03-18 DIAGNOSIS — I129 Hypertensive chronic kidney disease with stage 1 through stage 4 chronic kidney disease, or unspecified chronic kidney disease: Secondary | ICD-10-CM | POA: Diagnosis not present

## 2021-03-18 DIAGNOSIS — R2242 Localized swelling, mass and lump, left lower limb: Secondary | ICD-10-CM | POA: Diagnosis not present

## 2021-03-18 DIAGNOSIS — K219 Gastro-esophageal reflux disease without esophagitis: Secondary | ICD-10-CM | POA: Diagnosis not present

## 2021-03-18 DIAGNOSIS — Z743 Need for continuous supervision: Secondary | ICD-10-CM | POA: Diagnosis not present

## 2021-03-18 DIAGNOSIS — R739 Hyperglycemia, unspecified: Secondary | ICD-10-CM | POA: Diagnosis not present

## 2021-03-18 DIAGNOSIS — M47816 Spondylosis without myelopathy or radiculopathy, lumbar region: Secondary | ICD-10-CM | POA: Diagnosis not present

## 2021-03-18 DIAGNOSIS — J069 Acute upper respiratory infection, unspecified: Secondary | ICD-10-CM | POA: Diagnosis not present

## 2021-03-18 DIAGNOSIS — R222 Localized swelling, mass and lump, trunk: Secondary | ICD-10-CM | POA: Diagnosis not present

## 2021-03-18 DIAGNOSIS — D696 Thrombocytopenia, unspecified: Secondary | ICD-10-CM | POA: Diagnosis not present

## 2021-03-18 DIAGNOSIS — M48061 Spinal stenosis, lumbar region without neurogenic claudication: Secondary | ICD-10-CM | POA: Diagnosis not present

## 2021-03-18 DIAGNOSIS — J8410 Pulmonary fibrosis, unspecified: Secondary | ICD-10-CM | POA: Diagnosis not present

## 2021-03-18 DIAGNOSIS — M2578 Osteophyte, vertebrae: Secondary | ICD-10-CM | POA: Diagnosis not present

## 2021-03-18 DIAGNOSIS — W182XXA Fall in (into) shower or empty bathtub, initial encounter: Secondary | ICD-10-CM | POA: Diagnosis not present

## 2021-03-18 DIAGNOSIS — Z794 Long term (current) use of insulin: Secondary | ICD-10-CM | POA: Diagnosis not present

## 2021-03-18 DIAGNOSIS — Y998 Other external cause status: Secondary | ICD-10-CM | POA: Diagnosis not present

## 2021-03-18 DIAGNOSIS — M25552 Pain in left hip: Secondary | ICD-10-CM | POA: Diagnosis not present

## 2021-03-18 DIAGNOSIS — E785 Hyperlipidemia, unspecified: Secondary | ICD-10-CM | POA: Diagnosis not present

## 2021-03-19 DIAGNOSIS — K219 Gastro-esophageal reflux disease without esophagitis: Secondary | ICD-10-CM | POA: Diagnosis not present

## 2021-03-19 DIAGNOSIS — R7989 Other specified abnormal findings of blood chemistry: Secondary | ICD-10-CM | POA: Diagnosis not present

## 2021-03-19 DIAGNOSIS — R531 Weakness: Secondary | ICD-10-CM | POA: Diagnosis not present

## 2021-03-19 DIAGNOSIS — E785 Hyperlipidemia, unspecified: Secondary | ICD-10-CM | POA: Diagnosis not present

## 2021-03-19 DIAGNOSIS — E1165 Type 2 diabetes mellitus with hyperglycemia: Secondary | ICD-10-CM | POA: Diagnosis not present

## 2021-03-19 DIAGNOSIS — E119 Type 2 diabetes mellitus without complications: Secondary | ICD-10-CM | POA: Insufficient documentation

## 2021-03-19 DIAGNOSIS — I10 Essential (primary) hypertension: Secondary | ICD-10-CM | POA: Diagnosis not present

## 2021-03-19 DIAGNOSIS — Z794 Long term (current) use of insulin: Secondary | ICD-10-CM | POA: Diagnosis not present

## 2021-03-20 DIAGNOSIS — J069 Acute upper respiratory infection, unspecified: Secondary | ICD-10-CM | POA: Diagnosis not present

## 2021-03-20 DIAGNOSIS — D696 Thrombocytopenia, unspecified: Secondary | ICD-10-CM | POA: Diagnosis not present

## 2021-03-20 DIAGNOSIS — I129 Hypertensive chronic kidney disease with stage 1 through stage 4 chronic kidney disease, or unspecified chronic kidney disease: Secondary | ICD-10-CM | POA: Diagnosis not present

## 2021-03-20 DIAGNOSIS — R7989 Other specified abnormal findings of blood chemistry: Secondary | ICD-10-CM | POA: Diagnosis not present

## 2021-03-20 DIAGNOSIS — M25522 Pain in left elbow: Secondary | ICD-10-CM | POA: Diagnosis not present

## 2021-03-20 DIAGNOSIS — M25512 Pain in left shoulder: Secondary | ICD-10-CM | POA: Diagnosis not present

## 2021-03-20 DIAGNOSIS — E785 Hyperlipidemia, unspecified: Secondary | ICD-10-CM | POA: Diagnosis not present

## 2021-03-20 DIAGNOSIS — D7589 Other specified diseases of blood and blood-forming organs: Secondary | ICD-10-CM | POA: Diagnosis not present

## 2021-03-20 DIAGNOSIS — R531 Weakness: Secondary | ICD-10-CM | POA: Diagnosis not present

## 2021-03-20 DIAGNOSIS — I700 Atherosclerosis of aorta: Secondary | ICD-10-CM | POA: Diagnosis not present

## 2021-03-20 DIAGNOSIS — E1122 Type 2 diabetes mellitus with diabetic chronic kidney disease: Secondary | ICD-10-CM | POA: Diagnosis not present

## 2021-03-20 DIAGNOSIS — M542 Cervicalgia: Secondary | ICD-10-CM | POA: Diagnosis not present

## 2021-03-20 DIAGNOSIS — N183 Chronic kidney disease, stage 3 unspecified: Secondary | ICD-10-CM | POA: Diagnosis not present

## 2021-03-20 DIAGNOSIS — K219 Gastro-esophageal reflux disease without esophagitis: Secondary | ICD-10-CM | POA: Diagnosis not present

## 2021-03-21 DIAGNOSIS — D7589 Other specified diseases of blood and blood-forming organs: Secondary | ICD-10-CM | POA: Diagnosis not present

## 2021-03-21 DIAGNOSIS — E785 Hyperlipidemia, unspecified: Secondary | ICD-10-CM | POA: Diagnosis not present

## 2021-03-21 DIAGNOSIS — K219 Gastro-esophageal reflux disease without esophagitis: Secondary | ICD-10-CM | POA: Diagnosis not present

## 2021-03-21 DIAGNOSIS — R262 Difficulty in walking, not elsewhere classified: Secondary | ICD-10-CM | POA: Diagnosis not present

## 2021-03-21 DIAGNOSIS — I6523 Occlusion and stenosis of bilateral carotid arteries: Secondary | ICD-10-CM | POA: Diagnosis not present

## 2021-03-21 DIAGNOSIS — M25562 Pain in left knee: Secondary | ICD-10-CM | POA: Insufficient documentation

## 2021-03-21 DIAGNOSIS — E1140 Type 2 diabetes mellitus with diabetic neuropathy, unspecified: Secondary | ICD-10-CM | POA: Diagnosis not present

## 2021-03-21 DIAGNOSIS — M2550 Pain in unspecified joint: Secondary | ICD-10-CM | POA: Diagnosis not present

## 2021-03-21 DIAGNOSIS — W19XXXA Unspecified fall, initial encounter: Secondary | ICD-10-CM | POA: Insufficient documentation

## 2021-03-21 DIAGNOSIS — M47812 Spondylosis without myelopathy or radiculopathy, cervical region: Secondary | ICD-10-CM | POA: Diagnosis not present

## 2021-03-21 DIAGNOSIS — I119 Hypertensive heart disease without heart failure: Secondary | ICD-10-CM | POA: Diagnosis not present

## 2021-03-21 DIAGNOSIS — Z20822 Contact with and (suspected) exposure to covid-19: Secondary | ICD-10-CM | POA: Diagnosis not present

## 2021-03-21 DIAGNOSIS — J9811 Atelectasis: Secondary | ICD-10-CM | POA: Diagnosis not present

## 2021-03-21 DIAGNOSIS — D696 Thrombocytopenia, unspecified: Secondary | ICD-10-CM | POA: Diagnosis not present

## 2021-03-21 DIAGNOSIS — Z79899 Other long term (current) drug therapy: Secondary | ICD-10-CM | POA: Diagnosis not present

## 2021-03-21 DIAGNOSIS — M109 Gout, unspecified: Secondary | ICD-10-CM | POA: Diagnosis not present

## 2021-03-21 DIAGNOSIS — I129 Hypertensive chronic kidney disease with stage 1 through stage 4 chronic kidney disease, or unspecified chronic kidney disease: Secondary | ICD-10-CM | POA: Diagnosis not present

## 2021-03-21 DIAGNOSIS — E119 Type 2 diabetes mellitus without complications: Secondary | ICD-10-CM | POA: Diagnosis not present

## 2021-03-21 DIAGNOSIS — R279 Unspecified lack of coordination: Secondary | ICD-10-CM | POA: Diagnosis not present

## 2021-03-21 DIAGNOSIS — I10 Essential (primary) hypertension: Secondary | ICD-10-CM | POA: Diagnosis not present

## 2021-03-21 DIAGNOSIS — R2689 Other abnormalities of gait and mobility: Secondary | ICD-10-CM | POA: Diagnosis not present

## 2021-03-21 DIAGNOSIS — Z741 Need for assistance with personal care: Secondary | ICD-10-CM | POA: Diagnosis not present

## 2021-03-21 DIAGNOSIS — Z794 Long term (current) use of insulin: Secondary | ICD-10-CM | POA: Diagnosis not present

## 2021-03-21 DIAGNOSIS — E1122 Type 2 diabetes mellitus with diabetic chronic kidney disease: Secondary | ICD-10-CM | POA: Diagnosis not present

## 2021-03-21 DIAGNOSIS — M25362 Other instability, left knee: Secondary | ICD-10-CM | POA: Diagnosis not present

## 2021-03-21 DIAGNOSIS — M6281 Muscle weakness (generalized): Secondary | ICD-10-CM | POA: Diagnosis not present

## 2021-03-21 DIAGNOSIS — R7989 Other specified abnormal findings of blood chemistry: Secondary | ICD-10-CM | POA: Diagnosis not present

## 2021-03-21 DIAGNOSIS — L03113 Cellulitis of right upper limb: Secondary | ICD-10-CM | POA: Diagnosis not present

## 2021-03-21 DIAGNOSIS — R531 Weakness: Secondary | ICD-10-CM | POA: Diagnosis not present

## 2021-03-21 DIAGNOSIS — N183 Chronic kidney disease, stage 3 unspecified: Secondary | ICD-10-CM | POA: Diagnosis not present

## 2021-03-21 DIAGNOSIS — E1151 Type 2 diabetes mellitus with diabetic peripheral angiopathy without gangrene: Secondary | ICD-10-CM | POA: Diagnosis not present

## 2021-03-21 DIAGNOSIS — J069 Acute upper respiratory infection, unspecified: Secondary | ICD-10-CM | POA: Diagnosis not present

## 2021-03-22 DIAGNOSIS — M2550 Pain in unspecified joint: Secondary | ICD-10-CM | POA: Diagnosis not present

## 2021-03-22 DIAGNOSIS — E1151 Type 2 diabetes mellitus with diabetic peripheral angiopathy without gangrene: Secondary | ICD-10-CM | POA: Diagnosis not present

## 2021-03-22 DIAGNOSIS — R262 Difficulty in walking, not elsewhere classified: Secondary | ICD-10-CM | POA: Diagnosis not present

## 2021-03-22 DIAGNOSIS — I119 Hypertensive heart disease without heart failure: Secondary | ICD-10-CM | POA: Diagnosis not present

## 2021-04-05 DIAGNOSIS — S7002XA Contusion of left hip, initial encounter: Secondary | ICD-10-CM | POA: Diagnosis not present

## 2021-04-05 DIAGNOSIS — M7062 Trochanteric bursitis, left hip: Secondary | ICD-10-CM | POA: Diagnosis not present

## 2021-04-12 DIAGNOSIS — M791 Myalgia, unspecified site: Secondary | ICD-10-CM | POA: Diagnosis not present

## 2021-04-12 DIAGNOSIS — R3915 Urgency of urination: Secondary | ICD-10-CM | POA: Diagnosis not present

## 2021-04-12 DIAGNOSIS — R051 Acute cough: Secondary | ICD-10-CM | POA: Diagnosis not present

## 2021-04-26 DIAGNOSIS — M4316 Spondylolisthesis, lumbar region: Secondary | ICD-10-CM | POA: Diagnosis not present

## 2021-04-26 DIAGNOSIS — M549 Dorsalgia, unspecified: Secondary | ICD-10-CM | POA: Diagnosis not present

## 2021-04-26 DIAGNOSIS — M5416 Radiculopathy, lumbar region: Secondary | ICD-10-CM | POA: Diagnosis not present

## 2021-04-27 ENCOUNTER — Other Ambulatory Visit: Payer: Self-pay | Admitting: Orthopaedic Surgery

## 2021-04-27 DIAGNOSIS — M5416 Radiculopathy, lumbar region: Secondary | ICD-10-CM

## 2021-05-03 ENCOUNTER — Ambulatory Visit
Admission: RE | Admit: 2021-05-03 | Discharge: 2021-05-03 | Disposition: A | Discharge status: Home / Self Care | Payer: Medicare Other | Source: Ambulatory Visit | Attending: Orthopaedic Surgery | Admitting: Orthopaedic Surgery

## 2021-05-03 ENCOUNTER — Other Ambulatory Visit: Payer: Self-pay

## 2021-05-03 DIAGNOSIS — M545 Low back pain, unspecified: Secondary | ICD-10-CM | POA: Diagnosis not present

## 2021-05-03 DIAGNOSIS — R29898 Other symptoms and signs involving the musculoskeletal system: Secondary | ICD-10-CM | POA: Diagnosis not present

## 2021-05-03 DIAGNOSIS — M5416 Radiculopathy, lumbar region: Secondary | ICD-10-CM

## 2021-05-09 DIAGNOSIS — E1122 Type 2 diabetes mellitus with diabetic chronic kidney disease: Secondary | ICD-10-CM | POA: Diagnosis not present

## 2021-05-09 DIAGNOSIS — I129 Hypertensive chronic kidney disease with stage 1 through stage 4 chronic kidney disease, or unspecified chronic kidney disease: Secondary | ICD-10-CM | POA: Diagnosis not present

## 2021-05-09 DIAGNOSIS — R809 Proteinuria, unspecified: Secondary | ICD-10-CM | POA: Diagnosis not present

## 2021-05-09 DIAGNOSIS — N200 Calculus of kidney: Secondary | ICD-10-CM | POA: Diagnosis not present

## 2021-05-09 DIAGNOSIS — N1832 Chronic kidney disease, stage 3b: Secondary | ICD-10-CM | POA: Diagnosis not present

## 2021-05-09 DIAGNOSIS — N2581 Secondary hyperparathyroidism of renal origin: Secondary | ICD-10-CM | POA: Diagnosis not present

## 2021-05-10 ENCOUNTER — Ambulatory Visit: Payer: Medicare Other | Admitting: Sports Medicine

## 2021-05-11 ENCOUNTER — Ambulatory Visit (INDEPENDENT_AMBULATORY_CARE_PROVIDER_SITE_OTHER): Payer: Medicare Other | Admitting: Sports Medicine

## 2021-05-11 ENCOUNTER — Encounter: Payer: Self-pay | Admitting: Sports Medicine

## 2021-05-11 DIAGNOSIS — B351 Tinea unguium: Secondary | ICD-10-CM

## 2021-05-11 DIAGNOSIS — M79674 Pain in right toe(s): Secondary | ICD-10-CM

## 2021-05-11 DIAGNOSIS — M79675 Pain in left toe(s): Secondary | ICD-10-CM

## 2021-05-11 DIAGNOSIS — L84 Corns and callosities: Secondary | ICD-10-CM

## 2021-05-11 DIAGNOSIS — S90426A Blister (nonthermal), unspecified lesser toe(s), initial encounter: Secondary | ICD-10-CM

## 2021-05-11 DIAGNOSIS — E1142 Type 2 diabetes mellitus with diabetic polyneuropathy: Secondary | ICD-10-CM

## 2021-05-11 DIAGNOSIS — I739 Peripheral vascular disease, unspecified: Secondary | ICD-10-CM
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
## 2021-05-17 DIAGNOSIS — M7062 Trochanteric bursitis, left hip: Secondary | ICD-10-CM | POA: Diagnosis not present

## 2021-05-17 DIAGNOSIS — S7002XA Contusion of left hip, initial encounter: Secondary | ICD-10-CM | POA: Diagnosis not present

## 2021-05-25 DIAGNOSIS — G4733 Obstructive sleep apnea (adult) (pediatric): Secondary | ICD-10-CM | POA: Diagnosis not present

## 2021-05-31 DIAGNOSIS — M7062 Trochanteric bursitis, left hip: Secondary | ICD-10-CM | POA: Diagnosis not present

## 2021-06-08 DIAGNOSIS — I10 Essential (primary) hypertension: Secondary | ICD-10-CM | POA: Diagnosis not present

## 2021-06-08 DIAGNOSIS — M4316 Spondylolisthesis, lumbar region: Secondary | ICD-10-CM | POA: Diagnosis not present

## 2021-06-08 DIAGNOSIS — M5416 Radiculopathy, lumbar region: Secondary | ICD-10-CM | POA: Diagnosis not present

## 2021-07-06 DIAGNOSIS — E785 Hyperlipidemia, unspecified: Secondary | ICD-10-CM | POA: Diagnosis not present

## 2021-07-06 DIAGNOSIS — N1832 Chronic kidney disease, stage 3b: Secondary | ICD-10-CM | POA: Diagnosis not present

## 2021-07-06 DIAGNOSIS — N200 Calculus of kidney: Secondary | ICD-10-CM | POA: Diagnosis not present

## 2021-07-06 DIAGNOSIS — M549 Dorsalgia, unspecified: Secondary | ICD-10-CM | POA: Diagnosis not present

## 2021-07-06 DIAGNOSIS — I700 Atherosclerosis of aorta: Secondary | ICD-10-CM | POA: Diagnosis not present

## 2021-07-06 DIAGNOSIS — H6121 Impacted cerumen, right ear: Secondary | ICD-10-CM | POA: Diagnosis not present

## 2021-07-06 DIAGNOSIS — E1165 Type 2 diabetes mellitus with hyperglycemia: Secondary | ICD-10-CM | POA: Diagnosis not present

## 2021-07-06 DIAGNOSIS — Z79899 Other long term (current) drug therapy: Secondary | ICD-10-CM | POA: Diagnosis not present

## 2021-07-18 ENCOUNTER — Ambulatory Visit: Payer: Medicare Other | Admitting: Podiatry

## 2021-07-18 ENCOUNTER — Encounter: Payer: Self-pay | Admitting: Podiatry

## 2021-07-18 DIAGNOSIS — M79675 Pain in left toe(s): Secondary | ICD-10-CM | POA: Diagnosis not present

## 2021-07-18 DIAGNOSIS — I739 Peripheral vascular disease, unspecified: Secondary | ICD-10-CM | POA: Diagnosis not present

## 2021-07-18 DIAGNOSIS — B351 Tinea unguium: Secondary | ICD-10-CM | POA: Diagnosis not present

## 2021-07-18 DIAGNOSIS — L84 Corns and callosities: Secondary | ICD-10-CM | POA: Diagnosis not present

## 2021-07-18 DIAGNOSIS — M79674 Pain in right toe(s): Secondary | ICD-10-CM

## 2021-07-18 DIAGNOSIS — S90426A Blister (nonthermal), unspecified lesser toe(s), initial encounter: Secondary | ICD-10-CM

## 2021-07-18 DIAGNOSIS — E1142 Type 2 diabetes mellitus with diabetic polyneuropathy: Secondary | ICD-10-CM
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
## 2021-07-30 DIAGNOSIS — I739 Peripheral vascular disease, unspecified: Secondary | ICD-10-CM | POA: Diagnosis not present

## 2021-07-30 DIAGNOSIS — S81801A Unspecified open wound, right lower leg, initial encounter: Secondary | ICD-10-CM | POA: Diagnosis not present

## 2021-08-05 DIAGNOSIS — K439 Ventral hernia without obstruction or gangrene: Secondary | ICD-10-CM | POA: Diagnosis not present

## 2021-08-05 DIAGNOSIS — Z743 Need for continuous supervision: Secondary | ICD-10-CM | POA: Diagnosis not present

## 2021-08-05 DIAGNOSIS — R110 Nausea: Secondary | ICD-10-CM | POA: Diagnosis not present

## 2021-08-05 DIAGNOSIS — N289 Disorder of kidney and ureter, unspecified: Secondary | ICD-10-CM | POA: Diagnosis not present

## 2021-08-05 DIAGNOSIS — K3189 Other diseases of stomach and duodenum: Secondary | ICD-10-CM | POA: Diagnosis not present

## 2021-08-05 DIAGNOSIS — K429 Umbilical hernia without obstruction or gangrene: Secondary | ICD-10-CM | POA: Diagnosis not present

## 2021-08-05 DIAGNOSIS — R197 Diarrhea, unspecified: Secondary | ICD-10-CM | POA: Diagnosis not present

## 2021-08-05 DIAGNOSIS — E860 Dehydration: Secondary | ICD-10-CM | POA: Diagnosis not present

## 2021-08-05 DIAGNOSIS — R109 Unspecified abdominal pain: Secondary | ICD-10-CM | POA: Diagnosis not present

## 2021-08-05 DIAGNOSIS — R112 Nausea with vomiting, unspecified: Secondary | ICD-10-CM | POA: Diagnosis not present

## 2021-08-05 DIAGNOSIS — R079 Chest pain, unspecified: Secondary | ICD-10-CM | POA: Diagnosis not present

## 2021-08-05 DIAGNOSIS — K5730 Diverticulosis of large intestine without perforation or abscess without bleeding: Secondary | ICD-10-CM | POA: Diagnosis not present

## 2021-08-05 DIAGNOSIS — R6889 Other general symptoms and signs: Secondary | ICD-10-CM | POA: Diagnosis not present

## 2021-08-09 DIAGNOSIS — L97919 Non-pressure chronic ulcer of unspecified part of right lower leg with unspecified severity: Secondary | ICD-10-CM | POA: Diagnosis not present

## 2021-08-09 DIAGNOSIS — I83018 Varicose veins of right lower extremity with ulcer other part of lower leg: Secondary | ICD-10-CM | POA: Diagnosis not present

## 2021-08-17 DIAGNOSIS — L97919 Non-pressure chronic ulcer of unspecified part of right lower leg with unspecified severity: Secondary | ICD-10-CM | POA: Diagnosis not present

## 2021-08-17 DIAGNOSIS — R030 Elevated blood-pressure reading, without diagnosis of hypertension: Secondary | ICD-10-CM | POA: Diagnosis not present

## 2021-08-17 DIAGNOSIS — I83019 Varicose veins of right lower extremity with ulcer of unspecified site: Secondary | ICD-10-CM | POA: Diagnosis not present

## 2021-08-18 DIAGNOSIS — J019 Acute sinusitis, unspecified: Secondary | ICD-10-CM | POA: Diagnosis not present

## 2021-08-18 DIAGNOSIS — S81801A Unspecified open wound, right lower leg, initial encounter: Secondary | ICD-10-CM | POA: Diagnosis not present

## 2021-08-18 DIAGNOSIS — K429 Umbilical hernia without obstruction or gangrene: Secondary | ICD-10-CM | POA: Diagnosis not present

## 2021-08-18 DIAGNOSIS — R1032 Left lower quadrant pain: Secondary | ICD-10-CM | POA: Diagnosis not present

## 2021-08-18 DIAGNOSIS — L97919 Non-pressure chronic ulcer of unspecified part of right lower leg with unspecified severity: Secondary | ICD-10-CM | POA: Diagnosis not present

## 2021-08-19 DIAGNOSIS — G4733 Obstructive sleep apnea (adult) (pediatric): Secondary | ICD-10-CM | POA: Diagnosis not present

## 2021-08-31 DIAGNOSIS — B0239 Other herpes zoster eye disease: Secondary | ICD-10-CM | POA: Diagnosis not present

## 2021-09-05 DIAGNOSIS — B0239 Other herpes zoster eye disease: Secondary | ICD-10-CM | POA: Diagnosis not present

## 2021-09-07 DIAGNOSIS — R2681 Unsteadiness on feet: Secondary | ICD-10-CM | POA: Diagnosis not present

## 2021-09-07 DIAGNOSIS — I700 Atherosclerosis of aorta: Secondary | ICD-10-CM | POA: Diagnosis not present

## 2021-09-07 DIAGNOSIS — I83019 Varicose veins of right lower extremity with ulcer of unspecified site: Secondary | ICD-10-CM | POA: Diagnosis not present

## 2021-09-07 DIAGNOSIS — E785 Hyperlipidemia, unspecified: Secondary | ICD-10-CM | POA: Diagnosis not present

## 2021-09-07 DIAGNOSIS — E1165 Type 2 diabetes mellitus with hyperglycemia: Secondary | ICD-10-CM | POA: Diagnosis not present

## 2021-09-07 DIAGNOSIS — L97919 Non-pressure chronic ulcer of unspecified part of right lower leg with unspecified severity: Secondary | ICD-10-CM | POA: Diagnosis not present

## 2021-09-07 DIAGNOSIS — R0981 Nasal congestion: Secondary | ICD-10-CM | POA: Diagnosis not present

## 2021-09-07 DIAGNOSIS — N289 Disorder of kidney and ureter, unspecified: Secondary | ICD-10-CM | POA: Diagnosis not present

## 2021-09-07 DIAGNOSIS — M47816 Spondylosis without myelopathy or radiculopathy, lumbar region: Secondary | ICD-10-CM | POA: Diagnosis not present

## 2021-09-19 DIAGNOSIS — M7062 Trochanteric bursitis, left hip: Secondary | ICD-10-CM | POA: Diagnosis not present

## 2021-09-22 ENCOUNTER — Other Ambulatory Visit: Payer: Self-pay | Admitting: Internal Medicine

## 2021-09-22 DIAGNOSIS — Z23 Encounter for immunization: Secondary | ICD-10-CM | POA: Diagnosis not present

## 2021-09-22 DIAGNOSIS — S81011A Laceration without foreign body, right knee, initial encounter: Secondary | ICD-10-CM | POA: Diagnosis not present

## 2021-09-22 DIAGNOSIS — M7989 Other specified soft tissue disorders: Secondary | ICD-10-CM | POA: Diagnosis not present

## 2021-09-22 DIAGNOSIS — N289 Disorder of kidney and ureter, unspecified: Secondary | ICD-10-CM

## 2021-09-23 DIAGNOSIS — S81001A Unspecified open wound, right knee, initial encounter: Secondary | ICD-10-CM | POA: Diagnosis not present

## 2021-10-06 DIAGNOSIS — L84 Corns and callosities: Secondary | ICD-10-CM | POA: Diagnosis not present

## 2021-10-06 DIAGNOSIS — S8991XA Unspecified injury of right lower leg, initial encounter: Secondary | ICD-10-CM | POA: Diagnosis not present

## 2021-10-06 DIAGNOSIS — S81011A Laceration without foreign body, right knee, initial encounter: Secondary | ICD-10-CM | POA: Diagnosis not present

## 2021-10-06 DIAGNOSIS — M79671 Pain in right foot: Secondary | ICD-10-CM | POA: Diagnosis not present

## 2021-10-07 ENCOUNTER — Ambulatory Visit
Admission: RE | Admit: 2021-10-07 | Discharge: 2021-10-07 | Disposition: A | Discharge status: Home / Self Care | Payer: Medicare Other | Source: Ambulatory Visit | Attending: Internal Medicine | Admitting: Internal Medicine

## 2021-10-07 DIAGNOSIS — N281 Cyst of kidney, acquired: Secondary | ICD-10-CM | POA: Diagnosis not present

## 2021-10-07 DIAGNOSIS — K8689 Other specified diseases of pancreas: Secondary | ICD-10-CM | POA: Diagnosis not present

## 2021-10-07 DIAGNOSIS — N2889 Other specified disorders of kidney and ureter: Secondary | ICD-10-CM | POA: Diagnosis not present

## 2021-10-07 DIAGNOSIS — K760 Fatty (change of) liver, not elsewhere classified: Secondary | ICD-10-CM | POA: Diagnosis not present

## 2021-10-07 DIAGNOSIS — N289 Disorder of kidney and ureter, unspecified: Secondary | ICD-10-CM

## 2021-10-07 MED ORDER — GADOBENATE DIMEGLUMINE 529 MG/ML IV SOLN
20.0000 mL | Freq: Once | INTRAVENOUS | Status: AC | PRN
  Administered 2021-10-07: 20 mL via INTRAVENOUS

## 2021-10-12 ENCOUNTER — Ambulatory Visit: Payer: Medicare Other | Admitting: Podiatrist

## 2021-10-18 DIAGNOSIS — S81011A Laceration without foreign body, right knee, initial encounter: Secondary | ICD-10-CM | POA: Diagnosis not present

## 2021-10-18 DIAGNOSIS — R2681 Unsteadiness on feet: Secondary | ICD-10-CM | POA: Diagnosis not present

## 2021-10-19 DIAGNOSIS — E119 Type 2 diabetes mellitus without complications: Secondary | ICD-10-CM | POA: Diagnosis not present

## 2021-10-19 DIAGNOSIS — Z7984 Long term (current) use of oral hypoglycemic drugs: Secondary | ICD-10-CM | POA: Diagnosis not present

## 2021-10-19 DIAGNOSIS — H2513 Age-related nuclear cataract, bilateral: Secondary | ICD-10-CM | POA: Diagnosis not present

## 2021-10-24 DIAGNOSIS — S81011A Laceration without foreign body, right knee, initial encounter: Secondary | ICD-10-CM | POA: Diagnosis not present

## 2021-10-24 DIAGNOSIS — S81801A Unspecified open wound, right lower leg, initial encounter: Secondary | ICD-10-CM | POA: Diagnosis not present

## 2021-10-31 DIAGNOSIS — S81011A Laceration without foreign body, right knee, initial encounter: Secondary | ICD-10-CM | POA: Diagnosis not present

## 2021-11-05 DIAGNOSIS — Z7984 Long term (current) use of oral hypoglycemic drugs: Secondary | ICD-10-CM | POA: Diagnosis not present

## 2021-11-05 DIAGNOSIS — N2889 Other specified disorders of kidney and ureter: Secondary | ICD-10-CM | POA: Diagnosis not present

## 2021-11-05 DIAGNOSIS — Z20822 Contact with and (suspected) exposure to covid-19: Secondary | ICD-10-CM | POA: Diagnosis not present

## 2021-11-05 DIAGNOSIS — K529 Noninfective gastroenteritis and colitis, unspecified: Secondary | ICD-10-CM | POA: Diagnosis not present

## 2021-11-05 DIAGNOSIS — I499 Cardiac arrhythmia, unspecified: Secondary | ICD-10-CM | POA: Diagnosis not present

## 2021-11-05 DIAGNOSIS — N289 Disorder of kidney and ureter, unspecified: Secondary | ICD-10-CM | POA: Diagnosis not present

## 2021-11-05 DIAGNOSIS — R112 Nausea with vomiting, unspecified: Secondary | ICD-10-CM | POA: Diagnosis not present

## 2021-11-05 DIAGNOSIS — Z7982 Long term (current) use of aspirin: Secondary | ICD-10-CM | POA: Diagnosis not present

## 2021-11-05 DIAGNOSIS — R531 Weakness: Secondary | ICD-10-CM | POA: Diagnosis not present

## 2021-11-05 DIAGNOSIS — R1032 Left lower quadrant pain: Secondary | ICD-10-CM | POA: Diagnosis not present

## 2021-11-05 DIAGNOSIS — R109 Unspecified abdominal pain: Secondary | ICD-10-CM | POA: Diagnosis not present

## 2021-11-05 DIAGNOSIS — R519 Headache, unspecified: Secondary | ICD-10-CM | POA: Diagnosis not present

## 2021-11-05 DIAGNOSIS — I10 Essential (primary) hypertension: Secondary | ICD-10-CM | POA: Diagnosis not present

## 2021-11-05 DIAGNOSIS — E7800 Pure hypercholesterolemia, unspecified: Secondary | ICD-10-CM | POA: Diagnosis not present

## 2021-11-05 DIAGNOSIS — Z881 Allergy status to other antibiotic agents status: Secondary | ICD-10-CM | POA: Diagnosis not present

## 2021-11-05 DIAGNOSIS — R1031 Right lower quadrant pain: Secondary | ICD-10-CM | POA: Diagnosis not present

## 2021-11-05 DIAGNOSIS — R197 Diarrhea, unspecified: Secondary | ICD-10-CM | POA: Diagnosis not present

## 2021-11-05 DIAGNOSIS — Z79899 Other long term (current) drug therapy: Secondary | ICD-10-CM | POA: Diagnosis not present

## 2021-11-05 DIAGNOSIS — E119 Type 2 diabetes mellitus without complications: Secondary | ICD-10-CM | POA: Diagnosis not present

## 2021-11-05 DIAGNOSIS — Z743 Need for continuous supervision: Secondary | ICD-10-CM | POA: Diagnosis not present

## 2021-11-12 DIAGNOSIS — R197 Diarrhea, unspecified: Secondary | ICD-10-CM | POA: Diagnosis not present

## 2021-11-12 DIAGNOSIS — R109 Unspecified abdominal pain: Secondary | ICD-10-CM | POA: Diagnosis not present

## 2021-11-12 DIAGNOSIS — R1110 Vomiting, unspecified: Secondary | ICD-10-CM | POA: Diagnosis not present

## 2021-11-27 DIAGNOSIS — J45909 Unspecified asthma, uncomplicated: Secondary | ICD-10-CM | POA: Diagnosis not present

## 2021-11-27 DIAGNOSIS — W19XXXA Unspecified fall, initial encounter: Secondary | ICD-10-CM | POA: Diagnosis not present

## 2021-11-27 DIAGNOSIS — R079 Chest pain, unspecified: Secondary | ICD-10-CM | POA: Diagnosis not present

## 2021-11-27 DIAGNOSIS — Z881 Allergy status to other antibiotic agents status: Secondary | ICD-10-CM | POA: Diagnosis not present

## 2021-11-27 DIAGNOSIS — M6281 Muscle weakness (generalized): Secondary | ICD-10-CM | POA: Diagnosis not present

## 2021-11-27 DIAGNOSIS — R6889 Other general symptoms and signs: Secondary | ICD-10-CM | POA: Diagnosis not present

## 2021-11-27 DIAGNOSIS — Z9181 History of falling: Secondary | ICD-10-CM | POA: Diagnosis not present

## 2021-11-27 DIAGNOSIS — R278 Other lack of coordination: Secondary | ICD-10-CM | POA: Diagnosis not present

## 2021-11-27 DIAGNOSIS — R0902 Hypoxemia: Secondary | ICD-10-CM | POA: Diagnosis not present

## 2021-11-27 DIAGNOSIS — E1122 Type 2 diabetes mellitus with diabetic chronic kidney disease: Secondary | ICD-10-CM | POA: Diagnosis not present

## 2021-11-27 DIAGNOSIS — E785 Hyperlipidemia, unspecified: Secondary | ICD-10-CM | POA: Diagnosis not present

## 2021-11-27 DIAGNOSIS — D631 Anemia in chronic kidney disease: Secondary | ICD-10-CM | POA: Diagnosis not present

## 2021-11-27 DIAGNOSIS — Z792 Long term (current) use of antibiotics: Secondary | ICD-10-CM | POA: Diagnosis not present

## 2021-11-27 DIAGNOSIS — N390 Urinary tract infection, site not specified: Secondary | ICD-10-CM | POA: Diagnosis not present

## 2021-11-27 DIAGNOSIS — E119 Type 2 diabetes mellitus without complications: Secondary | ICD-10-CM | POA: Diagnosis not present

## 2021-11-27 DIAGNOSIS — Z8719 Personal history of other diseases of the digestive system: Secondary | ICD-10-CM | POA: Diagnosis not present

## 2021-11-27 DIAGNOSIS — I129 Hypertensive chronic kidney disease with stage 1 through stage 4 chronic kidney disease, or unspecified chronic kidney disease: Secondary | ICD-10-CM | POA: Diagnosis not present

## 2021-11-27 DIAGNOSIS — B952 Enterococcus as the cause of diseases classified elsewhere: Secondary | ICD-10-CM | POA: Diagnosis not present

## 2021-11-27 DIAGNOSIS — N1832 Chronic kidney disease, stage 3b: Secondary | ICD-10-CM | POA: Diagnosis not present

## 2021-11-27 DIAGNOSIS — Z87442 Personal history of urinary calculi: Secondary | ICD-10-CM | POA: Diagnosis not present

## 2021-11-27 DIAGNOSIS — R1312 Dysphagia, oropharyngeal phase: Secondary | ICD-10-CM | POA: Diagnosis not present

## 2021-11-27 DIAGNOSIS — K219 Gastro-esophageal reflux disease without esophagitis: Secondary | ICD-10-CM | POA: Diagnosis not present

## 2021-11-27 DIAGNOSIS — N179 Acute kidney failure, unspecified: Secondary | ICD-10-CM | POA: Diagnosis not present

## 2021-11-27 DIAGNOSIS — R451 Restlessness and agitation: Secondary | ICD-10-CM | POA: Diagnosis not present

## 2021-11-27 DIAGNOSIS — Z79899 Other long term (current) drug therapy: Secondary | ICD-10-CM | POA: Diagnosis not present

## 2021-11-27 DIAGNOSIS — E876 Hypokalemia: Secondary | ICD-10-CM | POA: Diagnosis not present

## 2021-11-27 DIAGNOSIS — R531 Weakness: Secondary | ICD-10-CM | POA: Diagnosis not present

## 2021-11-27 DIAGNOSIS — R9431 Abnormal electrocardiogram [ECG] [EKG]: Secondary | ICD-10-CM | POA: Diagnosis not present

## 2021-11-27 DIAGNOSIS — Z7401 Bed confinement status: Secondary | ICD-10-CM | POA: Diagnosis not present

## 2021-11-27 DIAGNOSIS — Z7982 Long term (current) use of aspirin: Secondary | ICD-10-CM | POA: Diagnosis not present

## 2021-11-27 DIAGNOSIS — B964 Proteus (mirabilis) (morganii) as the cause of diseases classified elsewhere: Secondary | ICD-10-CM | POA: Diagnosis not present

## 2021-11-27 DIAGNOSIS — Z743 Need for continuous supervision: Secondary | ICD-10-CM | POA: Diagnosis not present

## 2021-11-27 DIAGNOSIS — J9811 Atelectasis: Secondary | ICD-10-CM | POA: Diagnosis not present

## 2021-11-27 DIAGNOSIS — G8929 Other chronic pain: Secondary | ICD-10-CM | POA: Diagnosis not present

## 2021-11-27 DIAGNOSIS — Z794 Long term (current) use of insulin: Secondary | ICD-10-CM | POA: Diagnosis not present

## 2021-11-27 DIAGNOSIS — Z87440 Personal history of urinary (tract) infections: Secondary | ICD-10-CM | POA: Diagnosis not present

## 2021-11-27 DIAGNOSIS — M1990 Unspecified osteoarthritis, unspecified site: Secondary | ICD-10-CM | POA: Diagnosis not present

## 2021-11-27 DIAGNOSIS — M1380 Other specified arthritis, unspecified site: Secondary | ICD-10-CM | POA: Diagnosis not present

## 2021-11-27 DIAGNOSIS — I160 Hypertensive urgency: Secondary | ICD-10-CM | POA: Diagnosis not present

## 2021-11-27 DIAGNOSIS — M6259 Muscle wasting and atrophy, not elsewhere classified, multiple sites: Secondary | ICD-10-CM | POA: Diagnosis not present

## 2021-12-01 DIAGNOSIS — M6281 Muscle weakness (generalized): Secondary | ICD-10-CM | POA: Diagnosis not present

## 2021-12-01 DIAGNOSIS — M1380 Other specified arthritis, unspecified site: Secondary | ICD-10-CM | POA: Diagnosis not present

## 2021-12-01 DIAGNOSIS — Z7401 Bed confinement status: Secondary | ICD-10-CM | POA: Diagnosis not present

## 2021-12-01 DIAGNOSIS — N179 Acute kidney failure, unspecified: Secondary | ICD-10-CM | POA: Diagnosis not present

## 2021-12-01 DIAGNOSIS — E876 Hypokalemia: Secondary | ICD-10-CM | POA: Diagnosis not present

## 2021-12-01 DIAGNOSIS — R1312 Dysphagia, oropharyngeal phase: Secondary | ICD-10-CM | POA: Diagnosis not present

## 2021-12-01 DIAGNOSIS — M25571 Pain in right ankle and joints of right foot: Secondary | ICD-10-CM | POA: Diagnosis not present

## 2021-12-01 DIAGNOSIS — Z87442 Personal history of urinary calculi: Secondary | ICD-10-CM | POA: Diagnosis not present

## 2021-12-01 DIAGNOSIS — Z743 Need for continuous supervision: Secondary | ICD-10-CM | POA: Diagnosis not present

## 2021-12-01 DIAGNOSIS — Z9181 History of falling: Secondary | ICD-10-CM | POA: Diagnosis not present

## 2021-12-01 DIAGNOSIS — K219 Gastro-esophageal reflux disease without esophagitis: Secondary | ICD-10-CM | POA: Diagnosis not present

## 2021-12-01 DIAGNOSIS — E785 Hyperlipidemia, unspecified: Secondary | ICD-10-CM | POA: Diagnosis not present

## 2021-12-01 DIAGNOSIS — R451 Restlessness and agitation: Secondary | ICD-10-CM | POA: Diagnosis not present

## 2021-12-01 DIAGNOSIS — J45901 Unspecified asthma with (acute) exacerbation: Secondary | ICD-10-CM | POA: Diagnosis not present

## 2021-12-01 DIAGNOSIS — R059 Cough, unspecified: Secondary | ICD-10-CM | POA: Diagnosis not present

## 2021-12-01 DIAGNOSIS — M6259 Muscle wasting and atrophy, not elsewhere classified, multiple sites: Secondary | ICD-10-CM | POA: Diagnosis not present

## 2021-12-01 DIAGNOSIS — I10 Essential (primary) hypertension: Secondary | ICD-10-CM | POA: Diagnosis not present

## 2021-12-01 DIAGNOSIS — R278 Other lack of coordination: Secondary | ICD-10-CM | POA: Diagnosis not present

## 2021-12-01 DIAGNOSIS — R531 Weakness: Secondary | ICD-10-CM | POA: Diagnosis not present

## 2021-12-01 DIAGNOSIS — E119 Type 2 diabetes mellitus without complications: Secondary | ICD-10-CM | POA: Diagnosis not present

## 2021-12-01 DIAGNOSIS — R6889 Other general symptoms and signs: Secondary | ICD-10-CM | POA: Diagnosis not present

## 2021-12-01 DIAGNOSIS — G8929 Other chronic pain: Secondary | ICD-10-CM | POA: Diagnosis not present

## 2021-12-01 DIAGNOSIS — N390 Urinary tract infection, site not specified: Secondary | ICD-10-CM | POA: Diagnosis not present

## 2021-12-02 DIAGNOSIS — M6259 Muscle wasting and atrophy, not elsewhere classified, multiple sites: Secondary | ICD-10-CM | POA: Diagnosis not present

## 2021-12-02 DIAGNOSIS — N179 Acute kidney failure, unspecified: Secondary | ICD-10-CM | POA: Diagnosis not present

## 2021-12-02 DIAGNOSIS — I10 Essential (primary) hypertension: Secondary | ICD-10-CM | POA: Diagnosis not present

## 2021-12-02 DIAGNOSIS — N390 Urinary tract infection, site not specified: Secondary | ICD-10-CM | POA: Diagnosis not present

## 2021-12-05 DIAGNOSIS — N390 Urinary tract infection, site not specified: Secondary | ICD-10-CM | POA: Diagnosis not present

## 2021-12-05 DIAGNOSIS — M25571 Pain in right ankle and joints of right foot: Secondary | ICD-10-CM | POA: Diagnosis not present

## 2021-12-05 DIAGNOSIS — N179 Acute kidney failure, unspecified: Secondary | ICD-10-CM | POA: Diagnosis not present

## 2021-12-05 DIAGNOSIS — I10 Essential (primary) hypertension: Secondary | ICD-10-CM | POA: Diagnosis not present

## 2021-12-06 DIAGNOSIS — E119 Type 2 diabetes mellitus without complications: Secondary | ICD-10-CM | POA: Diagnosis not present

## 2021-12-06 DIAGNOSIS — N390 Urinary tract infection, site not specified: Secondary | ICD-10-CM | POA: Diagnosis not present

## 2021-12-06 DIAGNOSIS — N179 Acute kidney failure, unspecified: Secondary | ICD-10-CM | POA: Diagnosis not present

## 2021-12-06 DIAGNOSIS — E785 Hyperlipidemia, unspecified: Secondary | ICD-10-CM | POA: Diagnosis not present

## 2021-12-08 DIAGNOSIS — R059 Cough, unspecified: Secondary | ICD-10-CM | POA: Diagnosis not present

## 2021-12-09 DIAGNOSIS — J45901 Unspecified asthma with (acute) exacerbation: Secondary | ICD-10-CM | POA: Diagnosis not present

## 2021-12-09 DIAGNOSIS — I10 Essential (primary) hypertension: Secondary | ICD-10-CM | POA: Diagnosis not present

## 2021-12-09 DIAGNOSIS — E876 Hypokalemia: Secondary | ICD-10-CM | POA: Diagnosis not present

## 2021-12-15 DIAGNOSIS — R5381 Other malaise: Secondary | ICD-10-CM | POA: Diagnosis not present

## 2021-12-15 DIAGNOSIS — D62 Acute posthemorrhagic anemia: Secondary | ICD-10-CM | POA: Diagnosis not present

## 2021-12-15 DIAGNOSIS — K219 Gastro-esophageal reflux disease without esophagitis: Secondary | ICD-10-CM | POA: Diagnosis not present

## 2021-12-15 DIAGNOSIS — Z9181 History of falling: Secondary | ICD-10-CM | POA: Diagnosis not present

## 2021-12-15 DIAGNOSIS — I2694 Multiple subsegmental pulmonary emboli without acute cor pulmonale: Secondary | ICD-10-CM | POA: Diagnosis not present

## 2021-12-15 DIAGNOSIS — J9611 Chronic respiratory failure with hypoxia: Secondary | ICD-10-CM | POA: Diagnosis not present

## 2021-12-15 DIAGNOSIS — D631 Anemia in chronic kidney disease: Secondary | ICD-10-CM | POA: Diagnosis not present

## 2021-12-15 DIAGNOSIS — I440 Atrioventricular block, first degree: Secondary | ICD-10-CM | POA: Diagnosis not present

## 2021-12-15 DIAGNOSIS — I10 Essential (primary) hypertension: Secondary | ICD-10-CM | POA: Diagnosis not present

## 2021-12-15 DIAGNOSIS — M1380 Other specified arthritis, unspecified site: Secondary | ICD-10-CM | POA: Diagnosis not present

## 2021-12-15 DIAGNOSIS — I2699 Other pulmonary embolism without acute cor pulmonale: Secondary | ICD-10-CM | POA: Diagnosis not present

## 2021-12-15 DIAGNOSIS — N179 Acute kidney failure, unspecified: Secondary | ICD-10-CM | POA: Diagnosis not present

## 2021-12-15 DIAGNOSIS — I129 Hypertensive chronic kidney disease with stage 1 through stage 4 chronic kidney disease, or unspecified chronic kidney disease: Secondary | ICD-10-CM | POA: Diagnosis not present

## 2021-12-15 DIAGNOSIS — R059 Cough, unspecified: Secondary | ICD-10-CM | POA: Diagnosis not present

## 2021-12-15 DIAGNOSIS — R451 Restlessness and agitation: Secondary | ICD-10-CM | POA: Diagnosis not present

## 2021-12-15 DIAGNOSIS — K668 Other specified disorders of peritoneum: Secondary | ICD-10-CM | POA: Diagnosis not present

## 2021-12-15 DIAGNOSIS — M109 Gout, unspecified: Secondary | ICD-10-CM | POA: Diagnosis not present

## 2021-12-15 DIAGNOSIS — Z87440 Personal history of urinary (tract) infections: Secondary | ICD-10-CM | POA: Diagnosis not present

## 2021-12-15 DIAGNOSIS — Z9989 Dependence on other enabling machines and devices: Secondary | ICD-10-CM | POA: Diagnosis not present

## 2021-12-15 DIAGNOSIS — Z743 Need for continuous supervision: Secondary | ICD-10-CM | POA: Diagnosis not present

## 2021-12-15 DIAGNOSIS — R609 Edema, unspecified: Secondary | ICD-10-CM | POA: Diagnosis not present

## 2021-12-15 DIAGNOSIS — N390 Urinary tract infection, site not specified: Secondary | ICD-10-CM | POA: Diagnosis not present

## 2021-12-15 DIAGNOSIS — Z7401 Bed confinement status: Secondary | ICD-10-CM | POA: Diagnosis not present

## 2021-12-15 DIAGNOSIS — E785 Hyperlipidemia, unspecified: Secondary | ICD-10-CM | POA: Diagnosis not present

## 2021-12-15 DIAGNOSIS — M1990 Unspecified osteoarthritis, unspecified site: Secondary | ICD-10-CM | POA: Diagnosis not present

## 2021-12-15 DIAGNOSIS — Z87442 Personal history of urinary calculi: Secondary | ICD-10-CM | POA: Diagnosis not present

## 2021-12-15 DIAGNOSIS — C188 Malignant neoplasm of overlapping sites of colon: Secondary | ICD-10-CM | POA: Diagnosis not present

## 2021-12-15 DIAGNOSIS — R531 Weakness: Secondary | ICD-10-CM | POA: Diagnosis not present

## 2021-12-15 DIAGNOSIS — K631 Perforation of intestine (nontraumatic): Secondary | ICD-10-CM | POA: Diagnosis not present

## 2021-12-15 DIAGNOSIS — Z9911 Dependence on respirator [ventilator] status: Secondary | ICD-10-CM | POA: Diagnosis not present

## 2021-12-15 DIAGNOSIS — J45909 Unspecified asthma, uncomplicated: Secondary | ICD-10-CM | POA: Diagnosis not present

## 2021-12-15 DIAGNOSIS — R918 Other nonspecific abnormal finding of lung field: Secondary | ICD-10-CM | POA: Diagnosis not present

## 2021-12-15 DIAGNOSIS — R001 Bradycardia, unspecified: Secondary | ICD-10-CM | POA: Diagnosis not present

## 2021-12-15 DIAGNOSIS — U071 COVID-19: Secondary | ICD-10-CM | POA: Diagnosis not present

## 2021-12-15 DIAGNOSIS — R1312 Dysphagia, oropharyngeal phase: Secondary | ICD-10-CM | POA: Diagnosis not present

## 2021-12-15 DIAGNOSIS — E7800 Pure hypercholesterolemia, unspecified: Secondary | ICD-10-CM | POA: Diagnosis not present

## 2021-12-15 DIAGNOSIS — J9601 Acute respiratory failure with hypoxia: Secondary | ICD-10-CM | POA: Diagnosis not present

## 2021-12-15 DIAGNOSIS — M6259 Muscle wasting and atrophy, not elsewhere classified, multiple sites: Secondary | ICD-10-CM | POA: Diagnosis not present

## 2021-12-15 DIAGNOSIS — K921 Melena: Secondary | ICD-10-CM | POA: Diagnosis not present

## 2021-12-15 DIAGNOSIS — J9600 Acute respiratory failure, unspecified whether with hypoxia or hypercapnia: Secondary | ICD-10-CM | POA: Diagnosis not present

## 2021-12-15 DIAGNOSIS — Z452 Encounter for adjustment and management of vascular access device: Secondary | ICD-10-CM | POA: Diagnosis not present

## 2021-12-15 DIAGNOSIS — J90 Pleural effusion, not elsewhere classified: Secondary | ICD-10-CM | POA: Diagnosis not present

## 2021-12-15 DIAGNOSIS — E1122 Type 2 diabetes mellitus with diabetic chronic kidney disease: Secondary | ICD-10-CM | POA: Diagnosis not present

## 2021-12-15 DIAGNOSIS — J9690 Respiratory failure, unspecified, unspecified whether with hypoxia or hypercapnia: Secondary | ICD-10-CM | POA: Diagnosis not present

## 2021-12-15 DIAGNOSIS — M6281 Muscle weakness (generalized): Secondary | ICD-10-CM | POA: Diagnosis not present

## 2021-12-15 DIAGNOSIS — J9602 Acute respiratory failure with hypercapnia: Secondary | ICD-10-CM | POA: Diagnosis not present

## 2021-12-15 DIAGNOSIS — J9811 Atelectasis: Secondary | ICD-10-CM | POA: Diagnosis not present

## 2021-12-15 DIAGNOSIS — I358 Other nonrheumatic aortic valve disorders: Secondary | ICD-10-CM | POA: Diagnosis not present

## 2021-12-15 DIAGNOSIS — Z881 Allergy status to other antibiotic agents status: Secondary | ICD-10-CM | POA: Diagnosis not present

## 2021-12-15 DIAGNOSIS — N1832 Chronic kidney disease, stage 3b: Secondary | ICD-10-CM | POA: Diagnosis not present

## 2021-12-15 DIAGNOSIS — I5030 Unspecified diastolic (congestive) heart failure: Secondary | ICD-10-CM | POA: Diagnosis not present

## 2021-12-15 DIAGNOSIS — R069 Unspecified abnormalities of breathing: Secondary | ICD-10-CM | POA: Diagnosis not present

## 2021-12-15 DIAGNOSIS — Z0389 Encounter for observation for other suspected diseases and conditions ruled out: Secondary | ICD-10-CM | POA: Diagnosis not present

## 2021-12-15 DIAGNOSIS — E876 Hypokalemia: Secondary | ICD-10-CM | POA: Diagnosis not present

## 2021-12-15 DIAGNOSIS — J40 Bronchitis, not specified as acute or chronic: Secondary | ICD-10-CM | POA: Diagnosis not present

## 2021-12-15 DIAGNOSIS — R278 Other lack of coordination: Secondary | ICD-10-CM | POA: Diagnosis not present

## 2021-12-15 DIAGNOSIS — Z9981 Dependence on supplemental oxygen: Secondary | ICD-10-CM | POA: Diagnosis not present

## 2021-12-15 DIAGNOSIS — E119 Type 2 diabetes mellitus without complications: Secondary | ICD-10-CM | POA: Diagnosis not present

## 2021-12-15 DIAGNOSIS — S8012XA Contusion of left lower leg, initial encounter: Secondary | ICD-10-CM | POA: Diagnosis not present

## 2021-12-15 DIAGNOSIS — G8929 Other chronic pain: Secondary | ICD-10-CM | POA: Diagnosis not present

## 2021-12-15 DIAGNOSIS — C799 Secondary malignant neoplasm of unspecified site: Secondary | ICD-10-CM | POA: Diagnosis not present

## 2021-12-16 DIAGNOSIS — I440 Atrioventricular block, first degree: Secondary | ICD-10-CM | POA: Diagnosis not present

## 2021-12-16 DIAGNOSIS — I2699 Other pulmonary embolism without acute cor pulmonale: Secondary | ICD-10-CM

## 2021-12-16 DIAGNOSIS — R001 Bradycardia, unspecified: Secondary | ICD-10-CM

## 2021-12-16 DIAGNOSIS — U071 COVID-19: Secondary | ICD-10-CM

## 2021-12-30 DIAGNOSIS — R0902 Hypoxemia: Secondary | ICD-10-CM | POA: Diagnosis not present

## 2021-12-30 DIAGNOSIS — A419 Sepsis, unspecified organism: Secondary | ICD-10-CM | POA: Diagnosis not present

## 2021-12-30 DIAGNOSIS — M79652 Pain in left thigh: Secondary | ICD-10-CM | POA: Diagnosis not present

## 2021-12-30 DIAGNOSIS — J9811 Atelectasis: Secondary | ICD-10-CM | POA: Diagnosis not present

## 2021-12-30 DIAGNOSIS — Z1611 Resistance to penicillins: Secondary | ICD-10-CM | POA: Diagnosis not present

## 2021-12-30 DIAGNOSIS — R404 Transient alteration of awareness: Secondary | ICD-10-CM | POA: Diagnosis not present

## 2021-12-30 DIAGNOSIS — R609 Edema, unspecified: Secondary | ICD-10-CM | POA: Diagnosis not present

## 2021-12-30 DIAGNOSIS — U071 COVID-19: Secondary | ICD-10-CM | POA: Diagnosis not present

## 2021-12-30 DIAGNOSIS — R069 Unspecified abnormalities of breathing: Secondary | ICD-10-CM | POA: Diagnosis not present

## 2021-12-30 DIAGNOSIS — N170 Acute kidney failure with tubular necrosis: Secondary | ICD-10-CM | POA: Diagnosis not present

## 2021-12-30 DIAGNOSIS — R1312 Dysphagia, oropharyngeal phase: Secondary | ICD-10-CM | POA: Diagnosis not present

## 2021-12-30 DIAGNOSIS — R531 Weakness: Secondary | ICD-10-CM | POA: Diagnosis not present

## 2021-12-30 DIAGNOSIS — I10 Essential (primary) hypertension: Secondary | ICD-10-CM | POA: Diagnosis not present

## 2021-12-30 DIAGNOSIS — B3749 Other urogenital candidiasis: Secondary | ICD-10-CM | POA: Diagnosis not present

## 2021-12-30 DIAGNOSIS — B964 Proteus (mirabilis) (morganii) as the cause of diseases classified elsewhere: Secondary | ICD-10-CM | POA: Diagnosis not present

## 2021-12-30 DIAGNOSIS — M79662 Pain in left lower leg: Secondary | ICD-10-CM | POA: Diagnosis not present

## 2021-12-30 DIAGNOSIS — Z1623 Resistance to quinolones and fluoroquinolones: Secondary | ICD-10-CM | POA: Diagnosis not present

## 2021-12-30 DIAGNOSIS — R062 Wheezing: Secondary | ICD-10-CM | POA: Diagnosis not present

## 2021-12-30 DIAGNOSIS — E7800 Pure hypercholesterolemia, unspecified: Secondary | ICD-10-CM | POA: Diagnosis not present

## 2021-12-30 DIAGNOSIS — S8012XA Contusion of left lower leg, initial encounter: Secondary | ICD-10-CM | POA: Diagnosis not present

## 2021-12-30 DIAGNOSIS — D62 Acute posthemorrhagic anemia: Secondary | ICD-10-CM | POA: Diagnosis not present

## 2021-12-30 DIAGNOSIS — M109 Gout, unspecified: Secondary | ICD-10-CM | POA: Diagnosis not present

## 2021-12-30 DIAGNOSIS — M1380 Other specified arthritis, unspecified site: Secondary | ICD-10-CM | POA: Diagnosis not present

## 2021-12-30 DIAGNOSIS — L89150 Pressure ulcer of sacral region, unstageable: Secondary | ICD-10-CM | POA: Diagnosis not present

## 2021-12-30 DIAGNOSIS — I2699 Other pulmonary embolism without acute cor pulmonale: Secondary | ICD-10-CM | POA: Diagnosis not present

## 2021-12-30 DIAGNOSIS — R451 Restlessness and agitation: Secondary | ICD-10-CM | POA: Diagnosis not present

## 2021-12-30 DIAGNOSIS — J9611 Chronic respiratory failure with hypoxia: Secondary | ICD-10-CM | POA: Diagnosis not present

## 2021-12-30 DIAGNOSIS — D649 Anemia, unspecified: Secondary | ICD-10-CM | POA: Diagnosis not present

## 2021-12-30 DIAGNOSIS — Z87442 Personal history of urinary calculi: Secondary | ICD-10-CM | POA: Diagnosis not present

## 2021-12-30 DIAGNOSIS — E039 Hypothyroidism, unspecified: Secondary | ICD-10-CM | POA: Diagnosis not present

## 2021-12-30 DIAGNOSIS — R296 Repeated falls: Secondary | ICD-10-CM | POA: Diagnosis not present

## 2021-12-30 DIAGNOSIS — G8929 Other chronic pain: Secondary | ICD-10-CM | POA: Diagnosis not present

## 2021-12-30 DIAGNOSIS — I499 Cardiac arrhythmia, unspecified: Secondary | ICD-10-CM | POA: Diagnosis not present

## 2021-12-30 DIAGNOSIS — M6281 Muscle weakness (generalized): Secondary | ICD-10-CM | POA: Diagnosis not present

## 2021-12-30 DIAGNOSIS — M1990 Unspecified osteoarthritis, unspecified site: Secondary | ICD-10-CM | POA: Diagnosis not present

## 2021-12-30 DIAGNOSIS — J9621 Acute and chronic respiratory failure with hypoxia: Secondary | ICD-10-CM | POA: Diagnosis not present

## 2021-12-30 DIAGNOSIS — Z9181 History of falling: Secondary | ICD-10-CM | POA: Diagnosis not present

## 2021-12-30 DIAGNOSIS — Z743 Need for continuous supervision: Secondary | ICD-10-CM | POA: Diagnosis not present

## 2021-12-30 DIAGNOSIS — K219 Gastro-esophageal reflux disease without esophagitis: Secondary | ICD-10-CM | POA: Diagnosis not present

## 2021-12-30 DIAGNOSIS — N3001 Acute cystitis with hematuria: Secondary | ICD-10-CM | POA: Diagnosis not present

## 2021-12-30 DIAGNOSIS — E785 Hyperlipidemia, unspecified: Secondary | ICD-10-CM | POA: Diagnosis not present

## 2021-12-30 DIAGNOSIS — R278 Other lack of coordination: Secondary | ICD-10-CM | POA: Diagnosis not present

## 2021-12-30 DIAGNOSIS — J45909 Unspecified asthma, uncomplicated: Secondary | ICD-10-CM | POA: Diagnosis not present

## 2021-12-30 DIAGNOSIS — J9602 Acute respiratory failure with hypercapnia: Secondary | ICD-10-CM | POA: Diagnosis not present

## 2021-12-30 DIAGNOSIS — N390 Urinary tract infection, site not specified: Secondary | ICD-10-CM | POA: Diagnosis not present

## 2021-12-30 DIAGNOSIS — E119 Type 2 diabetes mellitus without complications: Secondary | ICD-10-CM | POA: Diagnosis not present

## 2021-12-30 DIAGNOSIS — K5730 Diverticulosis of large intestine without perforation or abscess without bleeding: Secondary | ICD-10-CM | POA: Diagnosis not present

## 2021-12-30 DIAGNOSIS — R9431 Abnormal electrocardiogram [ECG] [EKG]: Secondary | ICD-10-CM | POA: Diagnosis not present

## 2021-12-30 DIAGNOSIS — E876 Hypokalemia: Secondary | ICD-10-CM | POA: Diagnosis not present

## 2021-12-30 DIAGNOSIS — N179 Acute kidney failure, unspecified: Secondary | ICD-10-CM | POA: Diagnosis not present

## 2021-12-30 DIAGNOSIS — M6259 Muscle wasting and atrophy, not elsewhere classified, multiple sites: Secondary | ICD-10-CM | POA: Diagnosis not present

## 2021-12-30 DIAGNOSIS — Z7401 Bed confinement status: Secondary | ICD-10-CM | POA: Diagnosis not present

## 2021-12-30 DIAGNOSIS — R0603 Acute respiratory distress: Secondary | ICD-10-CM | POA: Diagnosis not present

## 2021-12-30 DIAGNOSIS — I440 Atrioventricular block, first degree: Secondary | ICD-10-CM | POA: Diagnosis not present

## 2021-12-30 DIAGNOSIS — I493 Ventricular premature depolarization: Secondary | ICD-10-CM | POA: Diagnosis not present

## 2021-12-31 DIAGNOSIS — D62 Acute posthemorrhagic anemia: Secondary | ICD-10-CM | POA: Diagnosis not present

## 2021-12-31 DIAGNOSIS — U071 COVID-19: Secondary | ICD-10-CM | POA: Diagnosis not present

## 2021-12-31 DIAGNOSIS — I2699 Other pulmonary embolism without acute cor pulmonale: Secondary | ICD-10-CM | POA: Diagnosis not present

## 2021-12-31 DIAGNOSIS — J9602 Acute respiratory failure with hypercapnia: Secondary | ICD-10-CM | POA: Diagnosis not present

## 2022-01-02 DIAGNOSIS — I2699 Other pulmonary embolism without acute cor pulmonale: Secondary | ICD-10-CM | POA: Diagnosis not present

## 2022-01-02 DIAGNOSIS — E119 Type 2 diabetes mellitus without complications: Secondary | ICD-10-CM | POA: Diagnosis not present

## 2022-01-02 DIAGNOSIS — J9602 Acute respiratory failure with hypercapnia: Secondary | ICD-10-CM | POA: Diagnosis not present

## 2022-01-02 DIAGNOSIS — D62 Acute posthemorrhagic anemia: Secondary | ICD-10-CM | POA: Diagnosis not present

## 2022-01-03 DIAGNOSIS — E119 Type 2 diabetes mellitus without complications: Secondary | ICD-10-CM | POA: Diagnosis not present

## 2022-01-03 DIAGNOSIS — J9602 Acute respiratory failure with hypercapnia: Secondary | ICD-10-CM | POA: Diagnosis not present

## 2022-01-03 DIAGNOSIS — U071 COVID-19: Secondary | ICD-10-CM | POA: Diagnosis not present

## 2022-01-03 DIAGNOSIS — M6259 Muscle wasting and atrophy, not elsewhere classified, multiple sites: Secondary | ICD-10-CM | POA: Diagnosis not present

## 2022-01-07 DIAGNOSIS — N179 Acute kidney failure, unspecified: Secondary | ICD-10-CM | POA: Diagnosis not present

## 2022-01-07 DIAGNOSIS — R0902 Hypoxemia: Secondary | ICD-10-CM | POA: Diagnosis not present

## 2022-01-07 DIAGNOSIS — Z9181 History of falling: Secondary | ICD-10-CM | POA: Diagnosis not present

## 2022-01-07 DIAGNOSIS — Z452 Encounter for adjustment and management of vascular access device: Secondary | ICD-10-CM | POA: Diagnosis not present

## 2022-01-07 DIAGNOSIS — J9611 Chronic respiratory failure with hypoxia: Secondary | ICD-10-CM | POA: Diagnosis not present

## 2022-01-07 DIAGNOSIS — Z1611 Resistance to penicillins: Secondary | ICD-10-CM | POA: Diagnosis not present

## 2022-01-07 DIAGNOSIS — I440 Atrioventricular block, first degree: Secondary | ICD-10-CM | POA: Diagnosis not present

## 2022-01-07 DIAGNOSIS — N170 Acute kidney failure with tubular necrosis: Secondary | ICD-10-CM | POA: Diagnosis not present

## 2022-01-07 DIAGNOSIS — S8012XA Contusion of left lower leg, initial encounter: Secondary | ICD-10-CM | POA: Diagnosis not present

## 2022-01-07 DIAGNOSIS — Z87442 Personal history of urinary calculi: Secondary | ICD-10-CM | POA: Diagnosis not present

## 2022-01-07 DIAGNOSIS — B964 Proteus (mirabilis) (morganii) as the cause of diseases classified elsewhere: Secondary | ICD-10-CM | POA: Diagnosis not present

## 2022-01-07 DIAGNOSIS — E785 Hyperlipidemia, unspecified: Secondary | ICD-10-CM | POA: Diagnosis not present

## 2022-01-07 DIAGNOSIS — R062 Wheezing: Secondary | ICD-10-CM | POA: Diagnosis not present

## 2022-01-07 DIAGNOSIS — I493 Ventricular premature depolarization: Secondary | ICD-10-CM | POA: Diagnosis not present

## 2022-01-07 DIAGNOSIS — J9811 Atelectasis: Secondary | ICD-10-CM | POA: Diagnosis not present

## 2022-01-07 DIAGNOSIS — R0603 Acute respiratory distress: Secondary | ICD-10-CM | POA: Diagnosis not present

## 2022-01-07 DIAGNOSIS — Z7401 Bed confinement status: Secondary | ICD-10-CM | POA: Diagnosis not present

## 2022-01-07 DIAGNOSIS — G8929 Other chronic pain: Secondary | ICD-10-CM | POA: Diagnosis not present

## 2022-01-07 DIAGNOSIS — E039 Hypothyroidism, unspecified: Secondary | ICD-10-CM | POA: Diagnosis not present

## 2022-01-07 DIAGNOSIS — M6281 Muscle weakness (generalized): Secondary | ICD-10-CM | POA: Diagnosis not present

## 2022-01-07 DIAGNOSIS — Z743 Need for continuous supervision: Secondary | ICD-10-CM | POA: Diagnosis not present

## 2022-01-07 DIAGNOSIS — R278 Other lack of coordination: Secondary | ICD-10-CM | POA: Diagnosis not present

## 2022-01-07 DIAGNOSIS — R296 Repeated falls: Secondary | ICD-10-CM | POA: Diagnosis not present

## 2022-01-07 DIAGNOSIS — J9621 Acute and chronic respiratory failure with hypoxia: Secondary | ICD-10-CM | POA: Diagnosis not present

## 2022-01-07 DIAGNOSIS — R1312 Dysphagia, oropharyngeal phase: Secondary | ICD-10-CM | POA: Diagnosis not present

## 2022-01-07 DIAGNOSIS — E7800 Pure hypercholesterolemia, unspecified: Secondary | ICD-10-CM | POA: Diagnosis not present

## 2022-01-07 DIAGNOSIS — N390 Urinary tract infection, site not specified: Secondary | ICD-10-CM | POA: Diagnosis not present

## 2022-01-07 DIAGNOSIS — B3749 Other urogenital candidiasis: Secondary | ICD-10-CM | POA: Diagnosis not present

## 2022-01-07 DIAGNOSIS — I499 Cardiac arrhythmia, unspecified: Secondary | ICD-10-CM | POA: Diagnosis not present

## 2022-01-07 DIAGNOSIS — E119 Type 2 diabetes mellitus without complications: Secondary | ICD-10-CM | POA: Diagnosis not present

## 2022-01-07 DIAGNOSIS — Z1623 Resistance to quinolones and fluoroquinolones: Secondary | ICD-10-CM | POA: Diagnosis not present

## 2022-01-07 DIAGNOSIS — M1380 Other specified arthritis, unspecified site: Secondary | ICD-10-CM | POA: Diagnosis not present

## 2022-01-07 DIAGNOSIS — J45909 Unspecified asthma, uncomplicated: Secondary | ICD-10-CM | POA: Diagnosis not present

## 2022-01-07 DIAGNOSIS — K219 Gastro-esophageal reflux disease without esophagitis: Secondary | ICD-10-CM | POA: Diagnosis not present

## 2022-01-07 DIAGNOSIS — R404 Transient alteration of awareness: Secondary | ICD-10-CM | POA: Diagnosis not present

## 2022-01-07 DIAGNOSIS — I10 Essential (primary) hypertension: Secondary | ICD-10-CM | POA: Diagnosis not present

## 2022-01-07 DIAGNOSIS — R451 Restlessness and agitation: Secondary | ICD-10-CM | POA: Diagnosis not present

## 2022-01-07 DIAGNOSIS — A419 Sepsis, unspecified organism: Secondary | ICD-10-CM | POA: Diagnosis not present

## 2022-01-07 DIAGNOSIS — L97923 Non-pressure chronic ulcer of unspecified part of left lower leg with necrosis of muscle: Secondary | ICD-10-CM | POA: Diagnosis not present

## 2022-01-07 DIAGNOSIS — M109 Gout, unspecified: Secondary | ICD-10-CM | POA: Diagnosis not present

## 2022-01-07 DIAGNOSIS — R9431 Abnormal electrocardiogram [ECG] [EKG]: Secondary | ICD-10-CM | POA: Diagnosis not present

## 2022-01-07 DIAGNOSIS — E876 Hypokalemia: Secondary | ICD-10-CM | POA: Diagnosis not present

## 2022-01-07 DIAGNOSIS — D649 Anemia, unspecified: Secondary | ICD-10-CM | POA: Diagnosis not present

## 2022-01-07 DIAGNOSIS — N3001 Acute cystitis with hematuria: Secondary | ICD-10-CM | POA: Diagnosis not present

## 2022-01-07 DIAGNOSIS — K5730 Diverticulosis of large intestine without perforation or abscess without bleeding: Secondary | ICD-10-CM | POA: Diagnosis not present

## 2022-01-07 DIAGNOSIS — L89150 Pressure ulcer of sacral region, unstageable: Secondary | ICD-10-CM | POA: Diagnosis not present

## 2022-01-07 DIAGNOSIS — I2699 Other pulmonary embolism without acute cor pulmonale: Secondary | ICD-10-CM | POA: Diagnosis not present

## 2022-01-07 DIAGNOSIS — M1990 Unspecified osteoarthritis, unspecified site: Secondary | ICD-10-CM | POA: Diagnosis not present

## 2022-01-07 DIAGNOSIS — R531 Weakness: Secondary | ICD-10-CM | POA: Diagnosis not present

## 2022-01-07 DIAGNOSIS — M6259 Muscle wasting and atrophy, not elsewhere classified, multiple sites: Secondary | ICD-10-CM | POA: Diagnosis not present

## 2022-01-09 DIAGNOSIS — I440 Atrioventricular block, first degree: Secondary | ICD-10-CM

## 2022-01-10 DIAGNOSIS — L89150 Pressure ulcer of sacral region, unstageable: Secondary | ICD-10-CM | POA: Diagnosis not present

## 2022-01-10 DIAGNOSIS — S8012XA Contusion of left lower leg, initial encounter: Secondary | ICD-10-CM | POA: Diagnosis not present

## 2022-01-11 DIAGNOSIS — A419 Sepsis, unspecified organism: Secondary | ICD-10-CM | POA: Diagnosis not present

## 2022-01-11 DIAGNOSIS — N170 Acute kidney failure with tubular necrosis: Secondary | ICD-10-CM | POA: Diagnosis not present

## 2022-01-11 DIAGNOSIS — I493 Ventricular premature depolarization: Secondary | ICD-10-CM

## 2022-01-11 DIAGNOSIS — Z452 Encounter for adjustment and management of vascular access device: Secondary | ICD-10-CM | POA: Diagnosis not present

## 2022-01-12 DIAGNOSIS — L97923 Non-pressure chronic ulcer of unspecified part of left lower leg with necrosis of muscle: Secondary | ICD-10-CM | POA: Diagnosis not present

## 2022-01-12 DIAGNOSIS — S8012XA Contusion of left lower leg, initial encounter: Secondary | ICD-10-CM | POA: Diagnosis not present

## 2022-01-12 DIAGNOSIS — I10 Essential (primary) hypertension: Secondary | ICD-10-CM | POA: Diagnosis not present

## 2022-01-12 DIAGNOSIS — A419 Sepsis, unspecified organism: Secondary | ICD-10-CM | POA: Diagnosis not present

## 2022-01-12 DIAGNOSIS — N170 Acute kidney failure with tubular necrosis: Secondary | ICD-10-CM | POA: Diagnosis not present

## 2022-01-13 DIAGNOSIS — N170 Acute kidney failure with tubular necrosis: Secondary | ICD-10-CM | POA: Diagnosis not present

## 2022-01-13 DIAGNOSIS — A419 Sepsis, unspecified organism: Secondary | ICD-10-CM | POA: Diagnosis not present

## 2022-01-14 DIAGNOSIS — A419 Sepsis, unspecified organism: Secondary | ICD-10-CM | POA: Diagnosis not present

## 2022-01-14 DIAGNOSIS — N170 Acute kidney failure with tubular necrosis: Secondary | ICD-10-CM | POA: Diagnosis not present

## 2022-01-15 DIAGNOSIS — A419 Sepsis, unspecified organism: Secondary | ICD-10-CM | POA: Diagnosis not present

## 2022-01-15 DIAGNOSIS — N170 Acute kidney failure with tubular necrosis: Secondary | ICD-10-CM | POA: Diagnosis not present

## 2022-01-16 DIAGNOSIS — A419 Sepsis, unspecified organism: Secondary | ICD-10-CM | POA: Diagnosis not present

## 2022-01-16 DIAGNOSIS — N170 Acute kidney failure with tubular necrosis: Secondary | ICD-10-CM | POA: Diagnosis not present

## 2022-01-17 DIAGNOSIS — E1142 Type 2 diabetes mellitus with diabetic polyneuropathy: Secondary | ICD-10-CM | POA: Diagnosis not present

## 2022-01-17 DIAGNOSIS — R5383 Other fatigue: Secondary | ICD-10-CM | POA: Diagnosis not present

## 2022-01-17 DIAGNOSIS — Z20822 Contact with and (suspected) exposure to covid-19: Secondary | ICD-10-CM | POA: Diagnosis not present

## 2022-01-17 DIAGNOSIS — N3090 Cystitis, unspecified without hematuria: Secondary | ICD-10-CM | POA: Diagnosis not present

## 2022-01-17 DIAGNOSIS — J9601 Acute respiratory failure with hypoxia: Secondary | ICD-10-CM | POA: Diagnosis not present

## 2022-01-17 DIAGNOSIS — Z515 Encounter for palliative care: Secondary | ICD-10-CM | POA: Diagnosis not present

## 2022-01-17 DIAGNOSIS — Z794 Long term (current) use of insulin: Secondary | ICD-10-CM | POA: Diagnosis not present

## 2022-01-17 DIAGNOSIS — N1831 Chronic kidney disease, stage 3a: Secondary | ICD-10-CM | POA: Diagnosis not present

## 2022-01-17 DIAGNOSIS — R6521 Severe sepsis with septic shock: Secondary | ICD-10-CM | POA: Diagnosis not present

## 2022-01-17 DIAGNOSIS — J690 Pneumonitis due to inhalation of food and vomit: Secondary | ICD-10-CM | POA: Diagnosis not present

## 2022-01-17 DIAGNOSIS — N179 Acute kidney failure, unspecified: Secondary | ICD-10-CM | POA: Diagnosis not present

## 2022-01-17 DIAGNOSIS — M6281 Muscle weakness (generalized): Secondary | ICD-10-CM | POA: Diagnosis not present

## 2022-01-17 DIAGNOSIS — E1122 Type 2 diabetes mellitus with diabetic chronic kidney disease: Secondary | ICD-10-CM | POA: Diagnosis not present

## 2022-01-17 DIAGNOSIS — Z743 Need for continuous supervision: Secondary | ICD-10-CM | POA: Diagnosis not present

## 2022-01-17 DIAGNOSIS — R6520 Severe sepsis without septic shock: Secondary | ICD-10-CM | POA: Diagnosis not present

## 2022-01-17 DIAGNOSIS — E119 Type 2 diabetes mellitus without complications: Secondary | ICD-10-CM | POA: Diagnosis not present

## 2022-01-17 DIAGNOSIS — Z66 Do not resuscitate: Secondary | ICD-10-CM | POA: Diagnosis not present

## 2022-01-17 DIAGNOSIS — J9611 Chronic respiratory failure with hypoxia: Secondary | ICD-10-CM | POA: Diagnosis not present

## 2022-01-17 DIAGNOSIS — R278 Other lack of coordination: Secondary | ICD-10-CM | POA: Diagnosis not present

## 2022-01-17 DIAGNOSIS — Z79899 Other long term (current) drug therapy: Secondary | ICD-10-CM | POA: Diagnosis not present

## 2022-01-17 DIAGNOSIS — Z8616 Personal history of COVID-19: Secondary | ICD-10-CM | POA: Diagnosis not present

## 2022-01-17 DIAGNOSIS — Z7401 Bed confinement status: Secondary | ICD-10-CM | POA: Diagnosis not present

## 2022-01-17 DIAGNOSIS — M109 Gout, unspecified: Secondary | ICD-10-CM | POA: Diagnosis not present

## 2022-01-17 DIAGNOSIS — S8012XS Contusion of left lower leg, sequela: Secondary | ICD-10-CM | POA: Diagnosis not present

## 2022-01-17 DIAGNOSIS — L89150 Pressure ulcer of sacral region, unstageable: Secondary | ICD-10-CM | POA: Diagnosis not present

## 2022-01-17 DIAGNOSIS — J9600 Acute respiratory failure, unspecified whether with hypoxia or hypercapnia: Secondary | ICD-10-CM | POA: Diagnosis not present

## 2022-01-17 DIAGNOSIS — N170 Acute kidney failure with tubular necrosis: Secondary | ICD-10-CM | POA: Diagnosis not present

## 2022-01-17 DIAGNOSIS — I129 Hypertensive chronic kidney disease with stage 1 through stage 4 chronic kidney disease, or unspecified chronic kidney disease: Secondary | ICD-10-CM | POA: Diagnosis not present

## 2022-01-17 DIAGNOSIS — I10 Essential (primary) hypertension: Secondary | ICD-10-CM | POA: Diagnosis not present

## 2022-01-17 DIAGNOSIS — G8929 Other chronic pain: Secondary | ICD-10-CM | POA: Diagnosis not present

## 2022-01-17 DIAGNOSIS — S8012XA Contusion of left lower leg, initial encounter: Secondary | ICD-10-CM | POA: Diagnosis not present

## 2022-01-17 DIAGNOSIS — R451 Restlessness and agitation: Secondary | ICD-10-CM | POA: Diagnosis not present

## 2022-01-17 DIAGNOSIS — E7800 Pure hypercholesterolemia, unspecified: Secondary | ICD-10-CM | POA: Diagnosis not present

## 2022-01-17 DIAGNOSIS — N390 Urinary tract infection, site not specified: Secondary | ICD-10-CM | POA: Diagnosis not present

## 2022-01-17 DIAGNOSIS — G9341 Metabolic encephalopathy: Secondary | ICD-10-CM | POA: Diagnosis not present

## 2022-01-17 DIAGNOSIS — I6389 Other cerebral infarction: Secondary | ICD-10-CM | POA: Diagnosis not present

## 2022-01-17 DIAGNOSIS — J189 Pneumonia, unspecified organism: Secondary | ICD-10-CM | POA: Diagnosis not present

## 2022-01-17 DIAGNOSIS — J9602 Acute respiratory failure with hypercapnia: Secondary | ICD-10-CM | POA: Diagnosis not present

## 2022-01-17 DIAGNOSIS — Z7189 Other specified counseling: Secondary | ICD-10-CM | POA: Diagnosis not present

## 2022-01-17 DIAGNOSIS — I6381 Other cerebral infarction due to occlusion or stenosis of small artery: Secondary | ICD-10-CM | POA: Diagnosis not present

## 2022-01-17 DIAGNOSIS — N184 Chronic kidney disease, stage 4 (severe): Secondary | ICD-10-CM | POA: Diagnosis not present

## 2022-01-17 DIAGNOSIS — R531 Weakness: Secondary | ICD-10-CM | POA: Diagnosis not present

## 2022-01-17 DIAGNOSIS — J9621 Acute and chronic respiratory failure with hypoxia: Secondary | ICD-10-CM | POA: Diagnosis not present

## 2022-01-17 DIAGNOSIS — R059 Cough, unspecified: Secondary | ICD-10-CM | POA: Diagnosis not present

## 2022-01-17 DIAGNOSIS — R410 Disorientation, unspecified: Secondary | ICD-10-CM | POA: Diagnosis not present

## 2022-01-17 DIAGNOSIS — K219 Gastro-esophageal reflux disease without esophagitis: Secondary | ICD-10-CM | POA: Diagnosis not present

## 2022-01-17 DIAGNOSIS — Z6841 Body Mass Index (BMI) 40.0 and over, adult: Secondary | ICD-10-CM | POA: Diagnosis not present

## 2022-01-17 DIAGNOSIS — R1312 Dysphagia, oropharyngeal phase: Secondary | ICD-10-CM | POA: Diagnosis not present

## 2022-01-17 DIAGNOSIS — Z87442 Personal history of urinary calculi: Secondary | ICD-10-CM | POA: Diagnosis not present

## 2022-01-17 DIAGNOSIS — M1380 Other specified arthritis, unspecified site: Secondary | ICD-10-CM | POA: Diagnosis not present

## 2022-01-17 DIAGNOSIS — A419 Sepsis, unspecified organism: Secondary | ICD-10-CM | POA: Diagnosis not present

## 2022-01-17 DIAGNOSIS — Z9181 History of falling: Secondary | ICD-10-CM | POA: Diagnosis not present

## 2022-01-17 DIAGNOSIS — J4520 Mild intermittent asthma, uncomplicated: Secondary | ICD-10-CM | POA: Diagnosis not present

## 2022-01-17 DIAGNOSIS — E785 Hyperlipidemia, unspecified: Secondary | ICD-10-CM | POA: Diagnosis not present

## 2022-01-17 DIAGNOSIS — E876 Hypokalemia: Secondary | ICD-10-CM | POA: Diagnosis not present

## 2022-01-17 DIAGNOSIS — M6259 Muscle wasting and atrophy, not elsewhere classified, multiple sites: Secondary | ICD-10-CM | POA: Diagnosis not present

## 2022-01-17 DIAGNOSIS — S8012XD Contusion of left lower leg, subsequent encounter: Secondary | ICD-10-CM | POA: Diagnosis not present

## 2022-01-18 DIAGNOSIS — N179 Acute kidney failure, unspecified: Secondary | ICD-10-CM | POA: Diagnosis not present

## 2022-01-18 DIAGNOSIS — S8012XD Contusion of left lower leg, subsequent encounter: Secondary | ICD-10-CM | POA: Diagnosis not present

## 2022-01-18 DIAGNOSIS — J9621 Acute and chronic respiratory failure with hypoxia: Secondary | ICD-10-CM | POA: Diagnosis not present

## 2022-01-18 DIAGNOSIS — N390 Urinary tract infection, site not specified: Secondary | ICD-10-CM | POA: Diagnosis not present

## 2022-01-19 DIAGNOSIS — J9600 Acute respiratory failure, unspecified whether with hypoxia or hypercapnia: Secondary | ICD-10-CM | POA: Diagnosis not present

## 2022-01-19 DIAGNOSIS — I10 Essential (primary) hypertension: Secondary | ICD-10-CM | POA: Diagnosis not present

## 2022-01-19 DIAGNOSIS — N3090 Cystitis, unspecified without hematuria: Secondary | ICD-10-CM | POA: Diagnosis not present

## 2022-01-19 DIAGNOSIS — S8012XS Contusion of left lower leg, sequela: Secondary | ICD-10-CM | POA: Diagnosis not present

## 2022-01-20 ENCOUNTER — Emergency Department (HOSPITAL_COMMUNITY): Payer: Medicare Other

## 2022-01-20 ENCOUNTER — Inpatient Hospital Stay (HOSPITAL_COMMUNITY)
Admission: EM | Admit: 2022-01-20 | Discharge: 2022-02-13 | DRG: 871 | Disposition: E | Payer: Medicare Other | Source: Skilled Nursing Facility | Attending: Internal Medicine | Admitting: Internal Medicine

## 2022-01-20 ENCOUNTER — Encounter (HOSPITAL_COMMUNITY): Payer: Self-pay | Admitting: Emergency Medicine

## 2022-01-20 ENCOUNTER — Other Ambulatory Visit: Payer: Self-pay

## 2022-01-20 DIAGNOSIS — Z66 Do not resuscitate: Secondary | ICD-10-CM | POA: Diagnosis not present

## 2022-01-20 DIAGNOSIS — R6520 Severe sepsis without septic shock: Secondary | ICD-10-CM | POA: Diagnosis not present

## 2022-01-20 DIAGNOSIS — E1142 Type 2 diabetes mellitus with diabetic polyneuropathy: Secondary | ICD-10-CM | POA: Diagnosis present

## 2022-01-20 DIAGNOSIS — R29810 Facial weakness: Secondary | ICD-10-CM | POA: Diagnosis not present

## 2022-01-20 DIAGNOSIS — Z6841 Body Mass Index (BMI) 40.0 and over, adult: Secondary | ICD-10-CM | POA: Diagnosis not present

## 2022-01-20 DIAGNOSIS — J189 Pneumonia, unspecified organism: Secondary | ICD-10-CM | POA: Diagnosis not present

## 2022-01-20 DIAGNOSIS — I129 Hypertensive chronic kidney disease with stage 1 through stage 4 chronic kidney disease, or unspecified chronic kidney disease: Secondary | ICD-10-CM | POA: Diagnosis present

## 2022-01-20 DIAGNOSIS — R410 Disorientation, unspecified: Secondary | ICD-10-CM | POA: Diagnosis not present

## 2022-01-20 DIAGNOSIS — Z7901 Long term (current) use of anticoagulants: Secondary | ICD-10-CM

## 2022-01-20 DIAGNOSIS — E7800 Pure hypercholesterolemia, unspecified: Secondary | ICD-10-CM | POA: Diagnosis present

## 2022-01-20 DIAGNOSIS — I6389 Other cerebral infarction: Secondary | ICD-10-CM | POA: Diagnosis not present

## 2022-01-20 DIAGNOSIS — Z515 Encounter for palliative care: Secondary | ICD-10-CM

## 2022-01-20 DIAGNOSIS — A419 Sepsis, unspecified organism: Secondary | ICD-10-CM | POA: Diagnosis not present

## 2022-01-20 DIAGNOSIS — Z79899 Other long term (current) drug therapy: Secondary | ICD-10-CM | POA: Diagnosis not present

## 2022-01-20 DIAGNOSIS — Z794 Long term (current) use of insulin: Secondary | ICD-10-CM | POA: Diagnosis not present

## 2022-01-20 DIAGNOSIS — J690 Pneumonitis due to inhalation of food and vomit: Secondary | ICD-10-CM | POA: Diagnosis not present

## 2022-01-20 DIAGNOSIS — M109 Gout, unspecified: Secondary | ICD-10-CM | POA: Diagnosis not present

## 2022-01-20 DIAGNOSIS — M47816 Spondylosis without myelopathy or radiculopathy, lumbar region: Secondary | ICD-10-CM | POA: Diagnosis not present

## 2022-01-20 DIAGNOSIS — L89150 Pressure ulcer of sacral region, unstageable: Secondary | ICD-10-CM | POA: Diagnosis present

## 2022-01-20 DIAGNOSIS — J9601 Acute respiratory failure with hypoxia: Secondary | ICD-10-CM | POA: Diagnosis present

## 2022-01-20 DIAGNOSIS — N184 Chronic kidney disease, stage 4 (severe): Secondary | ICD-10-CM | POA: Diagnosis not present

## 2022-01-20 DIAGNOSIS — N1831 Chronic kidney disease, stage 3a: Secondary | ICD-10-CM | POA: Diagnosis not present

## 2022-01-20 DIAGNOSIS — Z809 Family history of malignant neoplasm, unspecified: Secondary | ICD-10-CM

## 2022-01-20 DIAGNOSIS — G9341 Metabolic encephalopathy: Secondary | ICD-10-CM | POA: Diagnosis present

## 2022-01-20 DIAGNOSIS — D7589 Other specified diseases of blood and blood-forming organs: Secondary | ICD-10-CM | POA: Diagnosis present

## 2022-01-20 DIAGNOSIS — J9621 Acute and chronic respiratory failure with hypoxia: Secondary | ICD-10-CM | POA: Diagnosis not present

## 2022-01-20 DIAGNOSIS — J9602 Acute respiratory failure with hypercapnia: Secondary | ICD-10-CM | POA: Diagnosis present

## 2022-01-20 DIAGNOSIS — I10 Essential (primary) hypertension: Secondary | ICD-10-CM | POA: Diagnosis not present

## 2022-01-20 DIAGNOSIS — E119 Type 2 diabetes mellitus without complications: Secondary | ICD-10-CM

## 2022-01-20 DIAGNOSIS — Z8616 Personal history of COVID-19: Secondary | ICD-10-CM | POA: Diagnosis not present

## 2022-01-20 DIAGNOSIS — R29898 Other symptoms and signs involving the musculoskeletal system: Secondary | ICD-10-CM | POA: Diagnosis not present

## 2022-01-20 DIAGNOSIS — K219 Gastro-esophageal reflux disease without esophagitis: Secondary | ICD-10-CM | POA: Diagnosis present

## 2022-01-20 DIAGNOSIS — Z9981 Dependence on supplemental oxygen: Secondary | ICD-10-CM

## 2022-01-20 DIAGNOSIS — Z7189 Other specified counseling: Secondary | ICD-10-CM | POA: Diagnosis not present

## 2022-01-20 DIAGNOSIS — E1122 Type 2 diabetes mellitus with diabetic chronic kidney disease: Secondary | ICD-10-CM | POA: Diagnosis present

## 2022-01-20 DIAGNOSIS — N179 Acute kidney failure, unspecified: Secondary | ICD-10-CM | POA: Diagnosis present

## 2022-01-20 DIAGNOSIS — R6521 Severe sepsis with septic shock: Secondary | ICD-10-CM | POA: Diagnosis not present

## 2022-01-20 DIAGNOSIS — N281 Cyst of kidney, acquired: Secondary | ICD-10-CM | POA: Diagnosis not present

## 2022-01-20 DIAGNOSIS — J4520 Mild intermittent asthma, uncomplicated: Secondary | ICD-10-CM | POA: Diagnosis not present

## 2022-01-20 DIAGNOSIS — Z20822 Contact with and (suspected) exposure to covid-19: Secondary | ICD-10-CM | POA: Diagnosis present

## 2022-01-20 DIAGNOSIS — I6381 Other cerebral infarction due to occlusion or stenosis of small artery: Secondary | ICD-10-CM | POA: Diagnosis not present

## 2022-01-20 DIAGNOSIS — R0902 Hypoxemia: Secondary | ICD-10-CM | POA: Diagnosis not present

## 2022-01-20 DIAGNOSIS — I639 Cerebral infarction, unspecified: Secondary | ICD-10-CM | POA: Diagnosis not present

## 2022-01-20 DIAGNOSIS — R059 Cough, unspecified: Secondary | ICD-10-CM | POA: Diagnosis not present

## 2022-01-20 DIAGNOSIS — N3090 Cystitis, unspecified without hematuria: Secondary | ICD-10-CM | POA: Diagnosis not present

## 2022-01-20 DIAGNOSIS — R5383 Other fatigue: Secondary | ICD-10-CM | POA: Diagnosis not present

## 2022-01-20 DIAGNOSIS — Z86711 Personal history of pulmonary embolism: Secondary | ICD-10-CM

## 2022-01-20 DIAGNOSIS — Z8249 Family history of ischemic heart disease and other diseases of the circulatory system: Secondary | ICD-10-CM

## 2022-01-20 DIAGNOSIS — R918 Other nonspecific abnormal finding of lung field: Secondary | ICD-10-CM | POA: Diagnosis not present

## 2022-01-20 DIAGNOSIS — Z01818 Encounter for other preprocedural examination: Secondary | ICD-10-CM | POA: Diagnosis not present

## 2022-01-20 DIAGNOSIS — R531 Weakness: Secondary | ICD-10-CM | POA: Diagnosis not present

## 2022-01-20 DIAGNOSIS — J9811 Atelectasis: Secondary | ICD-10-CM | POA: Diagnosis not present

## 2022-01-20 DIAGNOSIS — Z743 Need for continuous supervision: Secondary | ICD-10-CM | POA: Diagnosis not present

## 2022-01-20 LAB — CBC WITH DIFFERENTIAL/PLATELET
Abs Immature Granulocytes: 0.12 10*3/uL — ABNORMAL HIGH (ref 0.00–0.07)
Basophils Absolute: 0 10*3/uL (ref 0.0–0.1)
Basophils Relative: 0 %
Eosinophils Absolute: 0.1 10*3/uL (ref 0.0–0.5)
Eosinophils Relative: 0 %
HCT: 34 % — ABNORMAL LOW (ref 36.0–46.0)
Hemoglobin: 10.7 g/dL — ABNORMAL LOW (ref 12.0–15.0)
Immature Granulocytes: 1 %
Lymphocytes Relative: 10 %
Lymphs Abs: 1.5 10*3/uL (ref 0.7–4.0)
MCH: 33.4 pg (ref 26.0–34.0)
MCHC: 31.5 g/dL (ref 30.0–36.0)
MCV: 106.3 fL — ABNORMAL HIGH (ref 80.0–100.0)
Monocytes Absolute: 0.9 10*3/uL (ref 0.1–1.0)
Monocytes Relative: 6 %
Neutro Abs: 12.4 10*3/uL — ABNORMAL HIGH (ref 1.7–7.7)
Neutrophils Relative %: 83 %
Platelets: ADEQUATE 10*3/uL (ref 150–400)
RBC: 3.2 MIL/uL — ABNORMAL LOW (ref 3.87–5.11)
RDW: 21.2 % — ABNORMAL HIGH (ref 11.5–15.5)
Smear Review: ADEQUATE
WBC: 15 10*3/uL — ABNORMAL HIGH (ref 4.0–10.5)
nRBC: 0 % (ref 0.0–0.2)

## 2022-01-20 LAB — RESP PANEL BY RT-PCR (RSV, FLU A&B, COVID)  RVPGX2
Influenza A by PCR: NEGATIVE
Influenza B by PCR: NEGATIVE
Resp Syncytial Virus by PCR: NEGATIVE
SARS Coronavirus 2 by RT PCR: NEGATIVE

## 2022-01-20 LAB — I-STAT CHEM 8, ED
BUN: 39 mg/dL — ABNORMAL HIGH (ref 8–23)
Calcium, Ion: 1.19 mmol/L (ref 1.15–1.40)
Chloride: 100 mmol/L (ref 98–111)
Creatinine, Ser: 2.6 mg/dL — ABNORMAL HIGH (ref 0.44–1.00)
Glucose, Bld: 190 mg/dL — ABNORMAL HIGH (ref 70–99)
HCT: 30 % — ABNORMAL LOW (ref 36.0–46.0)
Hemoglobin: 10.2 g/dL — ABNORMAL LOW (ref 12.0–15.0)
Potassium: 4.1 mmol/L (ref 3.5–5.1)
Sodium: 134 mmol/L — ABNORMAL LOW (ref 135–145)
TCO2: 28 mmol/L (ref 22–32)

## 2022-01-20 LAB — I-STAT VENOUS BLOOD GAS, ED
Acid-Base Excess: 2 mmol/L (ref 0.0–2.0)
Bicarbonate: 26.8 mmol/L (ref 20.0–28.0)
Calcium, Ion: 1.19 mmol/L (ref 1.15–1.40)
HCT: 31 % — ABNORMAL LOW (ref 36.0–46.0)
Hemoglobin: 10.5 g/dL — ABNORMAL LOW (ref 12.0–15.0)
O2 Saturation: 60 %
Potassium: 4.1 mmol/L (ref 3.5–5.1)
Sodium: 134 mmol/L — ABNORMAL LOW (ref 135–145)
TCO2: 28 mmol/L (ref 22–32)
pCO2, Ven: 44 mmHg (ref 44–60)
pH, Ven: 7.392 (ref 7.25–7.43)
pO2, Ven: 32 mmHg (ref 32–45)

## 2022-01-20 LAB — CBG MONITORING, ED: Glucose-Capillary: 187 mg/dL — ABNORMAL HIGH (ref 70–99)

## 2022-01-20 MED ORDER — SODIUM CHLORIDE 0.9 % IV SOLN
500.0000 mg | Freq: Once | INTRAVENOUS | Status: AC
  Administered 2022-01-21: 500 mg via INTRAVENOUS
  Filled 2022-01-20: qty 5

## 2022-01-20 MED ORDER — SODIUM CHLORIDE 0.9 % IV BOLUS
1000.0000 mL | Freq: Once | INTRAVENOUS | Status: AC
  Administered 2022-01-21: 1000 mL via INTRAVENOUS

## 2022-01-20 MED ORDER — SODIUM CHLORIDE 0.9 % IV SOLN
1.0000 g | Freq: Once | INTRAVENOUS | Status: AC
  Administered 2022-01-21: 1 g via INTRAVENOUS
  Filled 2022-01-20: qty 10

## 2022-01-20 NOTE — ED Provider Notes (Signed)
[place] EMERGENCY DEPARTMENT Provider Note   CSN: [id number] Arrival date & time: 02/01/2022  2106     History {Add pertinent medical, surgical, social history, OB history to HPI:1} Chief Complaint  Patient presents with   Altered Mental Status    Lauren Webb is a 76 y.o. female.   Altered Mental Status    Patient has history of diabetes, reflux, high cholesterol, high blood pressure, asthma, obesity who presents to the ED for evaluation of altered mental status.  Patient is currently at [facility] and rehab nursing facility.  According to EMS report the patient has been confused for the last few days but worse today.  There has been reported sick contacts at the nursing facility.  Patient denies any complaints today.  She states she is not sure why she is here.  She does know she is at a medical facility.  She is not able to provide any other information but does answer questions appropriately  Home Medications Prior to Admission medications   Medication Sig Start Date End Date Taking? Authorizing Provider  albuterol (VENTOLIN HFA) 108 (90 Base) MCG/ACT inhaler 2 puffs every 4-6 hours as needed 05/10/20   Kozlow, Donnamarie Poag, MD  allopurinol (ZYLOPRIM) 300 MG tablet Take 300 mg by mouth daily. 03/15/21   [provider]  ALPRAZolam Duanne Moron) 0.25 MG tablet Take 0.25 mg by mouth 2 (two) times daily as needed. 01/13/20   [provider]  amoxicillin (AMOXIL) 500 MG capsule Take 1,000 mg by mouth 2 (two) times daily. 07/12/21   [provider]  aspirin EC 81 MG tablet Take 81 mg by mouth daily.    [provider]  B-D UF III MINI PEN NEEDLES 31G X 5 MM MISC  10/23/19   [provider]  bacitracin 500 UNIT/GM ointment Apply 1 application. topically 2 (two) times daily. 12/21/20   [provider]  Cholecalciferol 25 MCG (1000 UT) tablet Take by mouth.    [provider]  Cholecalciferol 50 MCG (2000 UT) CAPS      [provider]  Cyanocobalamin (VITAMIN B-12 PO) Take 1 tablet by mouth at bedtime.     [provider]  famotidine (PEPCID) 40 MG tablet Take 1 tablet (40 mg total) by mouth daily. 05/10/20   Kozlow, Donnamarie Poag, MD  fluorometholone (FML) 0.1 % ophthalmic suspension  11/17/20   [provider]  fluticasone (FLONASE) 50 MCG/ACT nasal spray 1 spray in each nostril 2 times per day 05/10/20   Jiles Prows, MD  fluticasone (FLOVENT HFA) 110 MCG/ACT inhaler Inhale 2 puffs into the lungs 2 (two) times daily. 05/10/20   Kozlow, Donnamarie Poag, MD  gabapentin (NEURONTIN) 300 MG capsule Take by mouth. 12/08/20   [provider]  glipiZIDE (GLUCOTROL) 10 MG tablet Take 10 mg by mouth 2 (two) times daily as needed (high blood sugar).     [provider]  HUMULIN 70/30 KWIKPEN (70-30) 100 UNIT/ML PEN Inject 12-20 Units into the skin 2 (two) times daily with a meal.  02/17/19   [provider]  HYDROcodone-acetaminophen (NORCO/VICODIN) 5-325 MG tablet Take 1 tablet by mouth daily as needed for moderate pain.    [provider]  levocetirizine (XYZAL) 5 MG tablet Take 5 mg by mouth daily. 03/24/21   [provider]  loratadine (CLARITIN) 10 MG tablet Take 1 tablet (10 mg total) by mouth daily. 05/10/20   Kozlow, Donnamarie Poag, MD  meloxicam (MOBIC) 7.5 MG  tablet Take 7.5 mg by mouth 2 (two) times daily. 02/15/21   [provider]  montelukast (SINGULAIR) 10 MG tablet TAKE 1 TABLET BY MOUTH EVERYDAY AT BEDTIME 07/16/20   Kozlow, Donnamarie Poag, MD  omeprazole (PRILOSEC) 40 MG capsule Take 1 capsule by mouth daily. 10/06/17   [provider]  ONE TOUCH ULTRA TEST test strip USE AS DIRECTED 4 TIMES A DAY 01/10/17   [provider]  rosuvastatin (CRESTOR) 40 MG tablet Take 20 mg by mouth at bedtime.     [provider]  silver sulfADIAZINE (SILVADENE) 1 % cream SMARTSIG:1 Topical Every Night 10/28/19   [provider]  tiZANidine (ZANAFLEX)  2 MG tablet Take 2 mg by mouth 2 (two) times daily as needed. 09/02/19   [provider]  tiZANidine (ZANAFLEX) 4 MG tablet Take 4 mg by mouth 2 (two) times daily. 05/09/21   [provider]  torsemide (DEMADEX) 10 MG tablet Take 10 mg by mouth daily. 12/10/18   [provider]  valACYclovir (VALTREX) 500 MG tablet Take 500 mg by mouth 2 (two) times daily. 11/23/20   [provider]      Allergies    Avelox [moxifloxacin hcl in nacl], Moxifloxacin, Nsaids, and Other    Review of Systems   Review of Systems  Physical Exam Updated Vital Signs BP (!) 100/53 (BP Location: Right Arm)   Pulse 98   Temp 98.2 F (36.8 C) (Oral)   Resp 20   Ht 1.626 m ('5\' 4"'$ )   Wt 130 kg   SpO2 94%   BMI 49.19 kg/m  Physical Exam Vitals and nursing note reviewed.  Constitutional:      Appearance: She is well-developed. She is obese.  HENT:     Head: Normocephalic and atraumatic.     Right Ear: External ear normal.     Left Ear: External ear normal.  Eyes:     General: No scleral icterus.       Right eye: No discharge.        Left eye: No discharge.     Conjunctiva/sclera: Conjunctivae normal.  Neck:     Trachea: No tracheal deviation.  Cardiovascular:     Rate and Rhythm: Normal rate and regular rhythm.  Pulmonary:     Effort: Pulmonary effort is normal. No respiratory distress.     Breath sounds: Normal breath sounds. No stridor. No wheezing or rales.  Abdominal:     General: Bowel sounds are normal. There is no distension.     Palpations: Abdomen is soft.     Tenderness: There is no abdominal tenderness. There is no guarding or rebound.  Musculoskeletal:        General: No tenderness or deformity.     Cervical back: Neck supple.     Right lower leg: Edema present.     Left lower leg: Edema present.     Comments: Edema bilateral lower extremities, no erythema  Skin:    General: Skin is warm and dry.     Findings: No rash.  Neurological:     General:  No focal deficit present.     Mental Status: She is alert.     Cranial Nerves: No cranial nerve deficit, dysarthria or facial asymmetry.     Sensory: No sensory deficit.     Motor: Weakness present. No abnormal muscle tone or seizure activity.     Comments: Generalized weakness but patient will grip my hands equally, she does plantarflex with both  legs  Psychiatric:        Mood and Affect: Mood normal.     ED Results / Procedures / Treatments   Labs (all labs ordered are listed, but only abnormal results are displayed) Labs Reviewed  COMPREHENSIVE METABOLIC PANEL  CBC WITH DIFFERENTIAL/PLATELET  URINALYSIS, ROUTINE W REFLEX MICROSCOPIC  TSH  AMMONIA  CBG MONITORING, ED  I-STAT VENOUS BLOOD GAS, ED  I-STAT CHEM 8, ED    EKG None  Radiology No results found.  Procedures Procedures  {Document cardiac monitor, telemetry assessment procedure when appropriate:1}  Medications Ordered in ED Medications - No data to display  ED Course/ Medical Decision Making/ A&P                           Medical Decision Making Amount and/or Complexity of Data Reviewed Labs: ordered. Radiology: ordered.   ***  {Document critical care time when appropriate:1} {Document review of labs and clinical decision tools ie heart score, Chads2Vasc2 etc:1}  {Document your independent review of radiology images, and any outside records:1} {Document your discussion with family members, caretakers, and with consultants:1} {Document social determinants of health affecting pt's care:1} {Document your decision making why or why not admission, treatments were needed:1} Final Clinical Impression(s) / ED Diagnoses Final diagnoses:  None    Rx / DC Orders ED Discharge Orders     None

## 2022-01-20 NOTE — ED Notes (Signed)
Daughter Josph Macho [phone] would like an update asap

## 2022-01-20 NOTE — ED Triage Notes (Signed)
Patient BIB [facility] from [facility] c/o confusion x few days, but worse today.  EMS reports patient has been seen recently multiple times for same at [facility] but the family isn't happy with the answers, so they wanted her transported to [facility].  EMS reports a covid outbreak at facility. Patient is on 2L Yoe baseline.  92/p 93 HR 93% CBG 183 T 97.3

## 2022-01-20 NOTE — ED Provider Notes (Incomplete)
[place] EMERGENCY DEPARTMENT Provider Note   CSN: [id number] Arrival date & time: 01/24/2022  2106     History {Add pertinent medical, surgical, social history, OB history to HPI:1} Chief Complaint  Patient presents with  . Altered Mental Status    Lauren Webb is a 76 y.o. female.   Altered Mental Status    Patient has history of diabetes, reflux, high cholesterol, high blood pressure, asthma, obesity who presents to the ED for evaluation of altered mental status.  Patient is currently at [facility] and rehab nursing facility.  According to EMS report the patient has been confused for the last few days but worse today.  There has been reported sick contacts at the nursing facility.  Patient denies any complaints today.  She states she is not sure why she is here.  She does know she is at a medical facility.  She is not able to provide any other information but does answer questions appropriately  Home Medications Prior to Admission medications   Medication Sig Start Date End Date Taking? Authorizing Provider  albuterol (VENTOLIN HFA) 108 (90 Base) MCG/ACT inhaler 2 puffs every 4-6 hours as needed 05/10/20   Kozlow, Donnamarie Poag, MD  allopurinol (ZYLOPRIM) 300 MG tablet Take 300 mg by mouth daily. 03/15/21   [provider]  ALPRAZolam Duanne Moron) 0.25 MG tablet Take 0.25 mg by mouth 2 (two) times daily as needed. 01/13/20   [provider]  amoxicillin (AMOXIL) 500 MG capsule Take 1,000 mg by mouth 2 (two) times daily. 07/12/21   [provider]  aspirin EC 81 MG tablet Take 81 mg by mouth daily.    [provider]  B-D UF III MINI PEN NEEDLES 31G X 5 MM MISC  10/23/19   [provider]  bacitracin 500 UNIT/GM ointment Apply 1 application. topically 2 (two) times daily. 12/21/20   [provider]  Cholecalciferol 25 MCG (1000 UT) tablet Take by mouth.    [provider]  Cholecalciferol 50 MCG (2000 UT) CAPS      [provider]  Cyanocobalamin (VITAMIN B-12 PO) Take 1 tablet by mouth at bedtime.     [provider]  famotidine (PEPCID) 40 MG tablet Take 1 tablet (40 mg total) by mouth daily. 05/10/20   Kozlow, Donnamarie Poag, MD  fluorometholone (FML) 0.1 % ophthalmic suspension  11/17/20   [provider]  fluticasone (FLONASE) 50 MCG/ACT nasal spray 1 spray in each nostril 2 times per day 05/10/20   Jiles Prows, MD  fluticasone (FLOVENT HFA) 110 MCG/ACT inhaler Inhale 2 puffs into the lungs 2 (two) times daily. 05/10/20   Kozlow, Donnamarie Poag, MD  gabapentin (NEURONTIN) 300 MG capsule Take by mouth. 12/08/20   [provider]  glipiZIDE (GLUCOTROL) 10 MG tablet Take 10 mg by mouth 2 (two) times daily as needed (high blood sugar).     [provider]  HUMULIN 70/30 KWIKPEN (70-30) 100 UNIT/ML PEN Inject 12-20 Units into the skin 2 (two) times daily with a meal.  02/17/19   [provider]  HYDROcodone-acetaminophen (NORCO/VICODIN) 5-325 MG tablet Take 1 tablet by mouth daily as needed for moderate pain.    [provider]  levocetirizine (XYZAL) 5 MG tablet Take 5 mg by mouth daily. 03/24/21   [provider]  loratadine (CLARITIN) 10 MG tablet Take 1 tablet (10 mg total) by mouth daily. 05/10/20   Kozlow, Donnamarie Poag, MD  meloxicam (MOBIC) 7.5 MG  tablet Take 7.5 mg by mouth 2 (two) times daily. 02/15/21   [provider]  montelukast (SINGULAIR) 10 MG tablet TAKE 1 TABLET BY MOUTH EVERYDAY AT BEDTIME 07/16/20   Kozlow, Donnamarie Poag, MD  omeprazole (PRILOSEC) 40 MG capsule Take 1 capsule by mouth daily. 10/06/17   [provider]  ONE TOUCH ULTRA TEST test strip USE AS DIRECTED 4 TIMES A DAY 01/10/17   [provider]  rosuvastatin (CRESTOR) 40 MG tablet Take 20 mg by mouth at bedtime.     [provider]  silver sulfADIAZINE (SILVADENE) 1 % cream SMARTSIG:1 Topical Every Night 10/28/19   [provider]  tiZANidine  (ZANAFLEX) 2 MG tablet Take 2 mg by mouth 2 (two) times daily as needed. 09/02/19   [provider]  tiZANidine (ZANAFLEX) 4 MG tablet Take 4 mg by mouth 2 (two) times daily. 05/09/21   [provider]  torsemide (DEMADEX) 10 MG tablet Take 10 mg by mouth daily. 12/10/18   [provider]  valACYclovir (VALTREX) 500 MG tablet Take 500 mg by mouth 2 (two) times daily. 11/23/20   [provider]      Allergies    Avelox [moxifloxacin hcl in nacl], Moxifloxacin, Nsaids, and Other    Review of Systems   Review of Systems  Physical Exam Updated Vital Signs BP (!) 89/42   Pulse (!) 102   Temp 98.2 F (36.8 C) (Oral)   Resp (!) 21   Ht 1.626 m ('5\' 4"'$ )   Wt 130 kg   SpO2 100%   BMI 49.19 kg/m  Physical Exam Vitals and nursing note reviewed.  Constitutional:      Appearance: She is well-developed. She is obese.  HENT:     Head: Normocephalic and atraumatic.     Right Ear: External ear normal.     Left Ear: External ear normal.  Eyes:     General: No scleral icterus.       Right eye: No discharge.        Left eye: No discharge.     Conjunctiva/sclera: Conjunctivae normal.  Neck:     Trachea: No tracheal deviation.  Cardiovascular:     Rate and Rhythm: Normal rate and regular rhythm.  Pulmonary:     Effort: Pulmonary effort is normal. No respiratory distress.     Breath sounds: Normal breath sounds. No stridor. No wheezing or rales.  Abdominal:     General: Bowel sounds are normal. There is no distension.     Palpations: Abdomen is soft.     Tenderness: There is no abdominal tenderness. There is no guarding or rebound.  Musculoskeletal:        General: No tenderness or deformity.     Cervical back: Neck supple.     Right lower leg: Edema present.     Left lower leg: Edema present.     Comments: Edema bilateral lower extremities, no erythema  Skin:    General: Skin is warm and dry.     Findings: No rash.  Neurological:     General: No  focal deficit present.     Mental Status: She is alert.     Cranial Nerves: No cranial nerve deficit, dysarthria or facial asymmetry.     Sensory: No sensory deficit.     Motor: Weakness present. No abnormal muscle tone or seizure activity.     Comments: Generalized weakness but patient will grip my hands equally, she does plantarflex with both legs  Psychiatric:        Mood and Affect: Mood normal.     ED Results / Procedures / Treatments   Labs (all labs ordered are listed, but only abnormal results are displayed) Labs Reviewed  CBC WITH DIFFERENTIAL/PLATELET - Abnormal; Notable for the following components:      Result Value   WBC 15.0 (*)    RBC 3.20 (*)    Hemoglobin 10.7 (*)    HCT 34.0 (*)    MCV 106.3 (*)    RDW 21.2 (*)    Neutro Abs 12.4 (*)    Abs Immature Granulocytes 0.12 (*)    All other components within normal limits  CBG MONITORING, ED - Abnormal; Notable for the following components:   Glucose-Capillary 187 (*)    All other components within normal limits  I-STAT VENOUS BLOOD GAS, ED - Abnormal; Notable for the following components:   Sodium 134 (*)    HCT 31.0 (*)    Hemoglobin 10.5 (*)    All other components within normal limits  I-STAT CHEM 8, ED - Abnormal; Notable for the following components:   Sodium 134 (*)    BUN 39 (*)    Creatinine, Ser 2.60 (*)    Glucose, Bld 190 (*)    Hemoglobin 10.2 (*)    HCT 30.0 (*)    All other components within normal limits  RESP PANEL BY RT-PCR (RSV, FLU A&B, COVID)  [id number]  COMPREHENSIVE METABOLIC PANEL  URINALYSIS, ROUTINE W REFLEX MICROSCOPIC  TSH  AMMONIA  TSH    EKG EKG Interpretation  Date/Time:  Friday January 20 2022 21:36:38 EST Ventricular Rate:  99 PR Interval:  216 QRS Duration: 82 QT Interval:  338 QTC Calculation: 433 R Axis:   155 Text Interpretation: Sinus rhythm with 1st degree A-V block Low voltage QRS Left posterior fascicular block Cannot rule out Anterior infarct , age  undetermined Abnormal ECG No previous ECGs available Artifact Confirmed by Dorie Rank [phone]) on 02/02/2022 9:58:29 PM  Radiology CT HEAD WO CONTRAST  Result Date: 01/26/2022 CLINICAL DATA:  Altered mental status EXAM: CT HEAD WITHOUT CONTRAST TECHNIQUE: Contiguous axial images were obtained from the base of the skull through the vertex without intravenous contrast. RADIATION DOSE REDUCTION: This exam was performed according to the departmental dose-optimization program which includes automated exposure control, adjustment of the mA and/or kV according to patient size and/or use of iterative reconstruction technique. COMPARISON:  01/08/2022 FINDINGS: Brain: Normal anatomic configuration. Parenchymal volume loss is commensurate with the patient's age. Mild periventricular white matter changes are present likely reflecting the sequela of small vessel ischemia. 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 No abnormal intra or extra-axial mass lesion or fluid collection. No abnormal mass effect or midline shift. No evidence of acute intracranial hemorrhage or infarct. Ventricular size is normal. Cerebellum unremarkable. Vascular: No asymmetric hyperdense vasculature at the skull base. Skull: Intact Sinuses/Orbits: There is dense opacification of the left frontal sinus and several left ethmoid air cells, similar to prior examination. Small polyp within the left maxillary sinus again noted. Remaining paranasal sinuses are clear. Stable bilateral exophthalmos. Orbits are otherwise unremarkable. Other: There has developed fluid opacification of the left mastoid air cells. No superimposed osseous erosion. Middle ear cavities and right mastoid air cells are clear. IMPRESSION: 1. No acute intracranial abnormality. 2. Stable senescent changes and remote lacunar infarcts. 3. Interval development of left mastoid effusion. 4. Stable left  frontal and ethmoid sinus disease. 5. Stable bilateral exophthalmos. Electronically Signed   By: Fidela Salisbury M.D.   On: 02/11/2022 23:16   DG Chest 1 View  Result Date: 01/29/2022 CLINICAL DATA:  Altered mental status EXAM: CHEST  1 VIEW COMPARISON:  Chest radiograph and CT of 01/07/2022 FINDINGS: Stable mild elevation of the left hemidiaphragm. Asymmetric ground-glass opacity throughout the right lung has developed, possibly infectious or inflammatory in nature. Mild infiltrate is also noted within the retrocardiac region. No pneumothorax or pleural effusion. Bulbous appearance of the aortic knob is stable and related to the left pulmonary artery better seen on prior CT examination. Cardiac size within normal limits. No acute bone abnormality. IMPRESSION: Interval development of asymmetric ground-glass opacity throughout the right lung, possibly infectious or inflammatory in the acute setting. Stable pulmonary hypoinflation. Electronically Signed   By: Fidela Salisbury M.D.   On: 02/06/2022 23:08    Procedures Procedures  {Document cardiac monitor, telemetry assessment procedure when appropriate:1}  Medications Ordered in ED Medications  cefTRIAXone (ROCEPHIN) 1 g in sodium chloride 0.9 % 100 mL IVPB (has no administration in time range)  azithromycin (ZITHROMAX) 500 mg in sodium chloride 0.9 % 250 mL IVPB (has no administration in time range)    ED Course/ Medical Decision Making/ A&P Clinical Course as of 01/19/2022 2349  Fri Jan 20, 2022  2334 CBC with Differential/Platelet(!) Leukocytosis noted, hemoglobin decreased compared to previous values [JK]  2335 I-Stat venous blood gas, ([facility], [facility], [facility])(!) No signs of hypercarbia or acidosis [JK]  2335 I-stat chem 8, ED (not at [facility], [facility] or [facility])(!) Creatinine elevated consistent with an AKI [JK]  2335 Resp panel by RT-PCR (RSV, Flu A&B, Covid) Anterior Nasal Swab Covid and flu negative [JK]  2336 CT without acute abnormalities.  [JK]  2336  Chest x-ray suggestive of possible infectious etiology right lung [JK]    Clinical Course User Index [JK] Dorie Rank, MD                           Medical Decision Making Problems Addressed: AKI (acute kidney injury) [facility]): acute illness or injury that poses a threat to life or bodily functions Pneumonia of right lung due to infectious organism, unspecified part of lung: acute illness or injury that poses a threat to life or bodily functions  Amount and/or Complexity of Data Reviewed Labs: ordered. Decision-making details documented in ED Course. Radiology: ordered and independent interpretation performed.   Patient presented ED for evaluation of confusion, weakness, altered mental status.  ED workup notable for elevated creatinine.  Patient also has evidence of possible pneumonia on x-ray.  Patient started on IV antibiotics and IV fluids.  {Document critical care time when appropriate:1} {Document review of labs and clinical decision tools ie heart score, Chads2Vasc2 etc:1}  {Document your independent review of radiology images, and any outside records:1} {Document your discussion with family members, caretakers, and with consultants:1} {Document social determinants of health affecting pt's care:1} {Document your decision making why or why not admission, treatments were needed:1} Final Clinical Impression(s) / ED Diagnoses Final diagnoses:  AKI (acute kidney injury) ([place])  Pneumonia of right lung due to infectious organism, unspecified part of lung    Rx / DC Orders ED Discharge Orders     None

## 2022-01-21 ENCOUNTER — Observation Stay (HOSPITAL_COMMUNITY): Payer: Medicare Other

## 2022-01-21 ENCOUNTER — Observation Stay: Payer: Self-pay

## 2022-01-21 ENCOUNTER — Encounter (HOSPITAL_COMMUNITY): Payer: Self-pay | Admitting: Internal Medicine

## 2022-01-21 DIAGNOSIS — Z794 Long term (current) use of insulin: Secondary | ICD-10-CM | POA: Diagnosis not present

## 2022-01-21 DIAGNOSIS — R918 Other nonspecific abnormal finding of lung field: Secondary | ICD-10-CM | POA: Diagnosis not present

## 2022-01-21 DIAGNOSIS — A419 Sepsis, unspecified organism: Secondary | ICD-10-CM | POA: Diagnosis present

## 2022-01-21 DIAGNOSIS — J189 Pneumonia, unspecified organism: Secondary | ICD-10-CM

## 2022-01-21 DIAGNOSIS — N184 Chronic kidney disease, stage 4 (severe): Secondary | ICD-10-CM | POA: Diagnosis present

## 2022-01-21 DIAGNOSIS — I6389 Other cerebral infarction: Secondary | ICD-10-CM | POA: Diagnosis not present

## 2022-01-21 DIAGNOSIS — N281 Cyst of kidney, acquired: Secondary | ICD-10-CM | POA: Diagnosis not present

## 2022-01-21 DIAGNOSIS — Z6841 Body Mass Index (BMI) 40.0 and over, adult: Secondary | ICD-10-CM | POA: Diagnosis not present

## 2022-01-21 DIAGNOSIS — G9341 Metabolic encephalopathy: Secondary | ICD-10-CM | POA: Diagnosis present

## 2022-01-21 DIAGNOSIS — Z8616 Personal history of COVID-19: Secondary | ICD-10-CM | POA: Diagnosis not present

## 2022-01-21 DIAGNOSIS — E7800 Pure hypercholesterolemia, unspecified: Secondary | ICD-10-CM | POA: Diagnosis present

## 2022-01-21 DIAGNOSIS — E1142 Type 2 diabetes mellitus with diabetic polyneuropathy: Secondary | ICD-10-CM | POA: Diagnosis present

## 2022-01-21 DIAGNOSIS — L89150 Pressure ulcer of sacral region, unstageable: Secondary | ICD-10-CM | POA: Diagnosis present

## 2022-01-21 DIAGNOSIS — E1122 Type 2 diabetes mellitus with diabetic chronic kidney disease: Secondary | ICD-10-CM | POA: Diagnosis present

## 2022-01-21 DIAGNOSIS — J9602 Acute respiratory failure with hypercapnia: Secondary | ICD-10-CM | POA: Diagnosis present

## 2022-01-21 DIAGNOSIS — J9811 Atelectasis: Secondary | ICD-10-CM | POA: Diagnosis not present

## 2022-01-21 DIAGNOSIS — I129 Hypertensive chronic kidney disease with stage 1 through stage 4 chronic kidney disease, or unspecified chronic kidney disease: Secondary | ICD-10-CM | POA: Diagnosis present

## 2022-01-21 DIAGNOSIS — Z20822 Contact with and (suspected) exposure to covid-19: Secondary | ICD-10-CM | POA: Diagnosis present

## 2022-01-21 DIAGNOSIS — Z515 Encounter for palliative care: Secondary | ICD-10-CM | POA: Diagnosis not present

## 2022-01-21 DIAGNOSIS — J9601 Acute respiratory failure with hypoxia: Secondary | ICD-10-CM | POA: Diagnosis present

## 2022-01-21 DIAGNOSIS — Z79899 Other long term (current) drug therapy: Secondary | ICD-10-CM | POA: Diagnosis not present

## 2022-01-21 DIAGNOSIS — Z66 Do not resuscitate: Secondary | ICD-10-CM | POA: Diagnosis present

## 2022-01-21 DIAGNOSIS — M109 Gout, unspecified: Secondary | ICD-10-CM | POA: Diagnosis present

## 2022-01-21 DIAGNOSIS — J690 Pneumonitis due to inhalation of food and vomit: Secondary | ICD-10-CM | POA: Diagnosis present

## 2022-01-21 DIAGNOSIS — R6521 Severe sepsis with septic shock: Secondary | ICD-10-CM | POA: Diagnosis present

## 2022-01-21 DIAGNOSIS — J4520 Mild intermittent asthma, uncomplicated: Secondary | ICD-10-CM | POA: Diagnosis present

## 2022-01-21 DIAGNOSIS — N179 Acute kidney failure, unspecified: Principal | ICD-10-CM | POA: Diagnosis present

## 2022-01-21 LAB — URINALYSIS, ROUTINE W REFLEX MICROSCOPIC
Bacteria, UA: NONE SEEN
Bilirubin Urine: NEGATIVE
Glucose, UA: NEGATIVE mg/dL
Ketones, ur: NEGATIVE mg/dL
Leukocytes,Ua: NEGATIVE
Nitrite: NEGATIVE
Protein, ur: 30 mg/dL — AB
Specific Gravity, Urine: 1.012 (ref 1.005–1.030)
pH: 6 (ref 5.0–8.0)

## 2022-01-21 LAB — COMPREHENSIVE METABOLIC PANEL
ALT: 22 U/L (ref 0–44)
AST: 27 U/L (ref 15–41)
Albumin: 2 g/dL — ABNORMAL LOW (ref 3.5–5.0)
Alkaline Phosphatase: 115 U/L (ref 38–126)
Anion gap: 13 (ref 5–15)
BUN: 36 mg/dL — ABNORMAL HIGH (ref 8–23)
CO2: 24 mmol/L (ref 22–32)
Calcium: 8.8 mg/dL — ABNORMAL LOW (ref 8.9–10.3)
Chloride: 100 mmol/L (ref 98–111)
Creatinine, Ser: 2.29 mg/dL — ABNORMAL HIGH (ref 0.44–1.00)
GFR, Estimated: 21 mL/min — ABNORMAL LOW (ref >60)
Glucose, Bld: 161 mg/dL — ABNORMAL HIGH (ref 70–99)
Potassium: 4.1 mmol/L (ref 3.5–5.1)
Sodium: 137 mmol/L (ref 135–145)
Total Bilirubin: 0.6 mg/dL (ref 0.3–1.2)
Total Protein: 4.7 g/dL — ABNORMAL LOW (ref 6.5–8.1)

## 2022-01-21 LAB — CBC
HCT: 32.5 % — ABNORMAL LOW (ref 36.0–46.0)
Hemoglobin: 10.4 g/dL — ABNORMAL LOW (ref 12.0–15.0)
MCH: 33.7 pg (ref 26.0–34.0)
MCHC: 32 g/dL (ref 30.0–36.0)
MCV: 105.2 fL — ABNORMAL HIGH (ref 80.0–100.0)
Platelets: 165 10*3/uL (ref 150–400)
RBC: 3.09 MIL/uL — ABNORMAL LOW (ref 3.87–5.11)
RDW: 21.2 % — ABNORMAL HIGH (ref 11.5–15.5)
WBC: 17 10*3/uL — ABNORMAL HIGH (ref 4.0–10.5)
nRBC: 0 % (ref 0.0–0.2)

## 2022-01-21 LAB — RESPIRATORY PANEL BY PCR

## 2022-01-21 LAB — GLUCOSE, CAPILLARY
Glucose-Capillary: 162 mg/dL — ABNORMAL HIGH (ref 70–99)
Glucose-Capillary: 176 mg/dL — ABNORMAL HIGH (ref 70–99)
Glucose-Capillary: 163 mg/dL — ABNORMAL HIGH (ref 70–99)

## 2022-01-21 LAB — FOLATE: Folate: 7.4 ng/mL (ref >5.9)

## 2022-01-21 LAB — LACTIC ACID, PLASMA
Lactic Acid, Venous: 2.3 mmol/L (ref 0.5–1.9)
Lactic Acid, Venous: 2.1 mmol/L (ref 0.5–1.9)
Lactic Acid, Venous: 1.9 mmol/L (ref 0.5–1.9)

## 2022-01-21 LAB — PROTIME-INR
INR: 2 — ABNORMAL HIGH (ref 0.8–1.2)
Prothrombin Time: 22.2 seconds — ABNORMAL HIGH (ref 11.4–15.2)

## 2022-01-21 LAB — PROCALCITONIN: Procalcitonin: 0.83 ng/mL

## 2022-01-21 LAB — TSH: TSH: 4.062 u[IU]/mL (ref 0.350–4.500)

## 2022-01-21 LAB — MRSA NEXT GEN BY PCR, NASAL: MRSA by PCR Next Gen: NOT DETECTED

## 2022-01-21 LAB — VITAMIN B12: Vitamin B-12: 833 pg/mL (ref 180–914)

## 2022-01-21 MED ORDER — SODIUM CHLORIDE 0.9 % IV SOLN: 3.0000 g | Freq: Four times a day (QID) | INTRAVENOUS | Status: DC

## 2022-01-21 MED ORDER — PIPERACILLIN-TAZOBACTAM 3.375 G IVPB
3.3750 g | Freq: Three times a day (TID) | INTRAVENOUS | Status: DC
  Administered 2022-01-21 – 2022-01-22 (×2): 3.375 g via INTRAVENOUS
  Filled 2022-01-21 (×2): qty 50

## 2022-01-21 MED ORDER — ENOXAPARIN SODIUM 150 MG/ML IJ SOSY
130.0000 mg | PREFILLED_SYRINGE | INTRAMUSCULAR | Status: DC
  Administered 2022-01-21: 130 mg via SUBCUTANEOUS
  Filled 2022-01-21 (×3): qty 0.86

## 2022-01-21 MED ORDER — HEPARIN (PORCINE) 25000 UT/250ML-% IV SOLN
1500.0000 [IU]/h | INTRAVENOUS | Status: AC
  Administered 2022-01-21: 1500 [IU]/h via INTRAVENOUS
  Filled 2022-01-21: qty 250

## 2022-01-21 MED ORDER — INSULIN ASPART 100 UNIT/ML IJ SOLN: 0.0000 [IU] | Freq: Every day | INTRAMUSCULAR | Status: DC

## 2022-01-21 MED ORDER — LACTATED RINGERS IV BOLUS
1000.0000 mL | Freq: Once | INTRAVENOUS | Status: AC
  Administered 2022-01-21: 1000 mL via INTRAVENOUS

## 2022-01-21 MED ORDER — INSULIN ASPART 100 UNIT/ML IJ SOLN
0.0000 [IU] | Freq: Three times a day (TID) | INTRAMUSCULAR | Status: DC
  Administered 2022-01-21: 4 [IU] via SUBCUTANEOUS

## 2022-01-21 MED ORDER — SODIUM CHLORIDE 0.9 % IV BOLUS
1000.0000 mL | Freq: Once | INTRAVENOUS | Status: AC
  Administered 2022-01-21: 1000 mL via INTRAVENOUS

## 2022-01-21 MED ORDER — SODIUM CHLORIDE 0.9 % IV SOLN
3.0000 g | Freq: Two times a day (BID) | INTRAVENOUS | Status: DC
  Administered 2022-01-21: 3 g via INTRAVENOUS
  Filled 2022-01-21: qty 8

## 2022-01-21 NOTE — ED Notes (Signed)
Clearance Coots, patient daughter, updated to patient status and current plan of care.

## 2022-01-21 NOTE — Progress Notes (Signed)
A consult was placed to IV Therapy again today for more access; pt pulled out the iv that had been started by IV Team earlier;  pt with extremely limited peripheral access; she currently has 1 iv for continuous heparin;  multiple bruises noted, as well as a skin tear on the left forearm;  attempted x 1 to place an ultrasound guided iv in the left upper arm without success;  suggest central access for this pt;  RN at bedside and is aware.

## 2022-01-21 NOTE — Progress Notes (Addendum)
HD#0 SUBJECTIVE:  Patient Summary: Ms. Lauren Webb is a 76 y/o female with a pmh of CKD stage IV,Degeneration of lumbar intervertebral disc ,Diabetes ,Hyperlipidemia ,Essential hypertension ,Mild intermittent asthma, uncomplicated Spondylolisthesis of lumbosacral region, OSA with BiPAP QHS,oropharyngeal dysphagia who presents from rehab for confusion.   Overnight Events:  naeo    Interm History:  Not able to provide due to mentation.  OBJECTIVE:  Vital Signs: Vitals:   01/21/22 1030 01/21/22 1032 01/21/22 1100 01/21/22 1131  BP:  (!) 97/58 (!) 92/59 (!) 99/52  Pulse:   (!) 113 (!) 108  Resp: (!) 23  (!) 22 20  Temp:   99.3 F (37.4 C) 99 F (37.2 C)  TempSrc:   Axillary Oral  SpO2: 96%  98% 97%  Weight:      Height:       Supplemental O2:  SpO2: 97 % O2 Flow Rate (L/min): 2 L/min  Filed Weights   01/15/2022 2116  Weight: 130 kg     Intake/Output Summary (Last 24 hours) at 01/21/2022 1256 Last data filed at 01/21/2022 5573 Gross per 24 hour  Intake 1350 ml  Output 1100 ml  Net 250 ml   Net IO Since Admission: 250 mL [01/21/22 1256]  Physical Exam: Gen: Chronically and acutely ill appearing, flushed, unkept, obese, lethargic HEENT: exophthalmos CV: RRR, normal s1/s2, no m/r/g, L  foot edema Pulm: rhonchi, diminished lung sounds throughout Abd: echymosis over the R abdominal wall, partially reducible umilical hernia, soft, NT,ND Extremities: cool, dry, 2+ pitting edema of the LLE with UNNA boot in place, no LE edema of the R side Skin: reduced turgor, diffuse ecchymoses over upper and lower extremities and abdominal wall, sacral wound unstageable, but does have black eschar. Surrounding erythema, does not appear infected Neuro: poor alertness, is only oriented partially to person, does not follow commands for cranial nerve testing, answers some things with yes/no questions, spontaneously opens eyes but does not move extremities. EOMI and no facial droop.  Patient  Lines/Drains/Airways Status     Active Line/Drains/Airways     Name Placement date Placement time Site Days   Peripheral IV 01/22/2022 20 G 1" Anterior;Proximal;Right Forearm 02/06/2022  2214  Forearm  1   Urethral Catheter Carina C, RN Non-latex 14 Fr. 01/21/22  0825  Non-latex  less than 1   Pressure Injury 01/21/22 Sacrum Bandaged placed on wound. 01/21/22  0300  -- less than 1            Pertinent Labs:    Latest Ref Rng & Units 01/21/2022    4:12 AM 02/06/2022   10:07 PM 01/22/2022    9:45 PM  CBC  WBC 4.0 - 10.5 K/uL 17.0   15.0   Hemoglobin 12.0 - 15.0 g/dL 10.4  10.5    10.2  10.7   Hematocrit 36.0 - 46.0 % 32.5  31.0    30.0  34.0   Platelets 150 - 400 K/uL 165   PLATELET CLUMPS NOTED ON SMEAR, COUNT APPEARS ADEQUATE        Latest Ref Rng & Units 01/21/2022    4:12 AM 01/25/2022   10:07 PM 04/06/2019    2:57 AM  CMP  Glucose 70 - 99 mg/dL 161  190  289   BUN 8 - 23 mg/dL 36  39  48   Creatinine 0.44 - 1.00 mg/dL 2.29  2.60  1.33   Sodium 135 - 145 mmol/L 137  134    134  139  Potassium 3.5 - 5.1 mmol/L 4.1  4.1    4.1  4.7   Chloride 98 - 111 mmol/L 100  100  104   CO2 22 - 32 mmol/L 24   26   Calcium 8.9 - 10.3 mg/dL 8.8   9.7   Total Protein 6.5 - 8.1 g/dL 4.7     Total Bilirubin 0.3 - 1.2 mg/dL 0.6     Alkaline Phos 38 - 126 U/L 115     AST 15 - 41 U/L 27     ALT 0 - 44 U/L 22       Recent Labs    01/22/2022 2201 01/21/22 1157  GLUCAP 187* 162*     Pertinent Imaging: US RENAL  Result Date: 01/21/2022 CLINICAL DATA:  Acute kidney injury EXAM: RENAL / URINARY TRACT ULTRASOUND COMPLETE COMPARISON:  None Available. FINDINGS: Right Kidney: Renal measurements: 10.1 x 4.7 x 5.2 cm = volume: 123.4 mL. Diffuse renal cortical thinning with lipomatous hypertrophy of the renal hilum. Circumscribed 1.0 x 1.3 x 1.2 cm anechoic simple cyst. No further imaging follow-up recommended. No hydronephrosis. Echogenic renal cortex. Left Kidney: Renal measurements: 10.2 x 4.8  x 6.2 = volume: 159 mL. Diffuse renal cortical thinning with lipomatous hypertrophy of the renal hilum. Echogenic renal cortex. No hydronephrosis. No mass. Bladder: Appears normal for degree of bladder distention. Other: None. IMPRESSION: 1. No evidence of hydronephrosis. 2. Marked renal cortical thinning bilaterally. 3. Echogenic renal parenchyma suggests underlying medical renal disease. 4. Small right renal simple cyst.  No further imaging follow-up. Electronically Signed   By: Jacqulynn Cadet M.D.   On: 01/21/2022 06:35   CT CHEST WO CONTRAST  Result Date: 01/21/2022 CLINICAL DATA:  Asymmetric airspace opacity on recent chest x-ray EXAM: CT CHEST WITHOUT CONTRAST TECHNIQUE: Multidetector CT imaging of the chest was performed following the standard protocol without IV contrast. RADIATION DOSE REDUCTION: This exam was performed according to the departmental dose-optimization program which includes automated exposure control, adjustment of the mA and/or kV according to patient size and/or use of iterative reconstruction technique. COMPARISON:  Chest x-ray from the previous day. FINDINGS: Cardiovascular: Somewhat limited due to lack of IV contrast. Atherosclerotic calcifications are noted. No aneurysmal dilatation is noted. Mild coronary calcifications are seen. Pulmonary artery as visualized is within normal limits. No cardiac enlargement is seen. Mediastinum/Nodes: Esophagus is within normal limits. No hilar or mediastinal adenopathy is noted. The thoracic inlet is unremarkable. Lungs/Pleura: Lungs are well aerated bilaterally. Bibasilar atelectatic changes are noted. No focal confluent infiltrate is seen. No sizable parenchymal nodules are noted. The parenchymal density seen on recent chest x-ray is not borne out on this exam and likely related to rotation. Upper Abdomen: Within normal limits. Musculoskeletal: Degenerative changes of the thoracic spine are noted. No acute rib abnormality is seen.  IMPRESSION: Mild bibasilar atelectatic changes. The parenchymal density seen in the right lung on the recent chest x-ray is not borne out on this exam and likely related to patient rotation. No other acute abnormality noted. Aortic Atherosclerosis (ICD10-I70.0). Electronically Signed   By: Inez Catalina M.D.   On: 01/21/2022 01:40   CT HEAD WO CONTRAST  Result Date: 01/26/2022 CLINICAL DATA:  Altered mental status EXAM: CT HEAD WITHOUT CONTRAST TECHNIQUE: Contiguous axial images were obtained from the base of the skull through the vertex without intravenous contrast. RADIATION DOSE REDUCTION: This exam was performed according to the departmental dose-optimization program which includes automated exposure control, adjustment of the mA and/or kV according  to patient size and/or use of iterative reconstruction technique. COMPARISON:  01/08/2022 FINDINGS: Brain: Normal anatomic configuration. Parenchymal volume loss is commensurate with the patient's age. Mild periventricular white matter changes are present likely reflecting the sequela of small vessel ischemia. 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 No abnormal intra or extra-axial mass lesion or fluid collection. No abnormal mass effect or midline shift. No evidence of acute intracranial hemorrhage or infarct. Ventricular size is normal. Cerebellum unremarkable. Vascular: No asymmetric hyperdense vasculature at the skull base. Skull: Intact Sinuses/Orbits: There is dense opacification of the left frontal sinus and several left ethmoid air cells, similar to prior examination. Small polyp within the left maxillary sinus again noted. Remaining paranasal sinuses are clear. Stable bilateral exophthalmos. Orbits are otherwise unremarkable. Other: There has developed fluid opacification of the left mastoid air cells. No superimposed osseous erosion. Middle ear cavities and right  mastoid air cells are clear. IMPRESSION: 1. No acute intracranial abnormality. 2. Stable senescent changes and remote lacunar infarcts. 3. Interval development of left mastoid effusion. 4. Stable left frontal and ethmoid sinus disease. 5. Stable bilateral exophthalmos. Electronically Signed   By: Fidela Salisbury M.D.   On: 01/30/2022 23:16   DG Chest 1 View  Result Date: 02/02/2022 CLINICAL DATA:  Altered mental status EXAM: CHEST  1 VIEW COMPARISON:  Chest radiograph and CT of 01/07/2022 FINDINGS: Stable mild elevation of the left hemidiaphragm. Asymmetric ground-glass opacity throughout the right lung has developed, possibly infectious or inflammatory in nature. Mild infiltrate is also noted within the retrocardiac region. No pneumothorax or pleural effusion. Bulbous appearance of the aortic knob is stable and related to the left pulmonary artery better seen on prior CT examination. Cardiac size within normal limits. No acute bone abnormality. IMPRESSION: Interval development of asymmetric ground-glass opacity throughout the right lung, possibly infectious or inflammatory in the acute setting. Stable pulmonary hypoinflation. Electronically Signed   By: Fidela Salisbury M.D.   On: 01/15/2022 23:08    ASSESSMENT/PLAN:  Assessment: Principal Problem:   AKI (acute kidney injury) ([place]) Active Problems:   Essential hypertension   Diabetes ([place])   Stage 3a chronic kidney disease (CKD) ([place])   Morbid obesity with BMI of 45.0-49.9, adult ([place])   Type 2 diabetes mellitus treated with insulin ([place])   Pneumonia of right lung due to infectious organism   Sepsis with encephalopathy without septic shock ([place])  [name] is a 76 y/o female with a pmh of CKD stage IV, Degeneration of lumbar intervertebral disc, Diabetes, Hyperlipidemia, Essential hypertension, Mild intermittent asthma, uncomplicated Spondylolisthesis of lumbosacral region, OSA with BiPAP QHS, oropharyngeal dysphagia who presents from rehab for  confusion, admitted for encephalopathy and concern for sepsis.  Acute Metabolic Encephalopathy likely secondary to sepsis Sepsis secondary to pneumonia  Procalcitonin was checked and returned elevated at 0.8 indicative of bacterial lower respiratory tract infection.  Right lung appears to be predominantly involves so will cover for aspiration at this time.  There was some suggestion that urinary tract infection was driving infection though UA is not support this.  Patient has multiple wounds 1 on her sacrum and 1 on her left lower extremity.  The sacral wound is at risk of infection given location, however neither wound appears to be overtly infected at this time. Patient with history of cognitive impairment, chronic lacunar infarcts and small vessel disease putting her at greater risk for encephalopathy and delirium in the setting of infection.  We will treat for bacterial pneumonia and monitor for improvement in mental status.  Will hold off on MRI at this time. - Initiate coverage with Unasyn 3 g every 6 hours - Blood cultures - Check urine strep pneumo antigen and Legionella antigen - 1 L LR bolus - Supplemental oxygen as necessary - AM CBC and BMP - will check for MRSA given recent hospitalizations   Pre-renal AKI on CKD Possible patient's current kidney function possibly related to acute illness, however marked cortical thinning bilaterally more reflective of  progression of chronic kidney disease.  We will volume resuscitate her for sepsis while simultaneously monitoring for improvement in renal function.  Of note patient had greater than 1 L urine output after Foley catheter placed but no hydronephrosis was noted on renal imaging. -Continue IV fluids -daily BMP -avoid nephrotoxic medications  Sacral and LLE wound - do not appear actively infected at this time. - consult to [facility]    Hx of bilateral PE 12/2021. -Hold Apixaban since patient failed swallow study -Heparin GGT for  anticoagulation  HTN -hold home blood pressure medicines in the setting of hypotension   T2DM Holding patient's home insulin regimen Will start on sliding scale   Chronic Medical Conditions:NPO, holding all meds Gout-Allopurinol '300mg'$  daily HLD-Rosuvastatin '20mg'$  daily Asthma-Montelukast '10mg'$  QHS, Albuterol 2 puffs every 6 hours as needed Duonebs every 6 hours as needed GERD-Omeprazole '20mg'$  daily Torsemide '10mg'$  daily     Dispo: Admit patient to Inpatient with expected length of stay greater than 2 midnights.  Delene Ruffini, MD

## 2022-01-21 NOTE — Progress Notes (Signed)
Pt admitted for AMS. A&O to self. Pt placed on tele NSR. Pt blood sugar 162. Pt BP (!) 99/52 (BP Location: Left Arm)   Pulse (!) 108   Temp 99 F (37.2 C) (Oral)   Resp 20   Ht '5\' 4"'$  (1.626 m)   Wt 130 kg   SpO2 97%   BMI 49.19 kg/m  Pt on 4L N/C. No c/o pain at this time. Pt skin is bruised throughout with a sacrum patch on bottom. Bed in lowest position, bed alarm on, 2/4 rails up. Unable to verbally interview patient due to lethargy. Louanne Skye 01/21/22 12:27 PM

## 2022-01-21 NOTE — Progress Notes (Signed)
   01/21/22 1846  Vitals  Temp (!) 100.4 F (38 C)  Temp Source Rectal  BP (!) 116/34  MAP (mmHg) (!) 57  BP Location Right Arm  BP Method Automatic  Patient Position (if appropriate) Lying  Pulse Rate (!) 101  Pulse Rate Source Monitor  ECG Heart Rate (!) 101  Resp (!) 33  Level of Consciousness  Level of Consciousness Unresponsive  MEWS COLOR  MEWS Score Color Red  Oxygen Therapy  SpO2 92 %  O2 Device Nasal Cannula  O2 Flow Rate (L/min) 3 L/min  Pain Assessment  Pain Scale Faces  Faces Pain Scale 0  Glasgow Coma Scale  Eye Opening 1  Best Verbal Response (NON-intubated) 1  Best Motor Response 4  Glasgow Coma Scale Score 6  MEWS Score  MEWS Temp 0  MEWS Systolic 0  MEWS Pulse 1  MEWS RR 2  MEWS LOC 3  MEWS Score 6   Pt Admitted to [facility] notified

## 2022-01-21 NOTE — Progress Notes (Signed)
ANTICOAGULATION CONSULT NOTE - Initial Consult  Pharmacy Consult for heparin Indication:  recent PE  Allergies  Allergen Reactions   Avelox [Moxifloxacin Hcl In Nacl] Other (See Comments)    weakness   Moxifloxacin Other (See Comments)    weakness   Nsaids     "Due to Kidney"   Other Other (See Comments)    "Due to Kidney" "Due to Kidney"    Patient Measurements: Height: '5\' 4"'$  (162.6 cm) Weight: 130 kg (286 lb 9.6 oz) IBW/kg (Calculated) : 54.7 Heparin Dosing Weight: 90kg  Vital Signs: Temp: 98.7 F (37.1 C) (12/09 0230) Temp Source: Oral (12/09 0230) BP: 101/64 (12/09 0230) Pulse Rate: 98 (12/09 0230)  Labs: Recent Labs    02/11/2022 2145 01/17/2022 2207  HGB 10.7* 10.5*  10.2*  HCT 34.0* 31.0*  30.0*  PLT PLATELET CLUMPS NOTED ON SMEAR, COUNT APPEARS ADEQUATE  --   CREATININE  --  2.60*    Estimated Creatinine Clearance: 23.9 mL/min (A) (by C-G formula based on SCr of 2.6 mg/dL (H)).   Medical History: Past Medical History:  Diagnosis Date   Allergic rhinitis    Diabetes ([facility])    GERD (gastroesophageal reflux disease)    High blood pressure    High cholesterol    Mild intermittent asthma, uncomplicated 9/67/5916    Assessment: 76yo female presents w/ worsening confusion, admitted with acute metabolic encephalopathy and AKI, to begin heparin for h/o recent PE; had been on Eliquis.  Goal of Therapy:  Heparin level 0.3-0.7 units/ml aPTT 66-102 seconds Monitor platelets by anticoagulation protocol: Yes   Plan:  Start heparin at 1500 units/hr. Monitor heparin levels, aPTT (while apixaban affects anti-Xa assay), and CBC.  Wynona Neat, PharmD, BCPS  01/21/2022,2:52 AM

## 2022-01-21 NOTE — H&P (Signed)
Date: 01/21/2022               Patient Name:  Lauren Webb MRN: [id number]  DOB: [birth year] Age / Sex: 76 y.o., female   PCP: Nelle Don, PA-C         Medical Service: Internal Medicine Teaching Service         Attending Physician: Lauren Webb    First Contact: Dr. Delene Ruffini, MD  Pager: [phone]  Second Contact: Dr. Sanjuana Letters, DO  Pager: [phone]       After Hours (After 5p/  First Contact Pager: [phone]  weekends / holidays): Second Contact Pager: [phone]   Chief Complaint: AMS  History of Present Illness:  Ms. Lauren Webb is a 76 y/o female with a pmh of CKD stage IV,Degeneration of lumbar intervertebral disc ,Diabetes ,Hyperlipidemia ,Essential hypertension ,Mild intermittent asthma, uncomplicated Spondylolisthesis of lumbosacral region, OSA with BiPAP QHS,oropharyngeal dysphagia who presents from rehab for confusion. Patient answers yes and no questions but overall has no clue why she is here and is not very alert and only partially oriented to self. Attempted to call daughter for collateral in the room who did not answer. The patient denies headache, lightheadedness/dizziness, CP,SOP,N/V, abdominal pain, diarrhea or constipation, dysuria, arthralgia, myalgia. She only endorses fatigue and new cough.  ED Course: Arrived from [facility] by EMS. Reported to have confusion for a few days, evaluated at [place] [facility] multiple times, family not happy and brought to [facility]. CT head with no acute intracranial process but remote lacunar infarcts and stable exophthalmos. CXR with asymmetric ground glass opacity throughout R lung, inflammatory vs infectious. Afebrile with tachycardia to 114, tachypnea to 24, BP soft to 89/42. Covid and flu negative. WBC elevated to 15. VBG wnl.  Meds: Allopurinol '300mg'$  daily Rosuvastatin '20mg'$  daily Lisinopril '10mg'$  daily Magnesium oxide '400mg'$  daily QHS Metamucil packet daily Montelukast '10mg'$  QHS Amlodipine '5mg'$   daily Omeprazole '20mg'$  daily Torsemide '10mg'$  daily Vitamin D3 1000 units daily Apixaban '5mg'$  twice daily Gabapentin '300mg'$  twice daily Novolog 70/30 40 units twice daily + Novolin SSI Albuterol 2 puffs every 6 hours as needed Xanax 0.'25mg'$  every 12 hours as needed Duonebs every 6 hours as needed Melatonin '5mg'$  daily as needed Miralax 1 packet daily as needed    Allergies: Allergies as of 02/10/2022 - Review Complete 02/04/2022  Allergen Reaction Noted   Avelox [moxifloxacin hcl in nacl] Other (See Comments) 02/19/2017   Moxifloxacin Other (See Comments) 02/19/2017   Nsaids  10/16/2018   Other Other (See Comments) 10/16/2018   Past Medical History:  Diagnosis Date   Allergic rhinitis    Diabetes ([place])    GERD (gastroesophageal reflux disease)    High blood pressure    High cholesterol    Mild intermittent asthma, uncomplicated 2/70/6237    Family History:  Family History  Problem Relation Age of Onset   Bone cancer Mother    Cancer Brother    Cancer Paternal Uncle    Heart disease Maternal Grandmother    Cancer Maternal Grandfather      Social History: Currently residing at [facility]. Otherwise unable to obtain history due to patients mental status and unable to reach family by fam as they were not present at the bedside.  Review of Systems: A complete ROS was negative except as per HPI.   Physical Exam: Blood pressure (!) 89/42, pulse (!) 102, temperature 98.2 F (36.8 C), temperature source Oral, resp. rate (!) 21, height 5'  4" (1.626 m), weight 130 kg, SpO2 100 %. Gen: Chronically and acutely ill appearing, flushed, unkept, obese, lethargic HEENT: Poor dentition, dry mucous membranes, conjunctival pallor CV: RRR, normal s1/s2, no m/r/g,cap refill>2 seconds, 2+ bilateral radial pulses, 2+ R DP pulse, L  foot pulses not palpable secondary to edema Pulm: audible congestion and productive cough at the bedside, only able to auscultate anterior lung fields  both of which were CTAB, normal WOB on RA Abd: echymosis over the R abdominal wall, partially reducible umilical hernia, soft, NT,ND Extremities: cool, dry, 2+ pitting edema of the LLE with UNNA boot in place, no LE edema of the R side Skin: reduced turgor, diffuse ecchymoses over upper and lower extremities and abdominal wall, UNABLE to evaluate sacral wound Neuro:poor alertness, is only oriented partially to person, does not follow commands for cranial nerve testing, answers some things with yes/no questions, spontaneously opens eyes but does not move extremities. EOMI and no facial droop.  EKG: personally reviewed my interpretation is erratic baseline and low voltage not able to accurately assess  CXR: personally reviewed my interpretation is diffuse interstitial opacification of the entire R lung  Assessment & Plan by Problem: Active Problems:   * No active hospital problems. * [name] is a 76 y/o female with a pmh of CKD stage IV,Degeneration of lumbar intervertebral disc ,Diabetes ,Hyperlipidemia ,Essential hypertension ,Mild intermittent asthma, uncomplicated Spondylolisthesis of lumbosacral region, OSA with BiPAP QHS,oropharyngeal dysphagia who presents from rehab for confusion. Acute Metabolic Encephalopathy Patient presented from rehab center with report of AMS for a few days. Unclear what her baseline is and family did not answer the phone when attempted to reach them. She has poor alertness, is only oriented partially to person, does not follow commands, answers some things with yes/no questions, spontaneously opens eyes but does not move extremities. No evidence of acute intracranial pathology on CT.She has exopthalmos and no listed diagnosis of graves disease and no antithyroid medications so will check a TSH to ensure that encephalopathy is not secondary to this. It is less likely given that she has not been on medications, atleast according to her MAR from rehab and prior office  visits outpatient. Will also follow up a B12 and folate given macrocytosis for completeness. She has xanax in her med list and according to the rehab [facility] has not taken this recently and is not on any other centrally acting meds. She does have a new AKI with creatinine of 2.6 and BUN of 39 although this does not seem elevated enough for uremic encephalopathy. Otherwise she has minimal hyponatremia to 134 and nomal potassium and calcium all non-contributory to her current mental status and presentation.She is afebrile, HDS, with minimal intermittent tachypnea and tachycardia, saturating 92-100% on RA and normal wob with clear lung sounds. No acidosis or alkalosis on VBG.Given her elevated WBC to 15.0 I suspect infection is driving her encephalopathy. Blood culture was not obtained prior to antibiotic administration.She has a cxr that demonstrates diffuse ground glass opacities of the R lung new from prior. She is COVID and Flu negative but suspect if this is infectious then is an atypical pneumonia vs RSV vs aspiration although CT chest only shows minimal bibasilar atelectasis. Will follow with a RPP. She denies dysuria but she is a poor historian. Will evaluate for UTI with UA. Alternatively she may have infection of a sacral wound given a history of this, but we were unable to visualize this overnight. 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 Of note do not know exact last known normal but appears to be days ago. -IV ceftriaxone -IV Azithromycin -F/u RPP -F/U UA -F/u TSH,B12,Folate -NPO -SLP consult -PT/OT when able to participate  Pre-renal AKI on CKD Baseline creatinine 1.1-1.4, found  to be 2.6 on I-stat given lab issues with the CMP. On exam she has conjunctival pallor, poor skin turgor, dry mucous membranes. Suspect poor PO intake recently in the setting of her AMS. Now s/p 1L and will order another 1L bolus since she is dry on exam and echo at [facility] done 12/2021 LVEF 55-60% no valvular dysfunction and no history of diastolic heart failure. Renal ultrasound without hydro but evidence of medical renal disease. -Consider maintenance IVF -daily BMP -avoid nephrotoxic medications  Hx of bilateral PE Found to have bilateral PE in 12/2021. -Hold Apixaban '5mg'$  twice daily -Heparin GGT  T2DM -Hold Novolog 70/30 40 units twice daily + Novolin SSI -Hold Gabapentin '300mg'$  twice daily  Chronic Medical Conditions:NPO, holding all meds Gout-Allopurinol '300mg'$  daily HLD-Rosuvastatin '20mg'$  daily HTN-Lisinopril '10mg'$  daily Amlodipine '5mg'$  daily Asthma-Montelukast '10mg'$  QHS, Albuterol 2 puffs every 6 hours as needed Duonebs every 6 hours as needed GERD-Omeprazole '20mg'$  daily Torsemide '10mg'$  daily   Dispo: Admit patient to Inpatient with expected length of stay greater than 2 midnights.  Signed: Iona Coach, MD 01/21/2022, 12:13 AM  Pager: [phone] After 5pm on weekdays and 1pm on weekends: On Call pager: [phone]

## 2022-01-21 NOTE — ED Notes (Signed)
Walked into room. Patient appears confused and unable to answer most questions. Patient can follow commands concerning movement. Patein does respond yes or no to some questions. Speech is unclear.

## 2022-01-21 NOTE — Hospital Course (Addendum)
Daughter reports patient was hospitalized for COVID pneumonia that she picked up at Rehab and was intubated briefly before being discharged  Dx: UTI noted on 12/01/21 Cognitive impairment Oropharyngeal dysphagia Type II DM HLD Chronic pain RLS GERD Sacral ulcer OSA   Meds: Allopurinol '300mg'$  daily Rosuvastatin '20mg'$  daily Lisinopril '10mg'$  daily Magnesium oxide '400mg'$  daily QHS Metamucil packet daily Montelukast '10mg'$  QHS Amlodipine '5mg'$  daily Omeprazole '20mg'$  daily Torsemide '10mg'$  daily Vitamin D3 1000 units daily Apixaban '5mg'$  twice daily Gabapentin '300mg'$  twice daily Novolog 70/30 40 units twice daily + Novolin SSI Albuterol 2 puffs every 6 hours as needed Xanax 0.'25mg'$  every 12 hours as needed Duonebs every 6 hours as needed Melatonin '5mg'$  daily as needed Miralax 1 packet daily as needed

## 2022-01-21 NOTE — ED Notes (Signed)
US at bedside

## 2022-01-21 NOTE — Progress Notes (Addendum)
Called daughter Josph Macho to update on patient status.  She reported that patient has had functional decline over past couple months with recurrent hospitalizations. States that patient was recently hospitalized for COVID pneumonia and briefly intubated for respiratory failure. Patient did not code during this hospitalization, but family had elected to avoid chest compressions if she were to code. Patient was discharged back to SNF where she resided for some time prior to being re-admitted to hospital for UTI. She underwent treatment inpatient and was discharged back to SNF on 12/7. Daughter notes that patient had been alert and conversational with occasional nonsensical comments. Current mental status much different from her baseline. Daughter confirms patient is to remain PARTIAL CODE with no chest compressions or shock. States that they would like to do intubation if necessary.

## 2022-01-21 NOTE — ED Notes (Signed)
Patient is resting in bed.

## 2022-01-21 NOTE — ED Notes (Signed)
IV team at bedside 

## 2022-01-21 NOTE — Evaluation (Signed)
Clinical/Bedside Swallow Evaluation Patient Details  Name: Lauren Webb MRN: [id number] Date of Birth: 17-Jun-1945  Today's Date: 01/21/2022 Time: SLP Start Time (ACUTE ONLY): 0735 SLP Stop Time (ACUTE ONLY): 0750 SLP Time Calculation (min) (ACUTE ONLY): 15 min  Past Medical History:  Past Medical History:  Diagnosis Date   Allergic rhinitis    Diabetes ([place])    GERD (gastroesophageal reflux disease)    High blood pressure    High cholesterol    Mild intermittent asthma, uncomplicated 3/61/4431   Past Surgical History:  Past Surgical History:  Procedure Laterality Date   GALLBLADDER SURGERY  1992   KIDNEY STONE SURGERY  2011   TUBAL LIGATION  1975   HPI:  76 y/o female with a pmh of CKD stage IV,Degeneration of lumbar intervertebral disc ,Diabetes ,Hyperlipidemia ,Essential hypertension ,Mild intermittent asthma, uncomplicated Spondylolisthesis of lumbosacral region, OSA with BiPAP QHS, oropharyngeal dysphagia who presents from rehab for confusion. Chest x ray Interval development of asymmetric ground-glass opacity throughoutthe right lung, possibly infectious or inflammatory in the acute  setting.    Assessment / Plan / Recommendation  Clinical Impression  Pt presents with a suspected cognitive based dysphagia presently. Pt would open eyes but not consistently following simple commands. Provided oral care, missing dentition noted (unable to visualize full oral cavity due to lack of pt ability following commands). Dried secretions were noted on pt lips and teeth. Trialed single ice chip via spoon. Pt with minimal mastication and no palpable swallow evidenced. Pt did have delayed wet vocal quality, suspect laryngeal invasion. Aspiration risk remains greatly increased. Will follow up for PO readiness. Continue NPO with meds via alternative means.  SLP Visit Diagnosis: Dysphagia, unspecified (R13.10);Dysphagia, oral phase (R13.11)    Aspiration Risk  Moderate aspiration risk;Risk for  inadequate nutrition/hydration    Diet Recommendation   NPO  Medication Administration: Via alternative means    Other  Recommendations Oral Care Recommendations: Oral care QID    Recommendations for follow up therapy are one component of a multi-disciplinary discharge planning process, led by the attending physician.  Recommendations may be updated based on patient status, additional functional criteria and insurance authorization.  Follow up Recommendations  (TBD)      Assistance Recommended at Discharge    Functional Status Assessment Patient has had a recent decline in their functional status and demonstrates the ability to make significant improvements in function in a reasonable and predictable amount of time.  Frequency and Duration min 2x/week  2 weeks       Prognosis Prognosis for Safe Diet Advancement: Good Barriers to Reach Goals: Cognitive deficits;Time post onset      Swallow Study   General Date of Onset: 02/04/2022 HPI: 76 y/o female with a pmh of CKD stage IV,Degeneration of lumbar intervertebral disc ,Diabetes ,Hyperlipidemia ,Essential hypertension ,Mild intermittent asthma, uncomplicated Spondylolisthesis of lumbosacral region, OSA with BiPAP QHS, oropharyngeal dysphagia who presents from rehab for confusion. Chest x ray Interval development of asymmetric ground-glass opacity throughoutthe right lung, possibly infectious or inflammatory in the acute  setting. Type of Study: Bedside Swallow Evaluation Previous Swallow Assessment: none on file Diet Prior to this Study: NPO Temperature Spikes Noted: No Respiratory Status: Nasal cannula History of Recent Intubation: No Behavior/Cognition: Lethargic/Drowsy;Requires cueing Oral Cavity Assessment: Dry Oral Care Completed by SLP: Yes Oral Cavity - Dentition: Missing dentition Self-Feeding Abilities: Needs assist;Total assist Patient Positioning: Upright in bed Baseline Vocal Quality: Low vocal intensity Volitional  Cough: Cognitively unable to elicit Volitional Swallow: Unable  to elicit    Oral/Motor/Sensory Function Overall Oral Motor/Sensory Function: Generalized oral weakness   Ice Chips Ice chips: Impaired Presentation: Spoon Oral Phase Impairments: Reduced labial seal;Reduced lingual movement/coordination Oral Phase Functional Implications: Prolonged oral transit Pharyngeal Phase Impairments: Suspected delayed Swallow;Unable to trigger swallow;Wet Vocal Quality   Thin Liquid Thin Liquid: Not tested    Nectar Thick Nectar Thick Liquid: Not tested   Honey Thick Honey Thick Liquid: Not tested   Puree Puree: Not tested   Solid     Solid: Not tested      Hayden Rasmussen MA, CCC-SLP Acute Rehabilitation Services   01/21/2022,7:59 AM

## 2022-01-21 NOTE — Progress Notes (Cosign Needed Addendum)
Subjective: Our team was paged by nursing staff for increased oxygen requirement and new-onset left-sided facial droop. Our team went to reassess the patient.  Patient was satting greater than 95% on 10 L nasal cannula with tachypnea and accessory muscle use.  Patient was able to open her eyes and stick out her tongue on command but was otherwise not able to follow commands. She was noted to have left-sided facial droop, flattening of her left nasolabial fold, and was favoring her left side. These neurological findings were not noted on documentation from earlier today.  0015: Paged by nursing staff for hypotension.  Ordered 1 L NS bolus.  Went to reassess the patient.  1 L NS bolus was running during my examination.  MAP in the 50s on arrival to the patient's room.  BP cuff was moved from right lower extremity to left upper extremity with improvement in MAP to 60s.  Will reassess the patient following completion of her fluid bolus. Patient now able to grip my fingers with her left hand on command. Patient weaned down to 4L [place] and is satting > 95%.   0130: Went to reassess the patient. IV fluid bolus completed. MAP still in the 60s. Continues to have transmitted upper airway sounds. Currently satting > 95% on 3L [place].   0315: Went to reassess the patient. MAP in the 70s. Continues to have transmitted upper airway sounds. Bed inclined at 35 degrees. Currently satting > 95% on 4L [place]. Patient appears uncomfortable. Will order 1X dose of IV tylenol. Brain MRI demonstrates punctate acute infarcts in the left frontal lobe, right parietal lobe, and right temporal lobe. I spoke with neurology over the phone who will order the stroke order set.     Objective: Gen: ill-appearing HEENT: exophthalmos, PERRL, extraocular muscles appear intact, no nuchal rigidity CV: Regular rate and rhythm, no murmurs rubs or gallops Pulm: increased work of breathing with accessory abdominal muscle use, transmitted upper airway  sounds, difficult to auscultate for rhonchi, no wheezes or crackles noted Abd: soft, normal bowel sounds Extremities: cool, 2+ pitting edema left lower extremity, trace edema of the right lower extremity Skin: dry, no rash Neuro: Poor alertness, opens eyes and sticks tongue out to verbal command, does not follow commands for cranial nerve, motor, or sensory testing, left-sided facial droop, flattening of the left nasolabial fold, favoring her left side   Plan: Patient's new onset left-sided facial droop is concerning for potential stroke. Patient had CT head done on 12/8 that was negative for acute intracranial abnormality.  Will order stat brain MRI given her recent CT and given that the cause of her acute metabolic encephalopathy is still unknown. Neurology was consulted and will see the patient. Neurology comfortable with brain MRI. Patient's increased oxygen requirements could potentially be secondary to aspiration in the setting of pre-existing pneumonia.  She is satting well on 10 L nasal cannula.  Will continue treating for bacterial pneumonia and titrate supplemental oxygen as necessary. -Brain MRI -Supplemental oxygen as necessary -Continue Zosyn -Still pending blood cultures

## 2022-01-21 NOTE — Progress Notes (Signed)
ANTICOAGULATION CONSULT NOTE - Follow Up Consult  Pharmacy Consult for IV heparin > Lovenox (Apixaban on hold) Indication:  hx recent PE (12/2021)  Allergies  Allergen Reactions   Avelox [Moxifloxacin] Other (See Comments)    Weakness    Nsaids Other (See Comments)    Kidney function    Patient Measurements: Height: '5\' 4"'$  (162.6 cm) Weight: 130 kg (286 lb 9.6 oz) IBW/kg (Calculated) : 54.7 Heparin Dosing Weight: 90 kg Lovenox dosing weight: 130 kg  Vital Signs: Temp: 97.9 F (36.6 C) (12/09 1400) Temp Source: Oral (12/09 1400) BP: 125/98 (12/09 1400) Pulse Rate: 89 (12/09 1400)  Labs: Recent Labs    01/13/2022 2145 01/16/2022 2207 01/21/22 0412  HGB 10.7* 10.5*  10.2* 10.4*  HCT 34.0* 31.0*  30.0* 32.5*  PLT PLATELET CLUMPS NOTED ON SMEAR, COUNT APPEARS ADEQUATE  --  165  LABPROT  --   --  22.2*  INR  --   --  2.0*  CREATININE  --  2.60* 2.29*    Estimated Creatinine Clearance: 27.1 mL/min (A) (by C-G formula based on SCr of 2.29 mg/dL (H)).  Assessment: 76yo female presents w/ worsening confusion, admitted with acute metabolic encephalopathy and AKI. Hx recent PE and on Eliquis PTA. Last dose 12/7 pm. Eliquis on hold, and IV heparin begun ~4am on 12/9. Unable to obtain labs for monitoring despite several attempts.  To change to Lovenox.    Goal of Therapy:  Heparin level 0.3-0.7 units/ml aPTT 66-102 seconds Anti-Xa level 0.6-1 units/ml 4hrs after LMWH dose given Monitor platelets by anticoagulation protocol: Yes   Plan:  Begin Lovenox 130 mg SQ q24h. IV heparin to stop when giving first Lovenox dose. Follow renal function for adjusting to q12h dosing when renal function improves. Follow up for resuming Eliquis when able to take PO meds.  Arty Baumgartner, RPh 01/21/2022,4:12 PM

## 2022-01-21 NOTE — ED Notes (Signed)
Help get patient cleaned up placed another brief help insert 14 fr cath got patient a war blanket patient is resting with call bell in reach

## 2022-01-21 NOTE — Progress Notes (Addendum)
Updated on Flowsheets all patients Admission Wounds  Pt breathing through mouth so mask applied due to Pt desating. O2 on 15 with Venturi mask  Pt skin temp elevated but low on Oral due to mouth breathing so rectal temp taken at 100.4  Pt rectum has wounds all around bottom and compacted with fecal matter. This RN attempted to remove surface fecal matter. Poop kept coming out. Soft, with minimal Bright Red ting to it. Pt Rectum has NO TONE. Rectum open 3 finger widths with ~3inches with Fecal Matter still in  This RN attempted to remove enough fecal matter to take an accurate Rectal temp of 100.4  Pt unresponsive on admission but After moving the patient, assessing skin, and settling patient, the patient is minimally responsive to pain and minimally groans  Will update oncoming RN

## 2022-01-21 NOTE — Progress Notes (Addendum)
Pharmacy Antibiotic Note  Lauren Webb is a 76 y.o. female to begin Unasyn for CAP vs aspiration pneumonia. AKI on CKD IV.    *1655 addendum:  changing from Unasyn to Zosyn for Pseudomonas coverage due to recent hospitalization with intubation.  Unasyn x 1 given.    Plan: Begin Unasyn 3gm IV q12h. Will follow renal function for adjusting dosing interval when renal function improves. Follow culture data, clinical progress, antibiotic plans. *1655 addendum: changing to Zosyn 3.375 gm IV q8h (each over 4 hours) for crcl >20 ml/min.  Will follow renal function for any need to adjust.   Height: '5\' 4"'$  (162.6 cm) Weight: 130 kg (286 lb 9.6 oz) IBW/kg (Calculated) : 54.7  Temp (24hrs), Avg:98.6 F (37 C), Min:97.6 F (36.4 C), Max:99.5 F (37.5 C)  Recent Labs  Lab 02/07/2022 2145 02/02/2022 2207 01/21/22 0013 01/21/22 0412  WBC 15.0*  --   --  17.0*  CREATININE  --  2.60*  --  2.29*  LATICACIDVEN  --   --  2.3* 2.1*    Estimated Creatinine Clearance: 27.1 mL/min (A) (by C-G formula based on SCr of 2.29 mg/dL (H)).    Allergies  Allergen Reactions   Avelox [Moxifloxacin] Other (See Comments)    Weakness    Nsaids Other (See Comments)    Kidney function    Antimicrobials this admission: Azithromycin and Ceftriaxone x 1 on 12/9 ~12-1am Unasyn x 1 dose on 12/9 Zosyn 12/9 >>  Dose adjustments this admission: n/a  Microbiology results: 12/8 respiratory PCR: negative 12/8 COVID, flu and RSV: negative 12/9 blood:  12/9 MRSA PCR: 12/9 Strep Ag:  12/9 Legionella Ag:   Thank you for allowing pharmacy to be a part of this patient's care.  Arty Baumgartner, [place] 01/21/2022 3:08 PM

## 2022-01-21 NOTE — Consult Note (Signed)
NEUROLOGY CONSULTATION NOTE   Date of service: January 21, 2022 Patient Name: Lauren Webb MRN:  [id number] DOB:  1945/08/29 Reason for consult: "consult for potential stroke" Requesting Provider: Lucious Groves, DO _ _ _   _ __   _ __ _ _  __ __   _ __   __ _  History of Present Illness  Lauren Webb is a 76 y.o. female with PMH significant for DM2, GERD, HLD, asthma, CKD4, HTN, recent PE on AC, OSA who is sent from her SNF with confusion, somnolence, fatigue and new cough.  Workup so far with CTH with remote strokes but nothing acute. CXR was concerning for a R lung opacity however, CT Chest w/o contrast which was obtained to calrify R Lung findings is negative for any R lung opacity and notable for mild bibasilar atelactasis. UA is not particularly concerning for a UTI. COVID and FLU neg, Respiratory viral panel negative. Procalcitonin is equivocal at 0.83,  with elevated lactate. Antibiotics were thus continues. Encephalopathy labs with normal B12, folate, TSH. She also has AKI and sacral ulcers and left lateral leg wounds.  She is on [facility] for hx of PE. It seems like she was switched to Heparin gtt and then full dose Lovenox probably due to inability to take home PO Eliquis. Unclear if she missed any doses of AC while in the ED.  At shift change today, patient was noted to have a new left facial droop with flattening of the nasolabial fold on the left. Patient is able to stick her tongue out on command for the team.  Neurology consulted for further evaluation of the noted focal deficit.   ROS   Constitutional Denies weight loss, fever and chills. ***  HEENT Denies changes in vision and hearing. ***  Respiratory Denies SOB and cough. ***  CV Denies palpitations and CP ***  GI Denies abdominal pain, nausea, vomiting and diarrhea. ***  GU Denies dysuria and urinary frequency. ***  MSK Denies myalgia and joint pain. ***  Skin Denies rash and pruritus. ***  Neurological Denies headache  and syncope. ***  Psychiatric Denies recent changes in mood. Denies anxiety and depression. ***   Past History   Past Medical History:  Diagnosis Date   Allergic rhinitis    Diabetes (HCC)    GERD (gastroesophageal reflux disease)    High blood pressure    High cholesterol    Mild intermittent asthma, uncomplicated 7/82/4235   Past Surgical History:  Procedure Laterality Date   GALLBLADDER SURGERY  1992   KIDNEY STONE SURGERY  2011   TUBAL LIGATION  1975   Family History  Problem Relation Age of Onset   Bone cancer Mother    Cancer Brother    Cancer Paternal Uncle    Heart disease Maternal Grandmother    Cancer Maternal Grandfather    Social History   Socioeconomic History   Marital status: Widowed    Spouse name: Not on file   Number of children: Not on file   Years of education: Not on file   Highest education level: Not on file  Occupational History   Not on file  Tobacco Use   Smoking status: Never   Smokeless tobacco: Never  Vaping Use   Vaping Use: Never used  Substance and Sexual Activity   Alcohol use: No   Drug use: No   Sexual activity: Not on file  Other Topics Concern   Not on file  Social History  Narrative   Not on file   Social Determinants of Health   Financial Resource Strain: Not on file  Food Insecurity: Not on file  Transportation Needs: Not on file  Physical Activity: Not on file  Stress: Not on file  Social Connections: Not on file   Allergies  Allergen Reactions   Avelox [Moxifloxacin] Other (See Comments)    Weakness    Nsaids Other (See Comments)    Kidney function    Medications   Medications Prior to Admission  Medication Sig Dispense Refill Last Dose   acetaminophen (TYLENOL) 325 MG tablet Take 650 mg by mouth every 4 (four) hours as needed (pain).   UNK   albuterol (VENTOLIN HFA) 108 (90 Base) MCG/ACT inhaler 2 puffs every 4-6 hours as needed (Patient taking differently: Inhale 2 puffs into the lungs every 6 (six)  hours as needed for shortness of breath.) 18 g 1 UNK   allopurinol (ZYLOPRIM) 300 MG tablet Take 300 mg by mouth every evening. (1700)   02/01/2022   ALPRAZolam (XANAX) 0.25 MG tablet Take 0.25 mg by mouth every 12 (twelve) hours as needed for anxiety.   UNK   apixaban (ELIQUIS) 5 MG TABS tablet Take 5 mg by mouth 2 (two) times daily.   01/19/2022 at 2000   Carbomer Gel Base (HYDROGEL) GEL Apply 1 application  topically daily. Clean open area to left/right ischium and sacrum with wound cleanser. Pat dry and apply hydrogel to wound bed, cover with dry dressing. (0700)   01/24/2022   cholecalciferol (VITAMIN D3) 25 MCG (1000 UNIT) tablet Take 1,000 Units by mouth in the morning. (0900)   Past Week   collagenase (SANTYL) 250 UNIT/GM ointment Apply 1 Application topically daily. To left buttock wound.  (0700)   02/10/2022   gabapentin (NEURONTIN) 300 MG capsule Take 300 mg by mouth 2 (two) times daily.   Past Week   insulin aspart protamine- aspart (NOVOLOG MIX 70/30) (70-30) 100 UNIT/ML injection Inject 40 Units into the skin 2 (two) times daily. (0900, 2100)   Past Week   insulin regular (NOVOLIN R) 100 units/mL injection Inject 0-10 Units into the skin See admin instructions. Inject 0-10 units  3 times daily with meals per sliding scale : BG < 150 : 0 units BG 151-200 : 2 units BG 201-250 : 4 units BG 251-300 : 6 units BG 301-350 : 8 units BG 351-400 : 10 units BG > 400 : 10 units, repeat BS in 2 hours. Notify MD if recheck BS is still > 400.   Past Week   ipratropium-albuterol (DUONEB) 0.5-2.5 (3) MG/3ML SOLN Take 3 mLs by nebulization every 6 (six) hours as needed (shortness of breath).   UNK   lisinopril (ZESTRIL) 5 MG tablet Take 5 mg by mouth in the morning. (0900)   Past Week   magnesium oxide (MAG-OX) 400 (240 Mg) MG tablet Take 400 mg by mouth at bedtime. (2100)   Past Week   melatonin 5 MG TABS Take 5 mg by mouth at bedtime as needed (insomnia).   UNK   Menthol-Zinc Oxide (CHAMOSYN EX)  Apply 1 application  topically 2 (two) times daily. Chamosyn moisture barrier ointment.  (0700, 1900)   01/13/2022   omeprazole (PRILOSEC OTC) 20 MG tablet Take 20 mg by mouth at bedtime. (2000)   Past Week   oxycodone (OXY-IR) 5 MG capsule Take 5 mg by mouth every 4 (four) hours as needed (moderate - severe pain).   01/16/2022   OXYGEN  Inhale 2 L/min into the lungs as needed (sats < 90%).   UNK   polyethylene glycol (MIRALAX / GLYCOLAX) 17 g packet Take 17 g by mouth daily as needed (constipation).   UNK   PROTEIN PO Take 30 mLs by mouth 2 (two) times daily. Protein liquid. (0800, 2000)   Past Week   psyllium (METAMUCIL) 58.6 % packet Take 1 packet by mouth every evening. (1700)   02/12/2022   rosuvastatin (CRESTOR) 20 MG tablet Take 20 mg by mouth at bedtime.   Past Week   senna-docusate (SENNA PLUS) 8.6-50 MG tablet Take 1 tablet by mouth every 12 (twelve) hours as needed (constipation).   UNK   cefTRIAXone (ROCEPHIN) 1 g injection Inject 1 g into the muscle once. (Patient not taking: Reported on 01/21/2022)   Completed Course   famotidine (PEPCID) 40 MG tablet Take 1 tablet (40 mg total) by mouth daily. (Patient not taking: Reported on 01/21/2022) 30 tablet 5 Not Taking   fluticasone (FLONASE) 50 MCG/ACT nasal spray 1 spray in each nostril 2 times per day (Patient not taking: Reported on 01/21/2022) 16 g 5 Not Taking   fluticasone (FLOVENT HFA) 110 MCG/ACT inhaler Inhale 2 puffs into the lungs 2 (two) times daily. (Patient not taking: Reported on 01/21/2022) 1 each 5 Not Taking   loratadine (CLARITIN) 10 MG tablet Take 1 tablet (10 mg total) by mouth daily. (Patient not taking: Reported on 01/21/2022) 30 tablet 5 Not Taking   montelukast (SINGULAIR) 10 MG tablet TAKE 1 TABLET BY MOUTH EVERYDAY AT BEDTIME (Patient not taking: Reported on 01/21/2022) 90 tablet 3 Not Taking   sodium polystyrene (KAYEXALATE) 15 GM/60ML suspension Take 15 g by mouth once. (Patient not taking: Reported on 01/21/2022)    Completed Course     Vitals   Vitals:   01/21/22 1846 01/21/22 1859 01/21/22 1900 01/21/22 2019  BP: (!) 116/34 111/69 (!) 115/42 (!) 103/58  Pulse: (!) 101   (!) 103  Resp: (!) 33 (!) 27 (!) 27 20  Temp: (!) 100.4 F (38 C)   100.3 F (37.9 C)  TempSrc: Rectal   Axillary  SpO2: 92%   94%  Weight:      Height:         Body mass index is 49.19 kg/m.  Physical Exam   General: Laying comfortably in bed; in no acute distress. *** HENT: Normal oropharynx and mucosa. Normal external appearance of ears and nose. *** Neck: Supple, no pain or tenderness *** CV: No JVD. No peripheral edema. *** Pulmonary: Symmetric Chest rise. Normal respiratory effort. *** Abdomen: Soft to touch, non-tender. *** Ext: No cyanosis, edema, or deformity *** Skin: No rash. Normal palpation of skin.  *** Musculoskeletal: Normal digits and nails by inspection. No clubbing. ***  Neurologic Examination  Mental status/Cognition: Alert, oriented to self, place, month and year, good attention. *** Speech/language: Fluent, comprehension intact, object naming intact, repetition intact. *** Cranial nerves:   CN II Pupils equal and reactive to light, no VF deficits ***   CN III,IV,VI EOM intact, no gaze preference or deviation, no nystagmus ***   CN V normal sensation in V1, V2, and V3 segments bilaterally ***   CN VII no asymmetry, no nasolabial fold flattening ***   CN VIII normal hearing to speech ***   CN IX & X normal palatal elevation, no uvular deviation ***   CN XI 5/5 head turn and 5/5 shoulder shrug bilaterally ***   CN XII midline tongue protrusion ***  Motor:  Muscle bulk: ***, tone ***, pronator drift *** tremor *** Mvmt Root Nerve  Muscle Right Left Comments  SA C5/6 Ax Deltoid     EF C5/6 Mc Biceps     EE C6/7/8 Rad Triceps     WF C6/7 Med FCR     WE C7/8 PIN ECU     F Ab C8/T1 U ADM/FDI     HF L1/2/3 Fem Illopsoas     KE L2/3/4 Fem Quad     DF L4/5 D Peron Tib Ant     PF S1/2 Tibial  Grc/Sol      Reflexes:  Right Left Comments  Pectoralis      Biceps (C5/6)     Brachioradialis (C5/6)      Triceps (C6/7)      Patellar (L3/4)      Achilles (S1)      Hoffman      Plantar     Jaw jerk    Sensation:  Light touch    Pin prick    Temperature    Vibration   Proprioception    Coordination/Complex Motor:  - Finger to Nose *** - Heel to shin *** - Rapid alternating movement *** - Gait: Stride length ***. Arm swing ***. Base width ***  Labs   CBC:  Recent Labs  Lab 01/19/2022 2145 02/04/2022 2207 01/21/22 0412  WBC 15.0*  --  17.0*  NEUTROABS 12.4*  --   --   HGB 10.7* 10.5*  10.2* 10.4*  HCT 34.0* 31.0*  30.0* 32.5*  MCV 106.3*  --  105.2*  PLT PLATELET CLUMPS NOTED ON SMEAR, COUNT APPEARS ADEQUATE  --  295    Basic Metabolic Panel:  Lab Results  Component Value Date   NA 137 01/21/2022   K 4.1 01/21/2022   CO2 24 01/21/2022   GLUCOSE 161 (H) 01/21/2022   BUN 36 (H) 01/21/2022   CREATININE 2.29 (H) 01/21/2022   CALCIUM 8.8 (L) 01/21/2022   GFRNONAA 21 (L) 01/21/2022   GFRAA 45 (L) 04/06/2019   Lipid Panel: No results found for: "LDLCALC" HgbA1c:  Lab Results  Component Value Date   HGBA1C 7.9 (H) 04/01/2019   Urine Drug Screen: No results found for: "LABOPIA", "COCAINSCRNUR", "LABBENZ", "AMPHETMU", "THCU", "LABBARB"  Alcohol Level No results found for: "ETH"  CT Head without contrast(Personally reviewed): ***  CT angio Head and Neck with contrast(Personally reviewed): ***  MR Angio head without contrast and Carotid Duplex BL(Personally reviewed): ***  MRI Brain(Personally reviewed): ***  rEEG:  ***  Impression   Lauren Webb is a 76 y.o. female with PMH significant for ***. Her neurologic examination is notable for ***.  Primary Diagnosis:  {Cerebral Infarction:22351}  Secondary Diagnosis: {Stroke Comorbidities:21266}  Recommendations   *** ______________________________________________________________________   Thank you for the opportunity to take part in the care of this patient. If you have any further questions, please contact the neurology consultation attending.  Signed,  [facility] [place] Pager Number [phone] _ _ _   _ __   _ __ _ _  __ __   _ __   __ _

## 2022-01-22 ENCOUNTER — Inpatient Hospital Stay (HOSPITAL_COMMUNITY): Payer: Medicare Other

## 2022-01-22 ENCOUNTER — Encounter (HOSPITAL_COMMUNITY): Payer: Medicare Other

## 2022-01-22 ENCOUNTER — Other Ambulatory Visit (HOSPITAL_COMMUNITY): Payer: Medicare Other

## 2022-01-22 DIAGNOSIS — N179 Acute kidney failure, unspecified: Secondary | ICD-10-CM

## 2022-01-22 DIAGNOSIS — G9341 Metabolic encephalopathy: Secondary | ICD-10-CM

## 2022-01-22 DIAGNOSIS — R6520 Severe sepsis without septic shock: Secondary | ICD-10-CM

## 2022-01-22 DIAGNOSIS — Z515 Encounter for palliative care: Secondary | ICD-10-CM

## 2022-01-22 DIAGNOSIS — A419 Sepsis, unspecified organism: Secondary | ICD-10-CM | POA: Diagnosis not present

## 2022-01-22 DIAGNOSIS — Z6841 Body Mass Index (BMI) 40.0 and over, adult: Secondary | ICD-10-CM

## 2022-01-22 DIAGNOSIS — E1122 Type 2 diabetes mellitus with diabetic chronic kidney disease: Secondary | ICD-10-CM

## 2022-01-22 DIAGNOSIS — Z7189 Other specified counseling: Secondary | ICD-10-CM

## 2022-01-22 DIAGNOSIS — N1831 Chronic kidney disease, stage 3a: Secondary | ICD-10-CM

## 2022-01-22 DIAGNOSIS — J189 Pneumonia, unspecified organism: Secondary | ICD-10-CM | POA: Diagnosis not present

## 2022-01-22 DIAGNOSIS — I6389 Other cerebral infarction: Secondary | ICD-10-CM

## 2022-01-22 DIAGNOSIS — I129 Hypertensive chronic kidney disease with stage 1 through stage 4 chronic kidney disease, or unspecified chronic kidney disease: Secondary | ICD-10-CM

## 2022-01-22 DIAGNOSIS — Z794 Long term (current) use of insulin: Secondary | ICD-10-CM

## 2022-01-22 LAB — BLOOD GAS, ARTERIAL
Acid-base deficit: 0.4 mmol/L (ref 0.0–2.0)
Bicarbonate: 26.7 mmol/L (ref 20.0–28.0)
Drawn by: 41875
O2 Saturation: 86.6 %
Patient temperature: 37
pCO2 arterial: 53 mmHg — ABNORMAL HIGH (ref 32–48)
pH, Arterial: 7.31 — ABNORMAL LOW (ref 7.35–7.45)
pO2, Arterial: 56 mmHg — ABNORMAL LOW (ref 83–108)

## 2022-01-22 LAB — CBC
HCT: 29.1 % — ABNORMAL LOW (ref 36.0–46.0)
Hemoglobin: 9.1 g/dL — ABNORMAL LOW (ref 12.0–15.0)
MCH: 33.3 pg (ref 26.0–34.0)
MCHC: 31.3 g/dL (ref 30.0–36.0)
MCV: 106.6 fL — ABNORMAL HIGH (ref 80.0–100.0)
Platelets: 174 10*3/uL (ref 150–400)
RBC: 2.73 MIL/uL — ABNORMAL LOW (ref 3.87–5.11)
RDW: 21.5 % — ABNORMAL HIGH (ref 11.5–15.5)
WBC: 22.8 10*3/uL — ABNORMAL HIGH (ref 4.0–10.5)
nRBC: 0 % (ref 0.0–0.2)

## 2022-01-22 LAB — ECHOCARDIOGRAM COMPLETE
AR max vel: 2.08 cm2
AV Area VTI: 1.87 cm2
AV Area mean vel: 2.13 cm2
AV Mean grad: 10 mmHg
AV Peak grad: 20.1 mmHg
Ao pk vel: 2.24 m/s
Height: 64 in
S' Lateral: 3 cm
Weight: 4585.57 oz

## 2022-01-22 LAB — AMMONIA: Ammonia: 30 umol/L (ref 9–35)

## 2022-01-22 LAB — LIPID PANEL
Cholesterol: 68 mg/dL (ref 0–200)
HDL: 22 mg/dL — ABNORMAL LOW (ref >40)
LDL Cholesterol: 23 mg/dL (ref 0–99)
Total CHOL/HDL Ratio: 3.1 RATIO
Triglycerides: 115 mg/dL (ref <150)
VLDL: 23 mg/dL (ref 0–40)

## 2022-01-22 LAB — BASIC METABOLIC PANEL
Anion gap: 13 (ref 5–15)
BUN: 42 mg/dL — ABNORMAL HIGH (ref 8–23)
CO2: 21 mmol/L — ABNORMAL LOW (ref 22–32)
Calcium: 8.5 mg/dL — ABNORMAL LOW (ref 8.9–10.3)
Chloride: 104 mmol/L (ref 98–111)
Creatinine, Ser: 2.3 mg/dL — ABNORMAL HIGH (ref 0.44–1.00)
GFR, Estimated: 21 mL/min — ABNORMAL LOW (ref >60)
Glucose, Bld: 194 mg/dL — ABNORMAL HIGH (ref 70–99)
Potassium: 4.7 mmol/L (ref 3.5–5.1)
Sodium: 138 mmol/L (ref 135–145)

## 2022-01-22 LAB — GLUCOSE, CAPILLARY
Glucose-Capillary: 199 mg/dL — ABNORMAL HIGH (ref 70–99)
Glucose-Capillary: 151 mg/dL — ABNORMAL HIGH (ref 70–99)

## 2022-01-22 MED ORDER — LACTATED RINGERS IV SOLN: INTRAVENOUS | Status: DC

## 2022-01-22 MED ORDER — MORPHINE SULFATE (PF) 2 MG/ML IV SOLN
0.2500 mg | Freq: Once | INTRAVENOUS | Status: AC
  Administered 2022-01-22: 0.25 mg via INTRAVENOUS
  Filled 2022-01-22: qty 1

## 2022-01-22 MED ORDER — LORAZEPAM 2 MG/ML IJ SOLN: 1.0000 mg | INTRAMUSCULAR | Status: DC | PRN

## 2022-01-22 MED ORDER — LACTATED RINGERS IV BOLUS
1000.0000 mL | Freq: Once | INTRAVENOUS | Status: AC
  Administered 2022-01-22: 1000 mL via INTRAVENOUS

## 2022-01-22 MED ORDER — GLYCOPYRROLATE 0.2 MG/ML IJ SOLN
0.2000 mg | INTRAMUSCULAR | Status: DC | PRN
  Administered 2022-01-22: 0.2 mg via INTRAVENOUS
  Filled 2022-01-22: qty 1

## 2022-01-22 MED ORDER — SODIUM CHLORIDE 0.9 % IV SOLN
2.0000 g | INTRAVENOUS | Status: DC
  Administered 2022-01-22: 2 g via INTRAVENOUS
  Filled 2022-01-22: qty 12.5

## 2022-01-22 MED ORDER — HYDROMORPHONE HCL-NACL 50-0.9 MG/50ML-% IV SOLN
0.5000 mg/h | INTRAVENOUS | Status: DC
  Administered 2022-01-22: 0.5 mg/h via INTRAVENOUS
  Filled 2022-01-22: qty 50

## 2022-01-22 MED ORDER — POLYVINYL ALCOHOL 1.4 % OP SOLN: 1.0000 [drp] | Freq: Four times a day (QID) | OPHTHALMIC | Status: DC | PRN

## 2022-01-22 MED ORDER — ACETAMINOPHEN 325 MG PO TABS: 650.0000 mg | ORAL_TABLET | Freq: Four times a day (QID) | ORAL | Status: DC | PRN

## 2022-01-22 MED ORDER — ONDANSETRON HCL 4 MG/2ML IJ SOLN: 4.0000 mg | Freq: Four times a day (QID) | INTRAMUSCULAR | Status: DC | PRN

## 2022-01-22 MED ORDER — LORAZEPAM 2 MG/ML PO CONC: 1.0000 mg | ORAL | Status: DC | PRN

## 2022-01-22 MED ORDER — HALOPERIDOL LACTATE 5 MG/ML IJ SOLN: 0.5000 mg | INTRAMUSCULAR | Status: DC | PRN

## 2022-01-22 MED ORDER — HYDROMORPHONE BOLUS VIA INFUSION: 0.5000 mg | INTRAVENOUS | Status: DC | PRN

## 2022-01-22 MED ORDER — ONDANSETRON 4 MG PO TBDP: 4.0000 mg | ORAL_TABLET | Freq: Four times a day (QID) | ORAL | Status: DC | PRN

## 2022-01-22 MED ORDER — ACETAMINOPHEN 650 MG RE SUPP: 650.0000 mg | Freq: Four times a day (QID) | RECTAL | Status: DC | PRN

## 2022-01-22 MED ORDER — ACETAMINOPHEN 10 MG/ML IV SOLN
1000.0000 mg | Freq: Once | INTRAVENOUS | Status: AC
  Administered 2022-01-22: 1000 mg via INTRAVENOUS
  Filled 2022-01-22: qty 100

## 2022-01-22 MED ORDER — BIOTENE DRY MOUTH MT LIQD: 15.0000 mL | OROMUCOSAL | Status: DC | PRN

## 2022-01-22 MED ORDER — GLYCOPYRROLATE 0.2 MG/ML IJ SOLN: 0.2000 mg | INTRAMUSCULAR | Status: DC | PRN

## 2022-01-22 NOTE — Progress Notes (Signed)
RT at bedside after receiving a call from RN due to low sats on NRB. Patient is now not responding, no gag reflex and having agonal breathing. RT spoke with CCM to make aware and MD confirmed patients DNR/DNI status. Patient is not a candidate for bipap due to secretions and unresponsiveness. No new orders for RT at this time. RT will continue to monitor as needed.

## 2022-01-22 NOTE — Consult Note (Signed)
WOC Nurse Consult Note: Reason for Consult:Right heel with Unstageable PI and left pretibial wound (full thickness). Photodocumentation of the LLE is provided to EMR at time of admission. Wound type:Pressure, trauma Pressure Injury POA: Yes Measurement: Left LE: 13cm x 5cm x 0.4cm red,. Moist, small serosanguinous Right heel Unstageable:  Bedside RN to measure today and document measurements on Nursing flow sheet Dressing procedure/placement/frequency: Patient is being turned and repositioned per house protocol, a sacral foam is in place and the heels are being floated. I have added bilateral pressure redistribution heel boots and topical care to the right heel using a betadine cleanse and dry dressing along with offloading. The LLE will be dressed with a xeroform gauze after cleaning and secured with Kerlix roll gauze/paper tape and changed daily. MD to provide additional guidance on sacral wound as needed.  Discussed with Medical team via secure chat this morning.  [facility] nursing team will not follow, but will remain available to this patient, the nursing and medical teams.  Please re-consult if needed.  Thank you for inviting [place] to participate in this patient's Plan of Care.  Maudie Flakes, MSN, RN, CNS, [place], Serita Grammes, [employer], [employer] phone:  [phone]

## 2022-01-22 NOTE — Plan of Care (Signed)
Noted that pt now in comfort care measures. Neurology will sign off. Please call with questions. Thanks for the consult.  Rosalin Hawking, MD PhD Stroke Neurology 01/22/2022 7:26 PM

## 2022-01-22 NOTE — Consult Note (Signed)
NAME:  Lauren Webb, MRN:  [id number], DOB:  February 21, 1945, LOS: 1 ADMISSION DATE:  02/03/2022, CONSULTATION DATE:  01/22/22 REFERRING MD:  Dr. Morrison Old, MD, CHIEF COMPLAINT:  Critically ill   History of Present Illness:  76 year old female SNF resident who presented with altered mental status, and cough. Admitted for working diagnosis of sepsis secondary to pneumonia and AKI.  Overnight had multiple events occur including hypotension and new left facial droop requiring Neuro consult, episode of suspected aspiration now requiring 15L O2 and now on Venturi mask. Hypotension has resolved however this has responded to 2L IVF.   Of note, chart reviewed with family commenting that patient has had functional decline over recent moths with multiple hospitalizations. Primary team confirmed that she is partial code with NO chest compressions or shock   Pertinent  Medical History  DM2, polyneuropathy, HTN, GERD, HLD, asthma, CKD4, HTN, recent PE on AC, OSA on BiPAP  Significant Hospital Events: Including procedures, antibiotic start and stop dates in addition to other pertinent events   12/10 PCCM consulted. Code status changed to DNR  Interim History / Subjective:  As above  Objective   Blood pressure (!) 113/47, pulse 84, temperature 97.6 F (36.4 C), temperature source Axillary, resp. rate (!) 22, height '5\' 4"'$  (1.626 m), weight 130 kg, SpO2 98 %.    FiO2 (%):  [55 %] 55 %   Intake/Output Summary (Last 24 hours) at 01/22/2022 0953 Last data filed at 01/22/2022 0600 Gross per 24 hour  Intake 200 ml  Output 325 ml  Net -125 ml   Filed Weights   01/14/2022 2116  Weight: 130 kg   Physical Exam: General: Critically ill-appearing, drowsy  HENT: [place], AT, Venturi mask Eyes: EOMI, no scleral icterus Respiratory: Coarse rhonchi bilaterally Cardiovascular: RRR, -M/R/G, no JVD GI: BS+, soft, nontender Extremities:-Edema,-tenderness Neuro: Drowsy, awakens for <1 min to sternal rub but  will fall asleep without interaction, does not follow commands Skin: Multiple wounds scattered on extremities   ABG 7.31/53/53 WBC 17>23  MR brain - Punctate acute infarcts in the left frontal lobe, right parietal lobe and right temporal lobe  Resolved Hospital Problem list   N/A  Assessment & Plan:   Acute hypoxemic respiratory failure secondary to aspiration Acute metabolic encephalopathy secondary to  new stroke hypoxemia, sepsis New stroke - not a candidate for lytics, chronic anticoagulation Patient is DNR/DNI ABG reviewed with very mild hypercarbia that does not fully contribute to her current status --Unfortunately patient not a candidate for BiPAP --Can trial heated high flow --With her encephalopathy and new stroke she is high risk for repeat aspiration --Would continue goals of care with family when they arrive. I have attempted to contact HCPOA x 2 and left voicemail --Agree with Palliative consult  --Neuro following for stroke work-up  Severe sepsis secondary to aspiration pneumonia vs pneumonitis --No indication for pressors at this time. S/p 2L IVF --Continue IVF resuscitation as needed for MAP goal >65 --OK with broad spectrum antibiotics. De-escalate pending culture data  AKI on CKD IV --Monitor UOP/Cr  Sacral and LLE wounds --Wound Care  Hx PE - unclear if missed doses of eliquis while she was deteriorating at SNF --Heparin  DM2 --STAT CBG  Pulmonary following Best Practice (right click and "Reselect all SmartList Selections" daily)   Diet/type: NPO DVT prophylaxis: systemic heparin GI prophylaxis: N/A Lines: N/A Foley:  Yes, and it is still needed Code Status:  DNR Last date of multidisciplinary goals of care  discussion [12/10]   Labs   CBC: Recent Labs  Lab 01/17/2022 2145 01/22/2022 2207 01/21/22 0412 01/22/22 0100  WBC 15.0*  --  17.0* 22.8*  NEUTROABS 12.4*  --   --   --   HGB 10.7* 10.5*  10.2* 10.4* 9.1*  HCT 34.0* 31.0*  30.0*  32.5* 29.1*  MCV 106.3*  --  105.2* 106.6*  PLT PLATELET CLUMPS NOTED ON SMEAR, COUNT APPEARS ADEQUATE  --  165 027    Basic Metabolic Panel: Recent Labs  Lab 01/19/2022 2207 01/21/22 0412 01/22/22 0100  NA 134*  134* 137 138  K 4.1  4.1 4.1 4.7  CL 100 100 104  CO2  --  24 21*  GLUCOSE 190* 161* 194*  BUN 39* 36* 42*  CREATININE 2.60* 2.29* 2.30*  CALCIUM  --  8.8* 8.5*   GFR: Estimated Creatinine Clearance: 27 mL/min (A) (by C-G formula based on SCr of 2.3 mg/dL (H)). Recent Labs  Lab 01/31/2022 2145 01/21/22 0013 01/21/22 0412 01/21/22 2055 01/22/22 0100  PROCALCITON  --   --  0.83  --   --   WBC 15.0*  --  17.0*  --  22.8*  LATICACIDVEN  --  2.3* 2.1* 1.9  --     Liver Function Tests: Recent Labs  Lab 01/21/22 0412  AST 27  ALT 22  ALKPHOS 115  BILITOT 0.6  PROT 4.7*  ALBUMIN 2.0*   No results for input(s): "LIPASE", "AMYLASE" in the last 168 hours. Recent Labs  Lab 01/22/22 0548  AMMONIA 30    ABG    Component Value Date/Time   PHART 7.31 (L) 01/22/2022 0846   PCO2ART 53 (H) 01/22/2022 0846   PO2ART 56 (L) 01/22/2022 0846   HCO3 26.7 01/22/2022 0846   TCO2 28 02/11/2022 2207   TCO2 28 01/24/2022 2207   ACIDBASEDEF 0.4 01/22/2022 0846   O2SAT 86.6 01/22/2022 0846     Coagulation Profile: Recent Labs  Lab 01/21/22 0412  INR 2.0*    Cardiac Enzymes: No results for input(s): "CKTOTAL", "CKMB", "CKMBINDEX", "TROPONINI" in the last 168 hours.  HbA1C: Hgb A1c MFr Bld  Date/Time Value Ref Range Status  04/01/2019 03:06 AM 7.9 (H) 4.8 - 5.6 % Final    Comment:    (NOTE) Pre diabetes:          5.7%-6.4% Diabetes:              >6.4% Glycemic control for   <7.0% adults with diabetes     CBG: Recent Labs  Lab 02/07/2022 2201 01/21/22 1157 01/21/22 1627 01/21/22 2132 01/22/22 0605  GLUCAP 187* 162* 176* 163* 199*    Review of Systems:   Unable to obtain due to critical illness   Past Medical History:  She,  has a past medical  history of Allergic rhinitis, Diabetes ([place]), GERD (gastroesophageal reflux disease), High blood pressure, High cholesterol, and Mild intermittent asthma, uncomplicated (2/53/6644).   Surgical History:   Past Surgical History:  Procedure Laterality Date   [place]  2011   TUBAL LIGATION  1975     Social History:   reports that she has never smoked. She has never used smokeless tobacco. She reports that she does not drink alcohol and does not use drugs.   Family History:  Her family history includes Bone cancer in her mother; Cancer in her brother, maternal grandfather, and paternal uncle; Heart disease in her maternal grandmother.   Allergies Allergies  Allergen Reactions   Avelox [Moxifloxacin] Other (See Comments)    Weakness    Nsaids Other (See Comments)    Kidney function     Home Medications  Prior to Admission medications   Medication Sig Start Date End Date Taking? Authorizing Provider  acetaminophen (TYLENOL) 325 MG tablet Take 650 mg by mouth every 4 (four) hours as needed (pain).   Yes [provider]  albuterol (VENTOLIN HFA) 108 (90 Base) MCG/ACT inhaler 2 puffs every 4-6 hours as needed Patient taking differently: Inhale 2 puffs into the lungs every 6 (six) hours as needed for shortness of breath. 05/10/20  Yes Kozlow, Donnamarie Poag, MD  allopurinol (ZYLOPRIM) 300 MG tablet Take 300 mg by mouth every evening. (1700)   Yes [provider]  ALPRAZolam (XANAX) 0.25 MG tablet Take 0.25 mg by mouth every 12 (twelve) hours as needed for anxiety.   Yes [provider]  apixaban (ELIQUIS) 5 MG TABS tablet Take 5 mg by mouth 2 (two) times daily.   Yes [provider]  Carbomer Gel Base (HYDROGEL) GEL Apply 1 application  topically daily. Clean open area to left/right ischium and sacrum with wound cleanser. Pat dry and apply hydrogel to wound bed, cover with dry dressing. (0700)   Yes [provider]   cholecalciferol (VITAMIN D3) 25 MCG (1000 UNIT) tablet Take 1,000 Units by mouth in the morning. (0900)   Yes [provider]  collagenase (SANTYL) 250 UNIT/GM ointment Apply 1 Application topically daily. To left buttock wound.  (0700)   Yes [provider]  gabapentin (NEURONTIN) 300 MG capsule Take 300 mg by mouth 2 (two) times daily.   Yes [provider]  insulin aspart protamine- aspart (NOVOLOG MIX 70/30) (70-30) 100 UNIT/ML injection Inject 40 Units into the skin 2 (two) times daily. (0900, 2100)   Yes [provider]  insulin regular (NOVOLIN R) 100 units/mL injection Inject 0-10 Units into the skin See admin instructions. Inject 0-10 units  3 times daily with meals per sliding scale : BG < 150 : 0 units BG 151-200 : 2 units BG 201-250 : 4 units BG 251-300 : 6 units BG 301-350 : 8 units BG 351-400 : 10 units BG > 400 : 10 units, repeat BS in 2 hours. Notify MD if recheck BS is still > 400.   Yes [provider]  ipratropium-albuterol (DUONEB) 0.5-2.5 (3) MG/3ML SOLN Take 3 mLs by nebulization every 6 (six) hours as needed (shortness of breath).   Yes [provider]  lisinopril (ZESTRIL) 5 MG tablet Take 5 mg by mouth in the morning. (0900)   Yes [provider]  magnesium oxide (MAG-OX) 400 (240 Mg) MG tablet Take 400 mg by mouth at bedtime. (2100)   Yes [provider]  melatonin 5 MG TABS Take 5 mg by mouth at bedtime as needed (insomnia).   Yes [provider]  Menthol-Zinc Oxide (CHAMOSYN EX) Apply 1 application  topically 2 (two) times daily. Chamosyn moisture barrier ointment.  (0700, 1900)   Yes [provider]  omeprazole (PRILOSEC OTC) 20 MG tablet Take 20 mg by mouth at bedtime. (2000)   Yes [provider]  oxycodone (OXY-IR) 5 MG capsule Take 5 mg by mouth every 4 (four) hours as needed (moderate - severe pain).   Yes [provider]  OXYGEN Inhale 2 L/min into the  lungs as needed (sats < 90%).   Yes [provider]  polyethylene glycol (MIRALAX /  GLYCOLAX) 17 g packet Take 17 g by mouth daily as needed (constipation).   Yes [provider]  PROTEIN PO Take 30 mLs by mouth 2 (two) times daily. Protein liquid. (0800, 2000)   Yes [provider]  psyllium (METAMUCIL) 58.6 % packet Take 1 packet by mouth every evening. (1700)   Yes [provider]  rosuvastatin (CRESTOR) 20 MG tablet Take 20 mg by mouth at bedtime.   Yes [provider]  senna-docusate (SENNA PLUS) 8.6-50 MG tablet Take 1 tablet by mouth every 12 (twelve) hours as needed (constipation).   Yes [provider]  cefTRIAXone (ROCEPHIN) 1 g injection Inject 1 g into the muscle once. Patient not taking: Reported on 01/21/2022    [provider]  famotidine (PEPCID) 40 MG tablet Take 1 tablet (40 mg total) by mouth daily. Patient not taking: Reported on 01/21/2022 05/10/20   Jiles Prows, MD  fluticasone [facility]) 50 MCG/ACT nasal spray 1 spray in each nostril 2 times per day Patient not taking: Reported on 01/21/2022 05/10/20   Jiles Prows, MD  fluticasone (FLOVENT HFA) 110 MCG/ACT inhaler Inhale 2 puffs into the lungs 2 (two) times daily. Patient not taking: Reported on 01/21/2022 05/10/20   Jiles Prows, MD  loratadine (CLARITIN) 10 MG tablet Take 1 tablet (10 mg total) by mouth daily. Patient not taking: Reported on 01/21/2022 05/10/20   Jiles Prows, MD  montelukast (SINGULAIR) 10 MG tablet TAKE 1 TABLET BY MOUTH EVERYDAY AT BEDTIME Patient not taking: Reported on 01/21/2022 07/16/20   Jiles Prows, MD  sodium polystyrene (KAYEXALATE) 15 GM/60ML suspension Take 15 g by mouth once. Patient not taking: Reported on 01/21/2022    [provider]     Critical care time: 35 min     The patient is critically ill with multiple organ systems failure and requires high complexity decision making for assessment and support,  frequent evaluation and titration of therapies, application of advanced monitoring technologies and extensive interpretation of multiple databases.  Independent Critical Care Time: 35 Minutes.   Rodman Pickle, M.D. [facility] Pulmonary/Critical Care Medicine 01/22/2022 10:27 AM   Please see Amion for pager number to reach on-call Pulmonary and Critical Care Team.

## 2022-01-22 NOTE — Progress Notes (Signed)
Reevaluated patient at 1325. Her blood pressures having improved with MAP's in the 70's however her breathing has worsened and she is requiring 15L high flow with the non-re breather. She is no longer following commands, lethargic, and tachypneic.    Called [employer] and discussed worsening breathing status, he will be at the hospital shortly. Also updated patient's daughter.

## 2022-01-22 NOTE — Progress Notes (Addendum)
Evaluated this morning after being found to have multiple CVA's on MRI overnight. Unclear if she was taking her eliquis prior ot hospitalization as she was having difficulty swallowing. She also was having persistent hypotension with MAP's in the 50-60's requiring frequent IV bolus's. Her mentation seems somewhat improved this morning on evaluation, she is alert but unable to answer my questions consistently. She continues to have increased work of breathing with accessory muscle use on 10L on non-rebreather and tachypnea. She has diffuse rhonchi and upper airway sounds. She has worsening anasarca compared to yesterday. When asked if she can move her left extremity she is unable to.   Her antibiotics were broadened to zosyn for her sepsis and worsening clinical picture, still unclear of etiology (aspiration pneumonia vs chronic sacral wounds). Again, multiple bouts of antibiotics recently. I have been unable to reach her [place] to make a decision in regards to goals of care. She had a recent hospitalization for COVID pneumonia where she was a partial code not wanting compressions. As I have not been able to reach him this morning I have reached out to [facility] for further assistance with her persistent hypotension and worsening airway status. We will continue to manager her blood pressures with IV bolus' while we try to reach family, patient too encephalopathic to make her own decisions. Guarded overall prognosis with multiple hospitalizations, sepsis, worsening respiratory status and multiple new CVA's with now unclear neurological baseline secondary to her encephalopathy. Have place palliative care consult as well.   Addendum:  Spoke with Luz Lex, he has confirmed DNR/DNI status. He agrees that she would not do well with intubation and aggressive care such as CPR and ACLS. He is not yet ready to transition to comfort care and would like to continue care otherwise. Will continue [place]  conversations today once Thurmond Butts is at the hospital. Overall poor prognosis with worsening respiratory status. Evaluated by Dr. Loanne Drilling of PCCM who agrees with plan, appreciate her timely consult and evaluation.

## 2022-01-22 NOTE — Consult Note (Signed)
Palliative Care Consult Note                                  Date: 01/22/2022   Patient Name: Lauren Webb  DOB: 1945/07/27  MRN: [id number]  Age / Sex: 76 y.o., female  PCP: Lauren Don, PA-C Referring Physician: Lucious Groves, DO  Reason for Consultation: Establishing goals of care  HPI/Patient Profile: 76 y.o. female  with past medical history of CKD stage IV, degeneration of lumbar intervertebral disc, diabetes type 2, mild intermittent asthma, hypertension, OSA on BiPAP QHS, oropharyngeal dysphagia, and uncomplicated spondylolisthesis of lumbosacral region. She presented to [facility] ED on 02/11/2022 with altered mental status. Admitted to Internal Medicine TS with acute encephalopathy and concern for sepsis due to pneumonia versus sacral wound.  Palliative Medicine was consulted for goals of care.   Past Medical History:  Diagnosis Date   Allergic rhinitis    Diabetes (HCC)    GERD (gastroesophageal reflux disease)    High blood pressure    High cholesterol    Mild intermittent asthma, uncomplicated 3/76/2831    Subjective:   I have reviewed medical records including progress notes, labs and imaging. Patient with new-onset left-sided facial droop and increased oxygen requirement overnight. Found to have multiple acute CVAs on MRI. She was also having persistent hypotension requiring multiple IV boluses.   I assessed patient at bedside. She opens her eyes to voice but only briefly. She does not follow commands for me. She is on a venturi mask and breathing is slightly labored.   13:47 - Discussed with Dr. Johnney Webb via secure chat. He states that patient's BP has improved but breathing has worsened. Family is on the way to the hospital.   16:05 - Family has arrived and I met with them at bedside. Daughter/Lauren Webb, nephew/HCPOA Lauren Webb, and 2 other family members are present. Myself and Dr. Johnney Webb met with them at bedside.   We  discussed patient's current medical situation. Reviewed that patient had suffered multiple strokes overnight and likely aspirated. Discussed her worsening respiratory status. We shared our concern that patient appears to be at end of life.   The difference between full scope medical intervention and comfort care was considered. Discussed that comfort care involves de-escalating and stopping full scope medical interventions, allowing a natural course to occur. Discussed that the goal is comfort and dignity rather than cure/prolonging life.   Discussed that comfort care would mean discontinuing labs, cardiac monitoring, IV fluids, antibiotics, and unnecessary medications. It would also include administering medication for managing symptoms such as pain and dyspnea.   Created space and opportunity for family to express thoughts regarding patient's current medical situation. Family is clear they want to focus on [name]'s comfort/dignity and alleviate her dyspnea. They are agreeable to transition to comfort care at this time. Discussed starting an infusion of pain medication and then weaning down oxygen as tolerated (she is currently on NRB mask).   Provided education on natural trajectory at end of life. Emotional support provided.    Review of Systems  Unable to perform ROS   Objective:   Primary Diagnoses: Present on Admission:  AKI (acute kidney injury) ([place])  Essential hypertension  Stage 3a chronic kidney disease (CKD) ([place])  Sepsis ([place])   Physical Exam Constitutional:      General: She is not in acute distress.    Appearance: She is obese. She is ill-appearing.  Cardiovascular:     Rate and Rhythm: Normal rate.  Pulmonary:     Effort: Accessory muscle usage present.     Comments: Increased work of breathing Neurological:     Comments: Minimally responsive     Vital Signs:  BP (!) 112/44   Pulse 83   Temp 97.6 F (36.4 C) (Axillary)   Resp (!) 38   Ht _0  (1.626 m)    Wt 130 kg   SpO2 96%   BMI 49.19 kg/m   Palliative Assessment/Data: PPS 10%     Assessment & Plan:   SUMMARY OF RECOMMENDATIONS   Transition to full comfort care Start dilaudid infusion Wean down oxygen as tolerated (currently on NRB mask) D/C labs, cardiac monitoring, antibiotics, IV fluids PMT will follow up tomorrow  Symptom Management:  Lorazepam (ATIVAN) prn for anxiety Haloperidol (HALDOL) prn for agitation  Glycopyrrolate (ROBINUL) for excessive secretions Ondansetron (ZOFRAN) prn for nausea Polyvinyl alcohol (LIQUIFILM TEARS) prn for dry eyes Antiseptic oral rinse (BIOTENE) prn for dry mouth   Primary Decision Maker: Lauren Webb (nephew and HCPOA)  Code Status/Advance Care Planning: DNR/DNI  Prognosis:  Hours - Days  Discharge Planning:  Anticipated Hospital Death    MDM - High  Signed by: Elie Confer, NP Palliative Medicine Team  Team Phone # [phone]  For individual providers, please see AMION

## 2022-01-22 NOTE — IPAL (Addendum)
  Interdisciplinary Goals of Care Family Meeting   Date carried out: 01/22/2022  Location of the meeting: Bedside  Member's involved: Physician, Family Member or next of kin, and Palliative care team member  Durable Power of Attorney or acting medical decision maker: Crissie Sickles    Discussion: We discussed goals of care for Judge Stall .  Multiple discussions had been had earlier in the day, so most family members at bedside were already aware of Ms. Mountjoy's illness and the severity of her current state. Prior to this meeting they determined she would be a DNR/DNI but were uncertain about comfort care measures. NP Mcl[place]ham at bedside and assisted in the process. Families questions were answered in regards to patient's hospital course, comfort care, and plan moving forward. They agree with comfort measures at this time.   Code status: Full DNR  Disposition: In-patient comfort care, likely in hospital death  Time spent for the meeting: 30  Appreciate assistance of NP McI[place]ham and Dr. Zannie Cove DO  Internal Medicine Resident PGY-3 [place]  Pager: [phone]

## 2022-01-22 NOTE — Progress Notes (Signed)
HD#1 Subjective:   Summary: Ms. Lauren Webb is a 76 y/o female with a pmh of CKD stage IV,Degeneration of lumbar intervertebral disc ,Diabetes ,Hyperlipidemia ,Essential hypertension ,Mild intermittent asthma, uncomplicated Spondylolisthesis of lumbosacral region, OSA with BiPAP QHS,oropharyngeal dysphagia who presents from rehab for confusion.   Overnight Events: Hypotensive requiring multiple LR bolus and favoring left side and left sided facial droop found to have multiple new CVA's.  Multiple complications overnight including new findings of an acute CVA and continued hypotension secondary to her sepsis. She continues to be encephalopathic and unable to make her own decisions. Spoke with daughter multiple times this morning as well as HCPOA nephew Lauren Webb. Please see note from earlier today, we are awaiting family arrival to have further discussions.   Objective:  Vital signs in last 24 hours: Vitals:   01/22/22 1015 01/22/22 1045 01/22/22 1100 01/22/22 1118  BP: (!) 112/44 (!) 98/42 (!) 89/61 (!) 91/54  Pulse: 83 79 82 85  Resp: (!) 38 20 (!) 28 (!) 21  Temp:      TempSrc:      SpO2: 96% 96% 91% 100%  Weight:      Height:       Supplemental O2: Simple Face Mask SpO2: 100 % O2 Flow Rate (L/min): 15 L/min FiO2 (%): 55 %   Physical Exam:  Constitutional: ill appearing, responds to verbal and painful stimuli HENT: non rebreather in place Eyes: conjunctiva non-erythematous Neck: supple Cardiovascular: regular rate and rhythm, no m/r/g Pulmonary/Chest: accessory muscle use on supplemental O2. Tachypnic. Diffuse rhonchi bilaterally Abdominal: obese, distended Neurological: alert to self. Limited secondary to encephalopathy to assess upper and lower extremities. Does not follow commands Skin: cool, diffuse swelling of extremities  Filed Weights   02/11/2022 2116  Weight: 130 kg     Intake/Output Summary (Last 24 hours) at 01/22/2022 1154 Last data filed at 01/22/2022  0600 Gross per 24 hour  Intake 200 ml  Output 325 ml  Net -125 ml   Net IO Since Admission: 125 mL [01/22/22 1154]  Pertinent Labs:    Latest Ref Rng & Units 01/22/2022    1:00 AM 01/21/2022    4:12 AM 01/24/2022   10:07 PM  CBC  WBC 4.0 - 10.5 K/uL 22.8  17.0    Hemoglobin 12.0 - 15.0 g/dL 9.1  10.4  10.5    10.2   Hematocrit 36.0 - 46.0 % 29.1  32.5  31.0    30.0   Platelets 150 - 400 K/uL 174  165         Latest Ref Rng & Units 01/22/2022    1:00 AM 01/21/2022    4:12 AM 01/15/2022   10:07 PM  CMP  Glucose 70 - 99 mg/dL 194  161  190   BUN 8 - 23 mg/dL 42  36  39   Creatinine 0.44 - 1.00 mg/dL 2.30  2.29  2.60   Sodium 135 - 145 mmol/L 138  137  134    134   Potassium 3.5 - 5.1 mmol/L 4.7  4.1  4.1    4.1   Chloride 98 - 111 mmol/L 104  100  100   CO2 22 - 32 mmol/L 21  24    Calcium 8.9 - 10.3 mg/dL 8.5  8.8    Total Protein 6.5 - 8.1 g/dL  4.7    Total Bilirubin 0.3 - 1.2 mg/dL  0.6    Alkaline Phos 38 - 126 U/L  115  AST 15 - 41 U/L  27    ALT 0 - 44 U/L  22      Imaging: DG CHEST PORT 1 VIEW  Result Date: 01/22/2022 CLINICAL DATA:  Hypoxia EXAM: PORTABLE CHEST 1 VIEW COMPARISON:  Prior chest x-ray 02/04/2022 FINDINGS: Stable cardiomegaly. Mild interstitial prominence diffusely. Similar degree of nonspecific left basilar airspace opacity. No pneumothorax. The patient is markedly rotated to the left. Overall imaging quality limited by patient's leftward rotation and body habitus with reduced penetration. IMPRESSION: Stable chest x-ray without significant interval change. Electronically Signed   By: Jacqulynn Cadet M.D.   On: 01/22/2022 07:58   MR BRAIN WO CONTRAST  Result Date: 01/22/2022 CLINICAL DATA:  Encephalopathic, right-sided weakness EXAM: MRI HEAD WITHOUT CONTRAST TECHNIQUE: Multiplanar, multiecho pulse sequences of the brain and surrounding structures were obtained without intravenous contrast. COMPARISON:  No prior MRI, correlation is made with  CT head 01/28/2022 FINDINGS: Brain: Punctate foci of restricted diffusion with ADC correlate in the left frontal lobe (series 5, images 79, 81, and 88), right parietal lobe (series 5, image 74), and right temporal lobe (series 5, image 66 and 70).No acute hemorrhage, mass, mass effect, or midline shift. No hydrocephalus or extra-axial collection.No hemosiderin deposition to suggest remote hemorrhage.Normal pituitary and craniocervical junction.Remote lacunar infarcts in the left thalamus, basal ganglia, and corona radiata. Vascular: Patent arterial flow voids. Skull and upper cervical spine: Normal marrow signal. Sinuses/Orbits: Mucosal thickening in the left maxillary sinus, left ethmoid air cells, and left frontal sinus.Bilateral exophthalmos. Other: Fluid throughout the bilateral mastoid air cells. IMPRESSION: Punctate acute infarcts in the left frontal lobe, right parietal lobe, and right temporal lobe. These results will be called to the ordering clinician or representative by the Radiologist Assistant, and communication documented in the PACS or [employer]. Electronically Signed   By: Merilyn Baba M.D.   On: 01/22/2022 03:01   DG Abd 1 View  Result Date: 01/22/2022 CLINICAL DATA:  Screening for upcoming MRI EXAM: ABDOMEN - 1 VIEW COMPARISON:  None Available. FINDINGS: Scattered large and small bowel gas is noted. No obstructive changes are seen. No radiopaque foreign bodies are noted. Degenerative changes of lumbar spine are seen. IMPRESSION: No radiopaque foreign body. Electronically Signed   By: Inez Catalina M.D.   On: 01/22/2022 00:55   [place] EKG SITE RITE  Result Date: 01/21/2022 If Site Rite image not attached, placement could not be confirmed due to current cardiac rhythm.   Assessment/Plan:   Principal Problem:   AKI (acute kidney injury) ([place]) Active Problems:   Essential hypertension   Diabetes ([place])   Stage 3a chronic kidney disease (CKD) ([place])   Morbid obesity with BMI of  45.0-49.9, adult ([place])   Type 2 diabetes mellitus treated with insulin ([place])   Pneumonia of right lung due to infectious organism   Sepsis with encephalopathy without septic shock ([place])   Sepsis ([place])   Patient Summary: Ms. Lauren Webb is a 76 y/o female with a pmh of CKD stage IV,Degeneration of lumbar intervertebral disc ,Diabetes ,Hyperlipidemia ,Essential hypertension ,Mild intermittent asthma, uncomplicated Spondylolisthesis of lumbosacral region, OSA with BiPAP QHS,oropharyngeal dysphagia who presents from rehab for confusion, found to be encephalopathic secondary to sepsis. Now with worsening acute hypoxic respiratory failure and sepsis.   Sepsis likely secondary to aspiration pneumonia vs pneumonitis Continues to have elevation of wbc, transitioned to zosyn yesterday however have had difficulty administering with only one IV access and need for IV fluids. Will transition to cefepime as shorter  run time. Will continue IV fluid bolus, but overall 5L last 24 hours and continues to third space now with anasarca. Do not believe giving IV albumin will be helpful. Dr. Loanne Drilling from PCCM evaluated and we will continue goals of care conversation with nephew once he arrives at bedside.  -appreciate PCCM assistance -continue cefepime day 1, abx day 2 -blood cultures no growth under 24 hours -respiratory support as per below  Acute hypoxic and hypercarbic respiratory failure  Patient with worsening hypotension and respiratory failure requiring increasing amounts of supplemental oxygen via ventura mask. ABG consistent with hypercarbia and hypoxia. She is not a candidate for BiPAP with encephalopathy and patient now DNR/DNI. With amount of fluid resuscitation she is needing likely will need increasing amounts of oxygen. Ongoing goals of care with family, hold off on central access.  -continue supplemental O2 -not candidate for Bipap, DNI -poor prognosis  Multiple acute punctate infarcts Overnight found  to have left sided facial droop and favoring right side of body. MRI found to have multiple punctate acute infarcts in left frontal lobe, right parietal lobe, and right temporal lobe. Hard to assess neurological status with encephalopathy. With bilateral PE's wondering if PFO present -appreciate neurology consultation -continues goal of care before further pursing imaging.  Goals of care Overall poor prognosis, family transitined to DNR/DNI but would like continue care otherwise. Will continue to have ongoing discussions as believe with continued fluid resuscitation will eventually lead to worsening hypoxia  -appreicate palliative assistance  AKI on CKD IV Likely pre-renal, continue IV fluids -daily BMP  Sacral and LLE wounds Continue wound care. Wound care nurse not available over weekends, will give recommendations via pictures  Hx of bilateral PE 12/2021. -Hold Apixaban since patient failed swallow study -Heparin GGT for anticoagulation   HTN -hold home blood pressure medicines in the setting of hypotension   T2DM Holding patient's home insulin regimen Will start on sliding scale   Chronic Medical Conditions:NPO, holding all meds Gout-Allopurinol '300mg'$  daily HLD-Rosuvastatin '20mg'$  daily Asthma-Montelukast '10mg'$  QHS, Albuterol 2 puffs every 6 hours as needed Duonebs every 6 hours as needed GERD-Omeprazole '20mg'$  daily Torsemide '10mg'$  daily  Diet: NPO IVF: LR,Boluses to keep MAP over 60 VTE: lovenox Code: DNR/DNI  Family Update: Spoke with nephew Lauren Webb, HCOPA today. Transitioned to DNR/DNI. Will have ongoing discussions today in regards to further care. She is high risk for decompensation with needing continuous fluids and increasing O2 requirements. She is not a candidate for non-invasive respiratory therapy with her encephalopathy  Dispo: Anticipated discharge pending further [place] conversations  [facility] Resident PGY-3 Please contact the on  call pager after 5 pm and on weekends at [phone].

## 2022-01-25 LAB — HEMOGLOBIN A1C
Hgb A1c MFr Bld: 6.6 % — ABNORMAL HIGH (ref 4.8–5.6)
Mean Plasma Glucose: 143 mg/dL

## 2022-01-26 LAB — CULTURE, BLOOD (ROUTINE X 2)
Culture: NO GROWTH
Culture: NO GROWTH
Special Requests: ADEQUATE
Special Requests: ADEQUATE

## 2022-02-13 NOTE — Progress Notes (Signed)
Post mortem care done, patient belonging sent along with the patient body.

## 2022-02-13 NOTE — Progress Notes (Signed)
20 ML of dilaudid wasted, witnessed by Tim,RN

## 2022-02-13 NOTE — Death Summary Note (Addendum)
DEATH SUMMARY   Patient Details  Name: Lauren Webb MRN: [id number] DOB: Apr 08, 1945 NWG:NFAOZHY, Amie, PA-C  Admission/Discharge Information   Admit Date:  February 07, 2022  Date of Death: Date of Death: 02-10-2022  Time of Death: Time of Death: 72  Length of Stay: 2   Principle Cause of death:  Acute respiratory failure secondary to multiorgan failure in the setting of sepsis likely due to aspiration pneumonia versus pneumonitis  Hospital Diagnoses: Principal Problem:   AKI (acute kidney injury) ([employer]) Active Problems:   Essential hypertension   Diabetes ([place])   Stage 3a chronic kidney disease (CKD) ([place])   Morbid obesity with BMI of 45.0-49.9, adult ([place])   Type 2 diabetes mellitus treated with insulin ([place])   Pneumonia of right lung due to infectious organism   Sepsis with encephalopathy without septic shock ([place])   Sepsis [facility])   Hospital Course:  Patient presented from [facility] with acute encephalopathy for several days. She had CT head performed in the ED which showed no acute intracranial process but did reveal remote lacunar infarcts and stable exophthalmos.  Chest x-ray obtained in the ED showed groundglass opacity in right lung.  Patient was noted to be tachycardic and tachypneic and hypertensive with leukocytosis, admitted with concern for sepsis with development of septic shock.  He did receive dose of ceftriaxone and azithromycin in the ED.  Of note patient had had recent urinary tract infection, UTI as underlying etiology was ruled out with negative UA.  Pro-Cal was checked which returned elevated indicative of bacterial lower respiratory tract infection.  She was continued on antibiotics with initial coverage with Unasyn for aspiration, later expanded to Zosyn due to risk for Pseudomonas given recent hospitalizations.  She received fluid resuscitation fluid resuscitation.  Patient was initially admitted as observation status, however given her  overall high risk features, she was upgraded to progressive care.  After initial stabilization of the patient, called family who reported that patient was partial code with no chest compressions or shock requested.  Patient was noted to be very encephalopathic so that her home medications/oral medications were held.  She had history of PE and is anticoagulated with full dose Lovenox.  She was noted by nursing staff at shift change that patient had developed new onset left-sided facial droop and had increased oxygen requirement with tachypnea and accessory muscle use, requiring 2 L nasal cannula to maintain sats greater than 95%.  These findings were different compared to evaluation earlier in the day.  Brain MRI was ordered and neurology was consulted due to concerns for acute cerebrovascular accident.  MRI returned showing multiple punctate infarcts scattered throughout bilaterally.  Throughout the night patient was noted to have hypotension requiring IV fluid boluses with increasing O2 requirement 15L Venturi mask. PCCM was consulted and recommended palliative consult and [place] discussions with family. Patient was transitioned to DNR/DNI and full comfort measures. Passed 0300 10-Feb-2022.    Assessment and Plan: Sepsis with septic shock likely secondary to aspiration pneumonia. Was treated with Ceftriaxone/ Azithromycin then upgraded to Zosyn, later transitioned to cefepime for shorter run time given limited IV access. Held off on central access since [place] were ongoing. PCCM evaluated and recommended [place] discussions.   Acute hypoxic and hypercarbic respiratory failure Required venturi mask. Abg was consistent with hypercarbia and hypoxia. Was not a candidate for bipap with encephalopathy.   Multiple acute punctate infarcts. Mri found punctate infarcts in left frontal lobe, right parietal, and right temporal lobe.  Neurology was consulted, recommended stroke workup. Further workup postponed given [place] discussions.        Procedures: none  Consultations: palliative, PCCM, neurology  The results of significant diagnostics from this hospitalization (including imaging, microbiology, ancillary and laboratory) are listed below for reference.   Significant Diagnostic Studies: ECHOCARDIOGRAM COMPLETE  Result Date: 01/22/2022    ECHOCARDIOGRAM REPORT   Patient Name:   MAKYLA BYE Date of Exam: 01/22/2022 Medical Rec #:  [id number]      Height:       64.0 in Accession #:    [id number]     Weight:       286.6 lb Date of Birth:  Dec 08, 1945      BSA:          2.280 m Patient Age:    77 years       BP:           110/68 mmHg Patient Gender: F              HR:           67 bpm. Exam Location:  Inpatient Procedure: 2D Echo, Cardiac Doppler and Color Doppler Indications:    Stroke I63.9  History:        Patient has prior history of Echocardiogram examinations, most                 recent 12/15/2021. Stroke; Risk Factors:Diabetes, Hypertension,                 Dyslipidemia and Non-Smoker.  Sonographer:    Wilkie Aye RVT RCS Referring Phys: [id number] [place]  Sonographer Comments: Technically challenging study due to limited acoustic windows. Image acquisition challenging due to respiratory motion and Image acquisition challenging due to patient body habitus. IMPRESSIONS  1. Left ventricular ejection fraction, by estimation, is 65 to 70%. The left ventricle has normal function. The left ventricle has no regional wall motion abnormalities. Left ventricular diastolic parameters are consistent with Grade I diastolic dysfunction (impaired relaxation).  2. Right ventricular systolic function is normal. The right ventricular size is normal. Tricuspid regurgitation signal is inadequate for assessing PA pressure.  3. The mitral valve is normal in structure. No evidence of mitral valve regurgitation.  4. The aortic valve is tricuspid. There is mild calcification of the aortic valve. There is mild thickening of the aortic valve.  Aortic valve regurgitation is not visualized. Aortic valve sclerosis/calcification is present, without any evidence of aortic stenosis.  5. The inferior vena cava is normal in size with greater than 50% respiratory variability, suggesting right atrial pressure of 3 mmHg. Comparison(s): 12/15/21-EF 60-65%. No significant change on current study. Conclusion(s)/Recommendation(s): No intracardiac source of embolism detected on this transthoracic study. Consider a transesophageal echocardiogram to exclude cardiac source of embolism if clinically indicated. FINDINGS  Left Ventricle: Left ventricular ejection fraction, by estimation, is 65 to 70%. The left ventricle has normal function. The left ventricle has no regional wall motion abnormalities. The left ventricular internal cavity size was normal in size. There is  no left ventricular hypertrophy. Left ventricular diastolic parameters are consistent with Grade I diastolic dysfunction (impaired relaxation). Right Ventricle: The right ventricular size is normal. No increase in right ventricular wall thickness. Right ventricular systolic function is normal. Tricuspid regurgitation signal is inadequate for assessing PA pressure. Left Atrium: Left atrial size was normal in size. Right Atrium: Right atrial size was normal in size. Pericardium: There is no evidence of pericardial effusion. Presence of  epicardial fat layer. Mitral Valve: The mitral valve is normal in structure. No evidence of mitral valve regurgitation. Tricuspid Valve: The tricuspid valve is normal in structure. Tricuspid valve regurgitation is not demonstrated. Aortic Valve: The aortic valve is tricuspid. There is mild calcification of the aortic valve. There is mild thickening of the aortic valve. Aortic valve regurgitation is not visualized. Aortic valve sclerosis/calcification is present, without any evidence of aortic stenosis. Aortic valve mean gradient measures 10.0 mmHg. Aortic valve peak gradient  measures 20.1 mmHg. Aortic valve area, by VTI measures 1.87 cm. Pulmonic Valve: The pulmonic valve was normal in structure. Pulmonic valve regurgitation is trivial. Aorta: The aortic root and ascending aorta are structurally normal, with no evidence of dilitation. Venous: The inferior vena cava is normal in size with greater than 50% respiratory variability, suggesting right atrial pressure of 3 mmHg. IAS/Shunts: The atrial septum is grossly normal.  LEFT VENTRICLE PLAX 2D LVIDd:         4.70 cm   Diastology LVIDs:         3.00 cm   LV e' medial:  9.36 cm/s LV PW:         1.00 cm   LV e' lateral: 9.46 cm/s LV IVS:        1.00 cm LVOT diam:     1.90 cm LV SV:         75 LV SV Index:   33 LVOT Area:     2.84 cm  RIGHT VENTRICLE             IVC RV S prime:     13.50 cm/s  IVC diam: 2.00 cm TAPSE (M-mode): 1.8 cm LEFT ATRIUM           Index        RIGHT ATRIUM           Index LA diam:      3.50 cm 1.54 cm/m   RA Area:     16.30 cm LA Vol (A2C): 34.3 ml 15.05 ml/m  RA Volume:   43.00 ml  18.86 ml/m LA Vol (A4C): 32.0 ml 14.04 ml/m  AORTIC VALVE                     PULMONIC VALVE AV Area (Vmax):    2.08 cm      PV Vmax:       1.50 m/s AV Area (Vmean):   2.13 cm      PV Peak grad:  9.0 mmHg AV Area (VTI):     1.87 cm AV Vmax:           224.00 cm/s AV Vmean:          141.000 cm/s AV VTI:            0.401 m AV Peak Grad:      20.1 mmHg AV Mean Grad:      10.0 mmHg LVOT Vmax:         164.00 cm/s LVOT Vmean:        106.000 cm/s LVOT VTI:          0.264 m LVOT/AV VTI ratio: 0.66  AORTA Ao Root diam: 2.90 cm Ao Asc diam:  2.80 cm TRICUSPID VALVE TR Peak grad:   7.7 mmHg TR Vmax:        139.00 cm/s  SHUNTS Systemic VTI:  0.26 m Systemic Diam: 1.90 cm Gwyndolyn Kaufman MD Electronically signed by Gwyndolyn Kaufman MD Signature Date/Time: 01/22/2022/3:15:14  PM    Final    DG CHEST PORT 1 VIEW  Result Date: 01/22/2022 CLINICAL DATA:  Hypoxia EXAM: PORTABLE CHEST 1 VIEW COMPARISON:  Prior chest x-ray 01/17/2022  FINDINGS: Stable cardiomegaly. Mild interstitial prominence diffusely. Similar degree of nonspecific left basilar airspace opacity. No pneumothorax. The patient is markedly rotated to the left. Overall imaging quality limited by patient's leftward rotation and body habitus with reduced penetration. IMPRESSION: Stable chest x-ray without significant interval change. Electronically Signed   By: Jacqulynn Cadet M.D.   On: 01/22/2022 07:58   MR BRAIN WO CONTRAST  Result Date: 01/22/2022 CLINICAL DATA:  Encephalopathic, right-sided weakness EXAM: MRI HEAD WITHOUT CONTRAST TECHNIQUE: Multiplanar, multiecho pulse sequences of the brain and surrounding structures were obtained without intravenous contrast. COMPARISON:  No prior MRI, correlation is made with CT head 02/03/2022 FINDINGS: Brain: Punctate foci of restricted diffusion with ADC correlate in the left frontal lobe (series 5, images 79, 81, and 88), right parietal lobe (series 5, image 74), and right temporal lobe (series 5, image 66 and 70).No acute hemorrhage, mass, mass effect, or midline shift. No hydrocephalus or extra-axial collection.No hemosiderin deposition to suggest remote hemorrhage.Normal pituitary and craniocervical junction.Remote lacunar infarcts in the left thalamus, basal ganglia, and corona radiata. Vascular: Patent arterial flow voids. Skull and upper cervical spine: Normal marrow signal. Sinuses/Orbits: Mucosal thickening in the left maxillary sinus, left ethmoid air cells, and left frontal sinus.Bilateral exophthalmos. Other: Fluid throughout the bilateral mastoid air cells. IMPRESSION: Punctate acute infarcts in the left frontal lobe, right parietal lobe, and right temporal lobe. These results will be called to the ordering clinician or representative by the Radiologist Assistant, and communication documented in the PACS or [employer]. Electronically Signed   By: Merilyn Baba M.D.   On: 01/22/2022 03:01   DG Abd 1  View  Result Date: 01/22/2022 CLINICAL DATA:  Screening for upcoming MRI EXAM: ABDOMEN - 1 VIEW COMPARISON:  None Available. FINDINGS: Scattered large and small bowel gas is noted. No obstructive changes are seen. No radiopaque foreign bodies are noted. Degenerative changes of lumbar spine are seen. IMPRESSION: No radiopaque foreign body. Electronically Signed   By: Inez Catalina M.D.   On: 01/22/2022 00:55   [place] EKG SITE RITE  Result Date: 01/21/2022 If Site Rite image not attached, placement could not be confirmed due to current cardiac rhythm.  US RENAL  Result Date: 01/21/2022 CLINICAL DATA:  Acute kidney injury EXAM: RENAL / URINARY TRACT ULTRASOUND COMPLETE COMPARISON:  None Available. FINDINGS: Right Kidney: Renal measurements: 10.1 x 4.7 x 5.2 cm = volume: 123.4 mL. Diffuse renal cortical thinning with lipomatous hypertrophy of the renal hilum. Circumscribed 1.0 x 1.3 x 1.2 cm anechoic simple cyst. No further imaging follow-up recommended. No hydronephrosis. Echogenic renal cortex. Left Kidney: Renal measurements: 10.2 x 4.8 x 6.2 = volume: 159 mL. Diffuse renal cortical thinning with lipomatous hypertrophy of the renal hilum. Echogenic renal cortex. No hydronephrosis. No mass. Bladder: Appears normal for degree of bladder distention. Other: None. IMPRESSION: 1. No evidence of hydronephrosis. 2. Marked renal cortical thinning bilaterally. 3. Echogenic renal parenchyma suggests underlying medical renal disease. 4. Small right renal simple cyst.  No further imaging follow-up. Electronically Signed   By: Jacqulynn Cadet M.D.   On: 01/21/2022 06:35   CT CHEST WO CONTRAST  Result Date: 01/21/2022 CLINICAL DATA:  Asymmetric airspace opacity on recent chest x-ray EXAM: CT CHEST WITHOUT CONTRAST TECHNIQUE: Multidetector CT imaging of the chest was performed following the  standard protocol without IV contrast. RADIATION DOSE REDUCTION: This exam was performed according to the departmental  dose-optimization program which includes automated exposure control, adjustment of the mA and/or kV according to patient size and/or use of iterative reconstruction technique. COMPARISON:  Chest x-ray from the previous day. FINDINGS: Cardiovascular: Somewhat limited due to lack of IV contrast. Atherosclerotic calcifications are noted. No aneurysmal dilatation is noted. Mild coronary calcifications are seen. Pulmonary artery as visualized is within normal limits. No cardiac enlargement is seen. Mediastinum/Nodes: Esophagus is within normal limits. No hilar or mediastinal adenopathy is noted. The thoracic inlet is unremarkable. Lungs/Pleura: Lungs are well aerated bilaterally. Bibasilar atelectatic changes are noted. No focal confluent infiltrate is seen. No sizable parenchymal nodules are noted. The parenchymal density seen on recent chest x-ray is not borne out on this exam and likely related to rotation. Upper Abdomen: Within normal limits. Musculoskeletal: Degenerative changes of the thoracic spine are noted. No acute rib abnormality is seen. IMPRESSION: Mild bibasilar atelectatic changes. The parenchymal density seen in the right lung on the recent chest x-ray is not borne out on this exam and likely related to patient rotation. No other acute abnormality noted. Aortic Atherosclerosis (ICD10-I70.0). Electronically Signed   By: Inez Catalina M.D.   On: 01/21/2022 01:40   CT HEAD WO CONTRAST  Result Date: 01/13/2022 CLINICAL DATA:  Altered mental status EXAM: CT HEAD WITHOUT CONTRAST TECHNIQUE: Contiguous axial images were obtained from the base of the skull through the vertex without intravenous contrast. RADIATION DOSE REDUCTION: This exam was performed according to the departmental dose-optimization program which includes automated exposure control, adjustment of the mA and/or kV according to patient size and/or use of iterative reconstruction technique. COMPARISON:  01/08/2022 FINDINGS: Brain: Normal  anatomic configuration. Parenchymal volume loss is commensurate with the patient's age. Mild periventricular white matter changes are present likely reflecting the sequela of small vessel ischemia. 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 No abnormal intra or extra-axial mass lesion or fluid collection. No abnormal mass effect or midline shift. No evidence of acute intracranial hemorrhage or infarct. Ventricular size is normal. Cerebellum unremarkable. Vascular: No asymmetric hyperdense vasculature at the skull base. Skull: Intact Sinuses/Orbits: There is dense opacification of the left frontal sinus and several left ethmoid air cells, similar to prior examination. Small polyp within the left maxillary sinus again noted. Remaining paranasal sinuses are clear. Stable bilateral exophthalmos. Orbits are otherwise unremarkable. Other: There has developed fluid opacification of the left mastoid air cells. No superimposed osseous erosion. Middle ear cavities and right mastoid air cells are clear. IMPRESSION: 1. No acute intracranial abnormality. 2. Stable senescent changes and remote lacunar infarcts. 3. Interval development of left mastoid effusion. 4. Stable left frontal and ethmoid sinus disease. 5. Stable bilateral exophthalmos. Electronically Signed   By: Fidela Salisbury M.D.   On: 01/30/2022 23:16   DG Chest 1 View  Result Date: 01/25/2022 CLINICAL DATA:  Altered mental status EXAM: CHEST  1 VIEW COMPARISON:  Chest radiograph and CT of 01/07/2022 FINDINGS: Stable mild elevation of the left hemidiaphragm. Asymmetric ground-glass opacity throughout the right lung has developed, possibly infectious or inflammatory in nature. Mild infiltrate is also noted within the retrocardiac region. No pneumothorax or pleural effusion. Bulbous appearance of the aortic knob is stable and related to the left pulmonary artery better seen on  prior CT examination. Cardiac size within normal limits. No acute bone abnormality. IMPRESSION: Interval development of  asymmetric ground-glass opacity throughout the right lung, possibly infectious or inflammatory in the acute setting. Stable pulmonary hypoinflation. Electronically Signed   By: Fidela Salisbury M.D.   On: 02/12/2022 23:08    Microbiology: Recent Results (from the past 240 hour(s))  Resp panel by RT-PCR (RSV, Flu A&B, Covid) Anterior Nasal Swab     Status: None   Collection Time: 01/19/2022  9:34 PM   Specimen: Anterior Nasal Swab  Result Value Ref Range Status   SARS Coronavirus 2 by RT PCR NEGATIVE NEGATIVE Final    Comment: (NOTE) SARS-CoV-2 target nucleic acids are NOT DETECTED.  The SARS-CoV-2 RNA is generally detectable in upper respiratory specimens during the acute phase of infection. The lowest concentration of SARS-CoV-2 viral copies this assay can detect is 138 copies/mL. A negative result does not preclude SARS-Cov-2 infection and should not be used as the sole basis for treatment or other patient management decisions. A negative result may occur with  improper specimen collection/handling, submission of specimen other than nasopharyngeal swab, presence of viral mutation(s) within the areas targeted by this assay, and inadequate number of viral copies(<138 copies/mL). A negative result must be combined with clinical observations, patient history, and epidemiological information. The expected result is Negative.  Fact Sheet for Patients:  EntrepreneurPulse.com.au  Fact Sheet for Healthcare Providers:  IncredibleEmployment.be  This test is no t yet approved or cleared by the [place] FDA and  has been authorized for detection and/or diagnosis of SARS-CoV-2 by FDA under an Emergency Use Authorization (EUA). This EUA will remain  in effect (meaning this test can be used) for the duration of the COVID-19 declaration under  Section 564(b)(1) of the Act, 21 U.S.C.section 360bbb-3(b)(1), unless the authorization is terminated  or revoked sooner.       Influenza A by PCR NEGATIVE NEGATIVE Final   Influenza B by PCR NEGATIVE NEGATIVE Final    Comment: (NOTE) The Xpert Xpress SARS-CoV-2/FLU/RSV plus assay is intended as an aid in the diagnosis of influenza from Nasopharyngeal swab specimens and should not be used as a sole basis for treatment. Nasal washings and aspirates are unacceptable for Xpert Xpress SARS-CoV-2/FLU/RSV testing.  Fact Sheet for Patients: EntrepreneurPulse.com.au  Fact Sheet for Healthcare Providers: IncredibleEmployment.be  This test is not yet approved or cleared by the [place] FDA and has been authorized for detection and/or diagnosis of SARS-CoV-2 by FDA under an Emergency Use Authorization (EUA). This EUA will remain in effect (meaning this test can be used) for the duration of the COVID-19 declaration under Section 564(b)(1) of the Act, 21 U.S.C. section 360bbb-3(b)(1), unless the authorization is terminated or revoked.     Resp Syncytial Virus by PCR NEGATIVE NEGATIVE Final    Comment: (NOTE) Fact Sheet for Patients: EntrepreneurPulse.com.au  Fact Sheet for Healthcare Providers: IncredibleEmployment.be  This test is not yet approved or cleared by the [place] FDA and has been authorized for detection and/or diagnosis of SARS-CoV-2 by FDA under an Emergency Use Authorization (EUA). This EUA will remain in effect (meaning this test can be used) for the duration of the COVID-19 declaration under Section 564(b)(1) of the Act, 21 U.S.C. section 360bbb-3(b)(1), unless the authorization is terminated or revoked.  Performed at [facility], [place] [address] [place],  [address]   Respiratory (~20 pathogens) panel by PCR     Status: None   Collection Time: 01/24/2022  9:34 PM    Specimen: Nasopharyngeal Swab; Respiratory  Result Value Ref Range Status   Adenovirus  NOT DETECTED NOT DETECTED Final   Coronavirus 229E NOT DETECTED NOT DETECTED Final    Comment: (NOTE) The Coronavirus on the Respiratory Panel, DOES NOT test for the novel  Coronavirus (2019 nCoV)    Coronavirus HKU1 NOT DETECTED NOT DETECTED Final   Coronavirus NL63 NOT DETECTED NOT DETECTED Final   Coronavirus OC43 NOT DETECTED NOT DETECTED Final   Metapneumovirus NOT DETECTED NOT DETECTED Final   Rhinovirus / Enterovirus NOT DETECTED NOT DETECTED Final   Influenza A NOT DETECTED NOT DETECTED Final   Influenza B NOT DETECTED NOT DETECTED Final   Parainfluenza Virus 1 NOT DETECTED NOT DETECTED Final   Parainfluenza Virus 2 NOT DETECTED NOT DETECTED Final   Parainfluenza Virus 3 NOT DETECTED NOT DETECTED Final   Parainfluenza Virus 4 NOT DETECTED NOT DETECTED Final   Respiratory Syncytial Virus NOT DETECTED NOT DETECTED Final   Bordetella pertussis NOT DETECTED NOT DETECTED Final   Bordetella Parapertussis NOT DETECTED NOT DETECTED Final   Chlamydophila pneumoniae NOT DETECTED NOT DETECTED Final   Mycoplasma pneumoniae NOT DETECTED NOT DETECTED Final    Comment: Performed at [facility], [place] [address], [place], [place] [address]  MRSA Next Gen by PCR, Nasal     Status: None   Collection Time: 01/21/22  1:12 PM   Specimen: Nasal Mucosa; Nasal Swab  Result Value Ref Range Status   MRSA by PCR Next Gen NOT DETECTED NOT DETECTED Final    Comment: (NOTE) The GeneXpert MRSA Assay (FDA approved for NASAL specimens only), is one component of a comprehensive MRSA colonization surveillance program. It is not intended to diagnose MRSA infection nor to guide or monitor treatment for MRSA infections. Test performance is not FDA approved in patients less than 67 years old. Performed at [facility], [place] [address], [place], [place] [address]   Culture, blood (Routine X 2) w Reflex to ID Panel      Status: None (Preliminary result)   Collection Time: 01/21/22  3:31 PM   Specimen: BLOOD LEFT HAND  Result Value Ref Range Status   Specimen Description BLOOD LEFT HAND  Final   Special Requests   Final    BOTTLES DRAWN AEROBIC AND ANAEROBIC Blood Culture adequate volume   Culture   Final    NO GROWTH < 24 HOURS Performed at [facility], [place] [address], [place], [place] [address]    Report Status PENDING  Incomplete  Culture, blood (Routine X 2) w Reflex to ID Panel     Status: None (Preliminary result)   Collection Time: 01/21/22  3:40 PM   Specimen: BLOOD LEFT WRIST  Result Value Ref Range Status   Specimen Description BLOOD LEFT WRIST  Final   Special Requests   Final    BOTTLES DRAWN AEROBIC AND ANAEROBIC Blood Culture adequate volume   Culture   Final    NO GROWTH < 24 HOURS Performed at [facility], [place] [address] [place], [place] [address]    Report Status PENDING  Incomplete    Time spent: 35 minutes  Signed: Delene Ruffini, MD 2022/01/30

## 2022-02-13 NOTE — Progress Notes (Signed)
Patient died at 47 am,confirmed by two RN Shareece Bultman,RN Mardene Celeste black,RN.MD notified,Family notified.Waiting for the family to come.[place] [facility] called,reference number [phone].

## 2022-02-13 DEATH — deceased

## 2022-08-16 IMAGING — MR MR LUMBAR SPINE W/O CM
4 of 5 series · 18 of 48 positions shown · non-contrast
Comparison: Lumbar spine radiographs 07/30/2019. Lumbar spine MRI
07/01/2016

CLINICAL DATA: Low back pain with bilateral hip and leg pain.

EXAM:
MRI LUMBAR SPINE WITHOUT CONTRAST
TECHNIQUE: Multiplanar, multisequence MR imaging of the lumbar spine was
performed. No intravenous contrast was administered.

[Series 9: T2 · sagittal · 4.0mm · 0.73mm/px · 6 of 15 slices shown (1 of 2)]
[im 1/15]
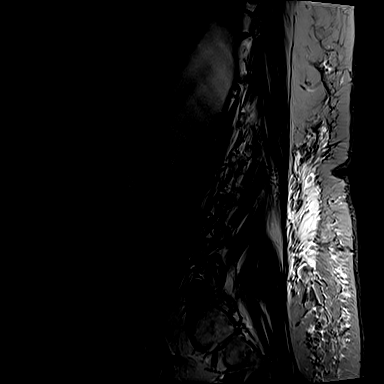
[im 3/15]
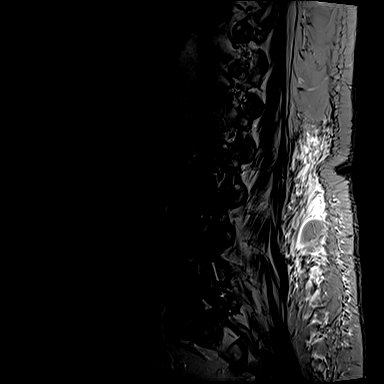
[im 6/15]
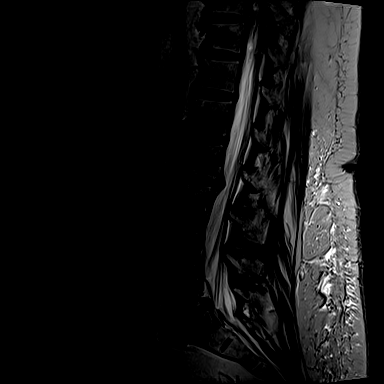
[im 9/15]
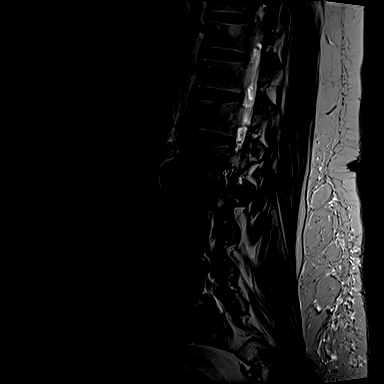
[im 12/15]
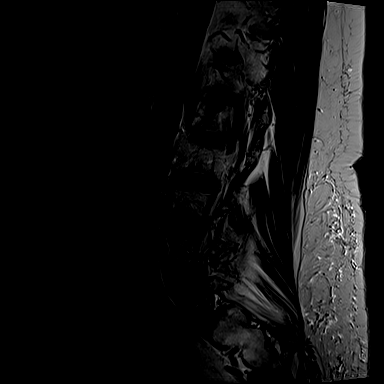
[im 15/15]
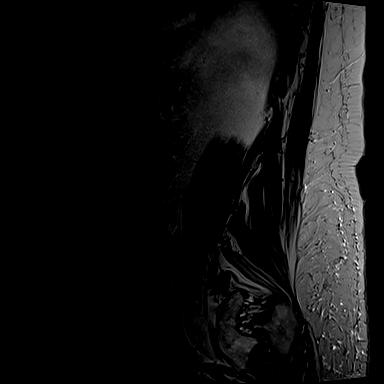

[Series 10: T1 · sagittal · 4.0mm · 0.73mm/px · 3 of 15 slices shown (1 of 2)]
[im 3/15]
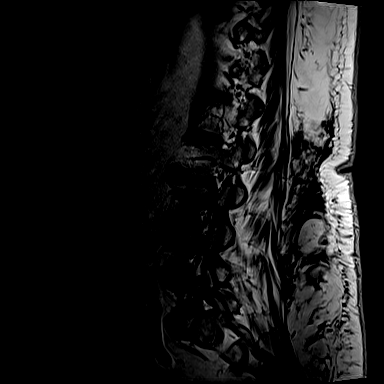
[im 9/15]
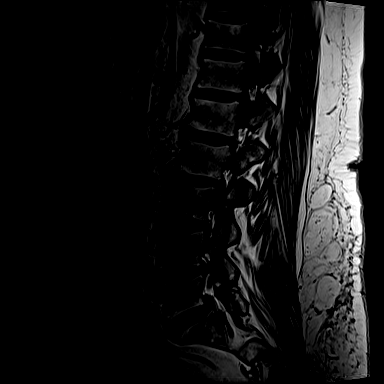
[im 15/15]
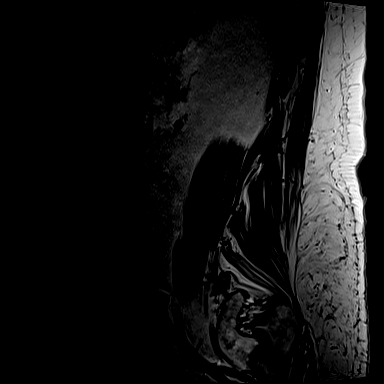

[Series 100: T1 · axial · 4.0mm · 0.28mm/px · z∈[-232,-67]mm · 3 of 40 slices shown (2 of 2)]
[im 6/40]
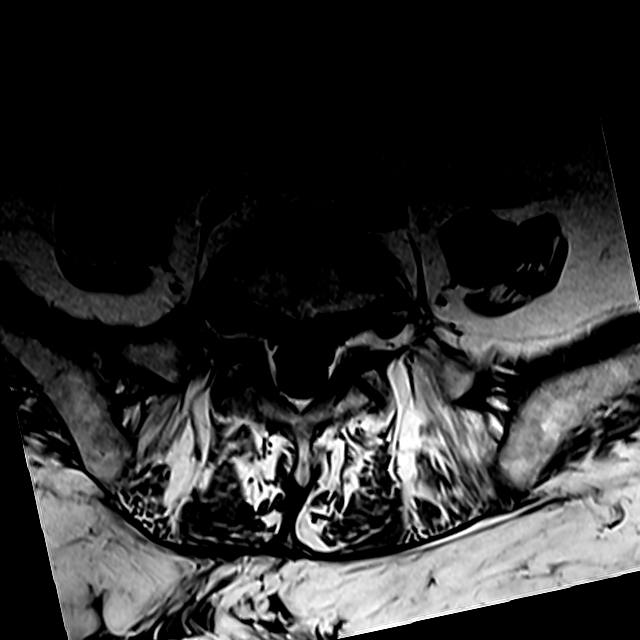
[im 20/40]
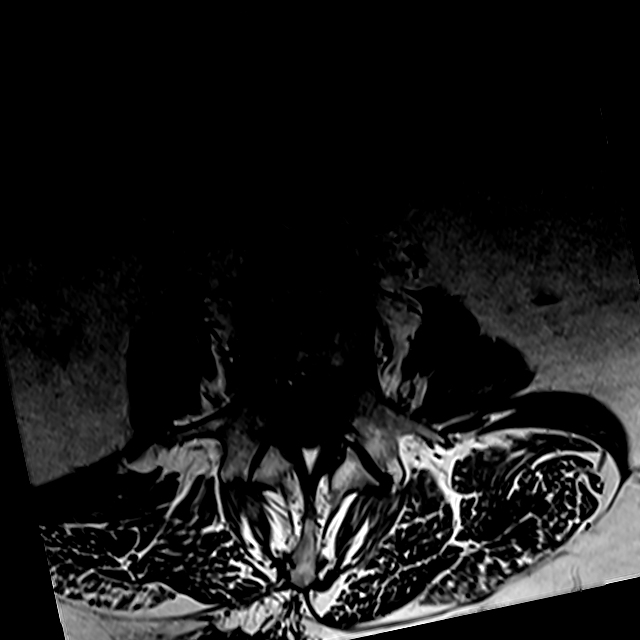
[im 34/40]
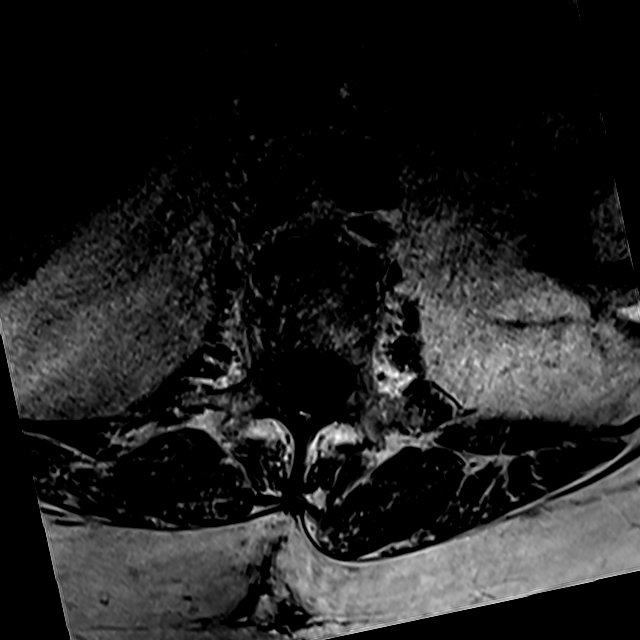

[Series 101: T2 · axial · 4.0mm · 0.28mm/px · z∈[-256,-67]mm · 6 of 40 slices shown (2 of 2)]
[im 1/40]
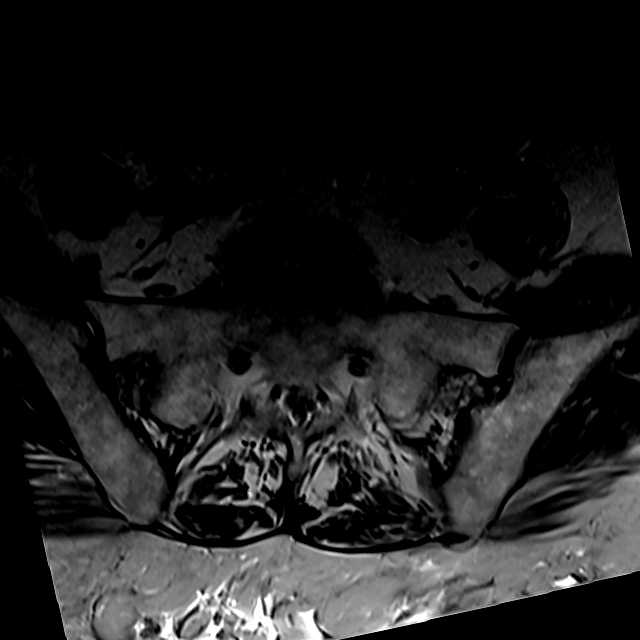
[im 6/40]
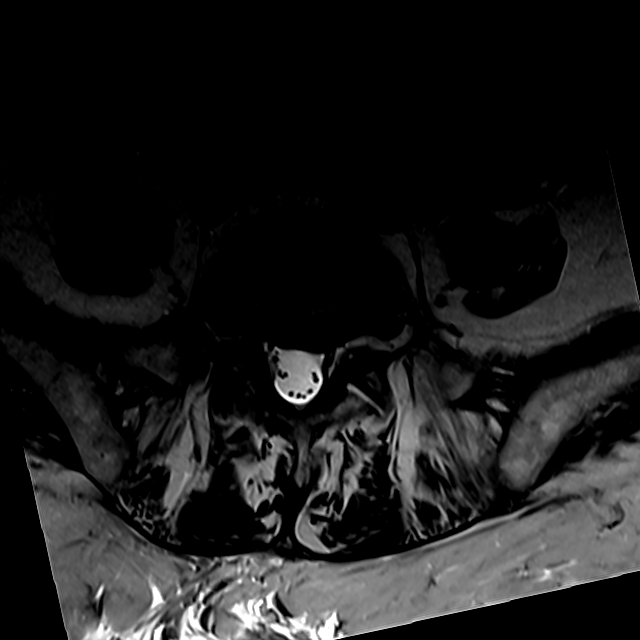
[im 12/40]
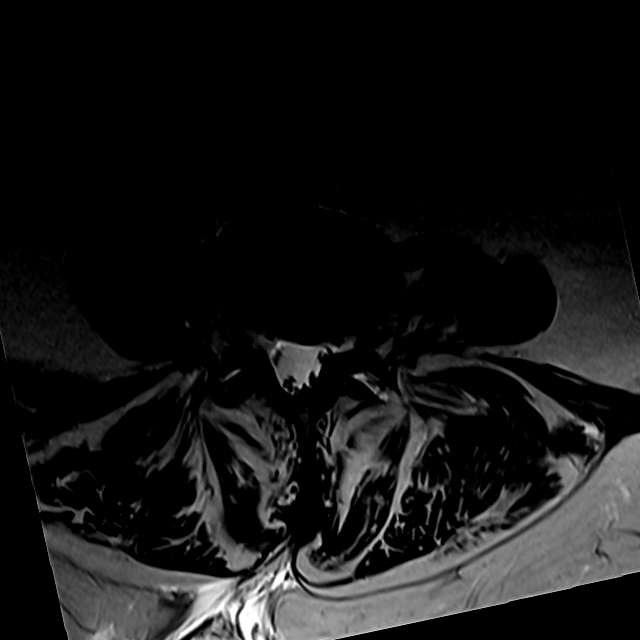
[im 17/40]
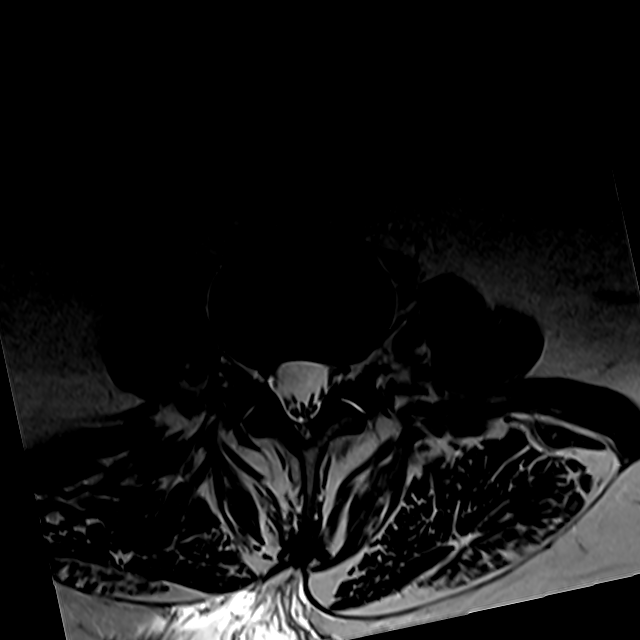
[im 20/40]
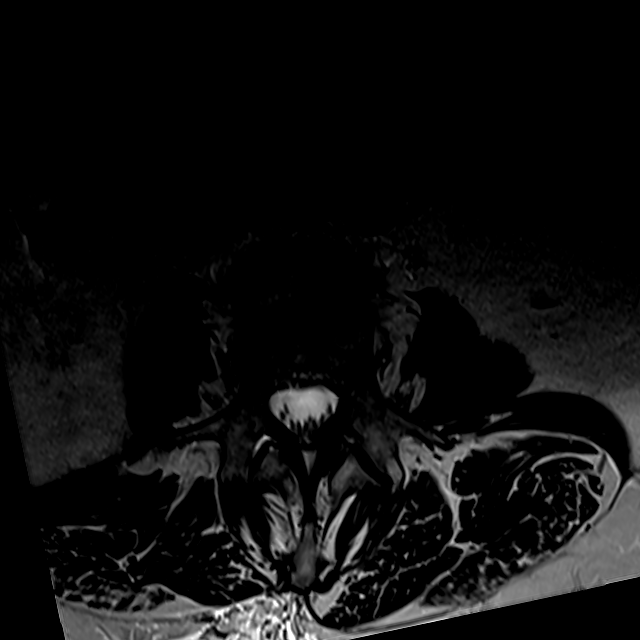
[im 34/40]
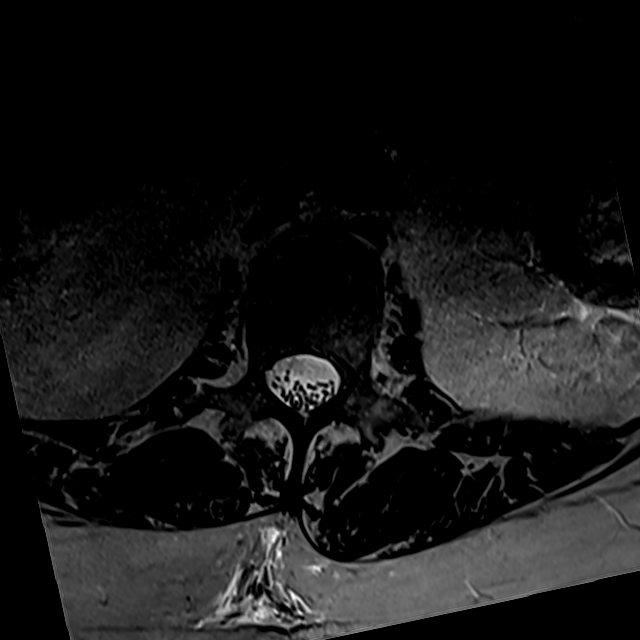

[18 of 48 positions shown; findings below may reference images not displayed]

FINDINGS: Segmentation:  Normal

Alignment:  Slight anterolisthesis L5-S1 otherwise normal alignment

Vertebrae: Normal bone marrow. Negative for fracture or mass.
Bilateral pars defects of L5, chronic and unchanged.

Conus medullaris and cauda equina: Conus extends to the T12-L1
level. Conus and cauda equina appear normal.

Paraspinal and other soft tissues: Negative for paraspinous mass or
adenopathy.

Disc levels:

T12-L1: Negative

L1-2: Negative

L2-3: Disc degeneration with disc space narrowing and disc bulging.
Small paracentral disc protrusion flattening the thecal sac. Mild
facet degeneration bilaterally. Mild subarticular stenosis on the
left.

L3-4: Mild disc and mild facet degeneration. Negative for disc
protrusion or stenosis

L4-5: Disc degeneration with disc bulging. Shallow left foraminal
disc protrusion. Moderate facet degeneration with bilateral facet
joint effusions and facet hypertrophy. Left L5 nerve root
impingement in the subarticular zone. Mild right subarticular
stenosis

L5-S1: Mild anterolisthesis. Bilateral pars defects of L5. Mild to
moderate foraminal encroachment bilaterally.
IMPRESSION: Small central disc protrusion L2-3. Mild subarticular stenosis on
the left

Shallow left foraminal disc protrusion with left L5 nerve root
impingement in the subarticular zone

Chronic bilateral pars defects of L5. Mild to moderate foraminal
encroachment bilaterally.
